# Patient Record
Sex: Female | Born: 1945 | Race: White | Hispanic: No | Marital: Married | Smoking: Former smoker
Health system: Southern US, Community
[De-identification: ages and names within clinical notes are randomized; demographics above are authoritative.]

## PROBLEM LIST (undated history)

## (undated) DIAGNOSIS — IMO0002 Reserved for concepts with insufficient information to code with codable children: Secondary | ICD-10-CM

## (undated) DIAGNOSIS — I872 Venous insufficiency (chronic) (peripheral): Secondary | ICD-10-CM

## (undated) DIAGNOSIS — E1165 Type 2 diabetes mellitus with hyperglycemia: Secondary | ICD-10-CM

## (undated) DIAGNOSIS — E782 Mixed hyperlipidemia: Secondary | ICD-10-CM

## (undated) DIAGNOSIS — F419 Anxiety disorder, unspecified: Secondary | ICD-10-CM

## (undated) DIAGNOSIS — E559 Vitamin D deficiency, unspecified: Secondary | ICD-10-CM

## (undated) DIAGNOSIS — G2581 Restless legs syndrome: Secondary | ICD-10-CM

## (undated) DIAGNOSIS — J45909 Unspecified asthma, uncomplicated: Secondary | ICD-10-CM

## (undated) DIAGNOSIS — I10 Essential (primary) hypertension: Secondary | ICD-10-CM

## (undated) DIAGNOSIS — M8580 Other specified disorders of bone density and structure, unspecified site: Secondary | ICD-10-CM

## (undated) DIAGNOSIS — M797 Fibromyalgia: Secondary | ICD-10-CM

## (undated) DIAGNOSIS — E039 Hypothyroidism, unspecified: Secondary | ICD-10-CM

## (undated) DIAGNOSIS — F1110 Opioid abuse, uncomplicated: Secondary | ICD-10-CM

## (undated) HISTORY — PX: ABDOMINAL HYSTERECTOMY: SHX81

## (undated) HISTORY — PX: TONSILLECTOMY: SUR1361

## (undated) HISTORY — DX: Reserved for concepts with insufficient information to code with codable children: IMO0002

## (undated) HISTORY — PX: OTHER SURGICAL HISTORY: SHX169

## (undated) HISTORY — DX: Other specified disorders of bone density and structure, unspecified site: M85.80

## (undated) HISTORY — DX: Restless legs syndrome: G25.81

## (undated) HISTORY — DX: Type 2 diabetes mellitus with hyperglycemia: E11.65

## (undated) HISTORY — DX: Unspecified asthma, uncomplicated: J45.909

## (undated) HISTORY — DX: Anxiety disorder, unspecified: F41.9

## (undated) HISTORY — DX: Vitamin D deficiency, unspecified: E55.9

## (undated) HISTORY — DX: Mixed hyperlipidemia: E78.2

## (undated) HISTORY — DX: Essential (primary) hypertension: I10

## (undated) HISTORY — DX: Hypothyroidism, unspecified: E03.9

## (undated) HISTORY — DX: Opioid abuse, uncomplicated: F11.10

## (undated) HISTORY — DX: Venous insufficiency (chronic) (peripheral): I87.2

## (undated) HISTORY — PX: CARPAL TUNNEL RELEASE: SHX101

## (undated) HISTORY — DX: Fibromyalgia: M79.7

## (undated) NOTE — *Deleted (*Deleted)
Advice for Weight Management  -For most of us the best way to lose weight is by diet management. Generally speaking, diet management means consuming less calories intentionally which over time brings about progressive weight loss.  This can be achieved more effectively by restricting carbohydrate consumption to the minimum possible.  So, it is critically important to know your numbers: how much calorie you are consuming and how much calorie you need. More importantly, our carbohydrates sources should be unprocessed or minimally processed complex starch food items.   Sometimes, it is important to balance nutrition by increasing protein intake (animal or plant source), fruits, and vegetables.  -Sticking to a routine mealtime to eat 3 meals a day and avoiding unnecessary snacks is shown to have a big role in weight control. Under normal circumstances, the only time we lose real weight is when we are hungry, so allow hunger to take place- hunger means no food between meal times, only water.  It is not advisable to starve.   -It is better to avoid simple carbohydrates including: Cakes, Sweet Desserts, Ice Cream, Soda (diet and regular), Sweet Tea, Candies, Chips, Cookies, Store Bought Juices, Alcohol in Excess of  1-2 drinks a day, Artificial Sweeteners, Doughnuts, Coffee Creamers, "Sugar-free" Products, etc, etc.  This is not a complete list.....    -Consulting with certified diabetes educators is proven to provide you with the most accurate and current information on diet.  Also, you may be  interested in discussing diet options/exchanges , we can schedule a visit with Penny Crumpton, RDN, CDE for individualized nutrition education.  -Exercise: If you are able: 30 -60 minutes a day ,4 days a week, or 150 minutes a week.  The longer the better.  Combine stretch, strength, and aerobic activities.  If you were told in the past that you have high risk for cardiovascular diseases, you may seek evaluation by  your heart doctor prior to initiating moderate to intense exercise programs.    

---

## 2012-10-10 ENCOUNTER — Ambulatory Visit
Admission: RE | Admit: 2012-10-10 | Discharge: 2012-10-10 | Disposition: A | Discharge status: Home / Self Care | Payer: Medicare Other | Source: Ambulatory Visit | Attending: Allergy | Admitting: Allergy

## 2012-10-10 ENCOUNTER — Other Ambulatory Visit: Payer: Self-pay | Admitting: Allergy

## 2012-10-10 DIAGNOSIS — R05 Cough: Secondary | ICD-10-CM

## 2015-02-27 IMAGING — CR DG CHEST 2V
2 series · 2 of 2 positions shown · non-contrast
Comparison: None.

CLINICAL DATA: Chest congestion and cough.

CHEST - 2 VIEW

[w chest pa]
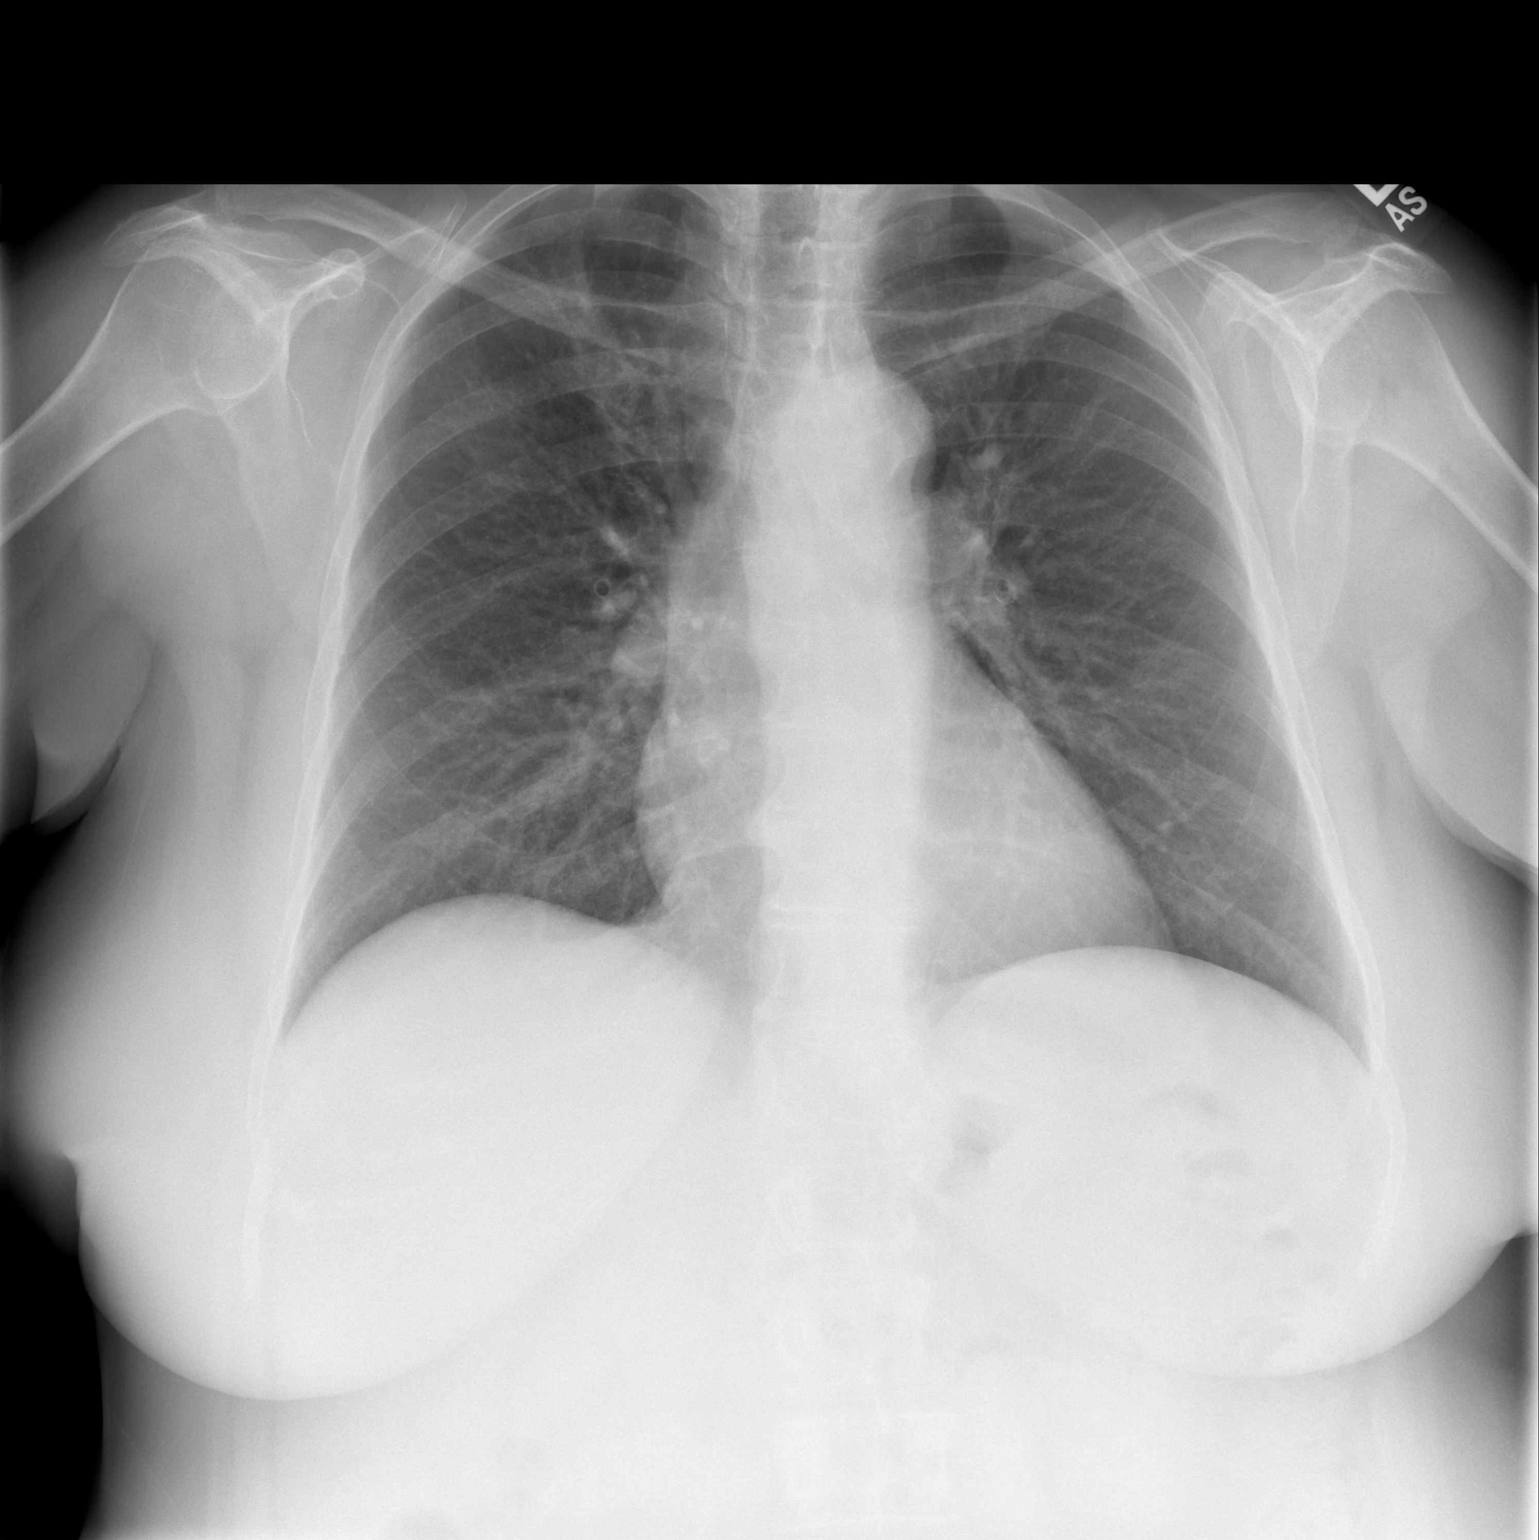

[w chest lat]
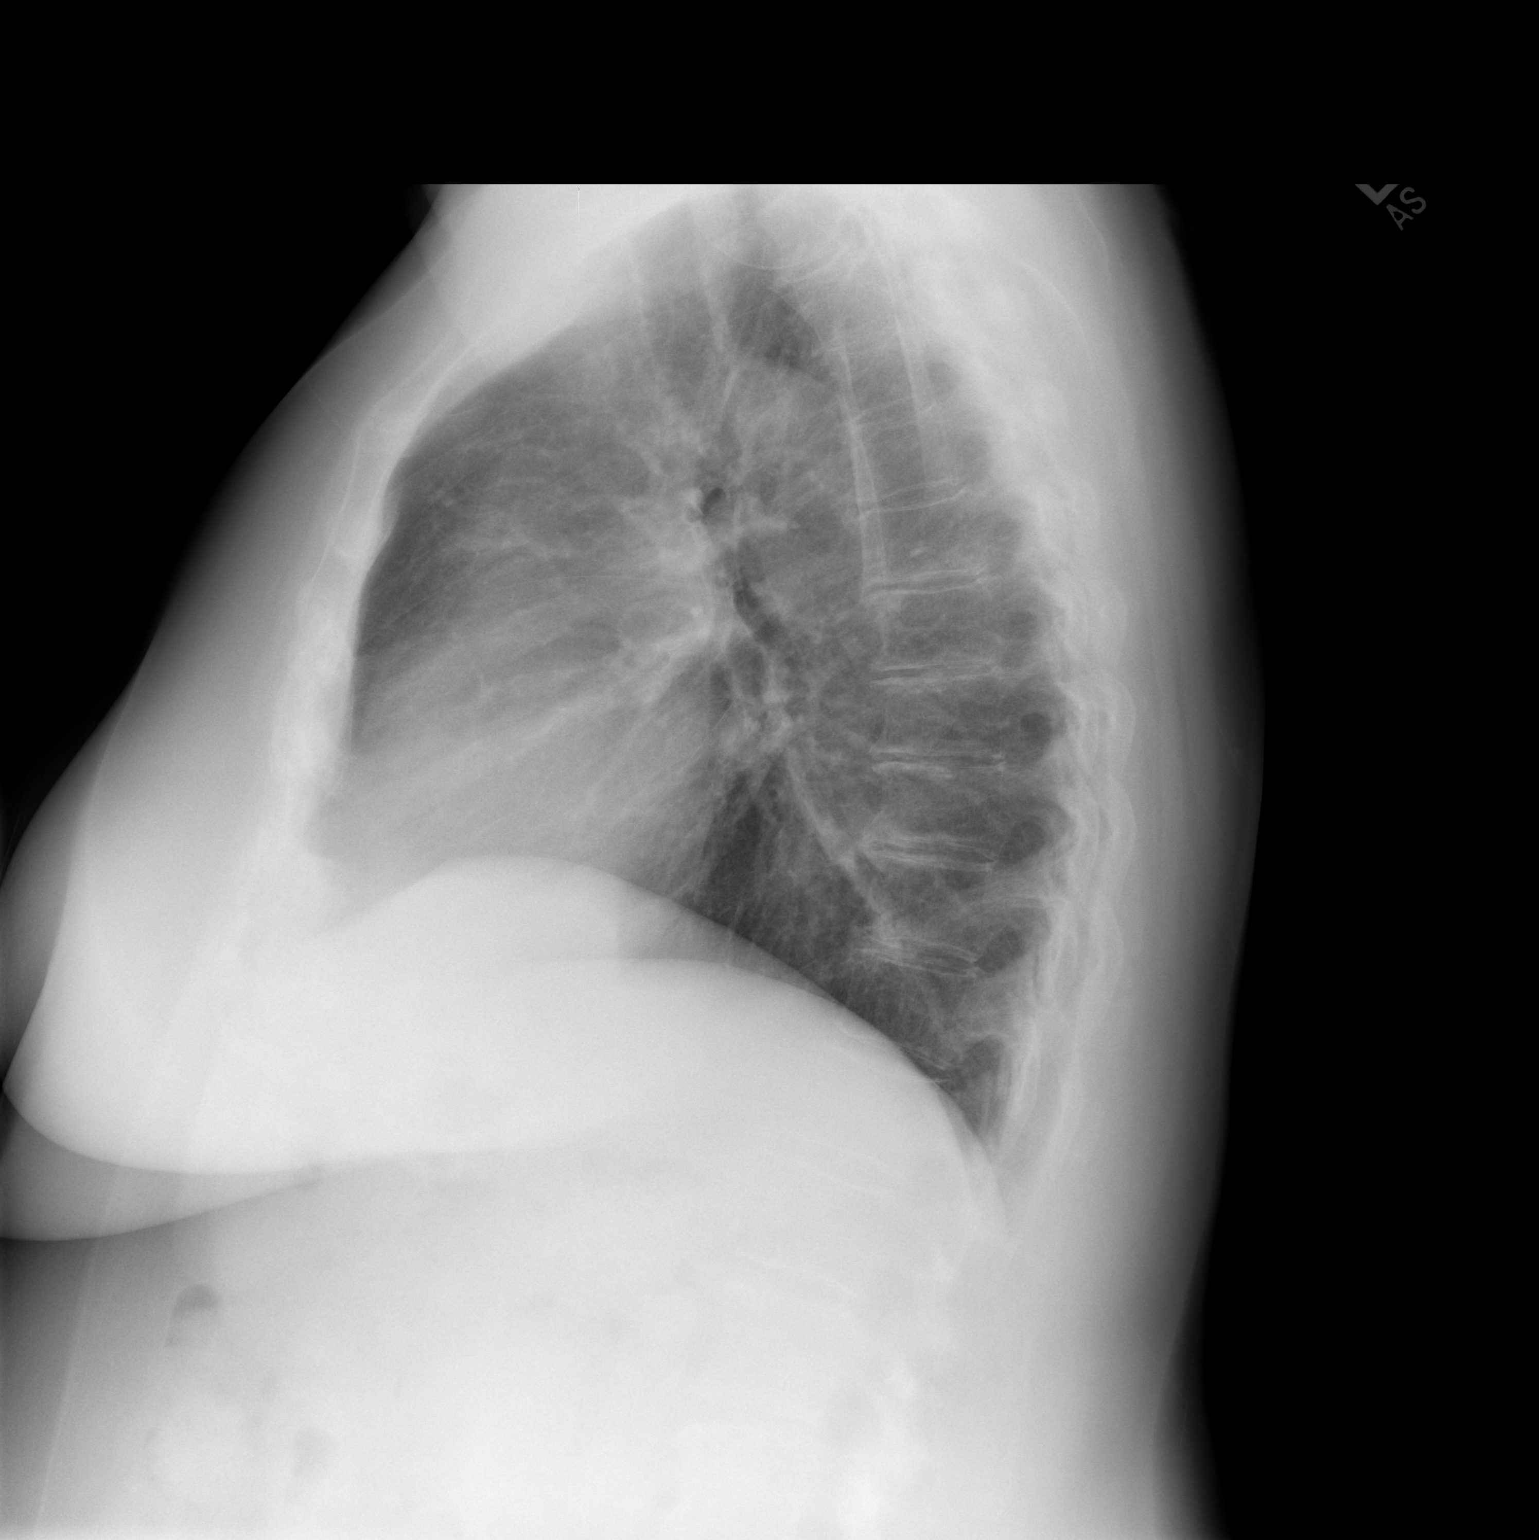

[2 of 2 positions shown; findings below may reference images not displayed]

FINDINGS: The heart size and pulmonary vascularity are normal and
the lungs are clear except for peribronchial thickening.  No
significant osseous abnormality.
IMPRESSION: Bronchitic changes.

## 2018-08-06 LAB — LIPID PANEL
Cholesterol: 221 — AB (ref 0–200)
HDL: 64 (ref 35–70)
LDL Cholesterol: 131
Triglycerides: 144 (ref 40–160)

## 2018-08-06 LAB — HEMOGLOBIN A1C: Hemoglobin A1C: 8.4

## 2018-08-06 LAB — MICROALBUMIN, URINE: Microalb, Ur: 4.3

## 2018-08-06 LAB — TSH: TSH: 2.05 (ref 0.41–5.90)

## 2018-08-15 ENCOUNTER — Encounter: Payer: Self-pay | Admitting: "Endocrinology

## 2018-09-22 ENCOUNTER — Encounter: Payer: Self-pay | Admitting: "Endocrinology

## 2018-09-22 ENCOUNTER — Ambulatory Visit (INDEPENDENT_AMBULATORY_CARE_PROVIDER_SITE_OTHER): Payer: Medicare Other | Admitting: "Endocrinology

## 2018-09-22 ENCOUNTER — Other Ambulatory Visit: Payer: Self-pay

## 2018-09-22 VITALS — BP 122/78 | HR 69 | Ht 64.0 in | Wt 215.0 lb

## 2018-09-22 DIAGNOSIS — E782 Mixed hyperlipidemia: Secondary | ICD-10-CM | POA: Diagnosis not present

## 2018-09-22 DIAGNOSIS — I10 Essential (primary) hypertension: Secondary | ICD-10-CM | POA: Diagnosis not present

## 2018-09-22 DIAGNOSIS — E039 Hypothyroidism, unspecified: Secondary | ICD-10-CM | POA: Diagnosis not present

## 2018-09-22 DIAGNOSIS — E1165 Type 2 diabetes mellitus with hyperglycemia: Secondary | ICD-10-CM | POA: Diagnosis not present

## 2018-09-22 MED ORDER — LEVOTHYROXINE SODIUM 100 MCG PO TABS: 100.0000 ug | ORAL_TABLET | Freq: Every day | ORAL | 3 refills | Status: DC

## 2018-09-22 NOTE — Progress Notes (Signed)
Endocrinology Consult Note       09/22/2018, 5:08 PM   Subjective:    Patient ID: Jaime Waller, female    DOB: 13-Oct-1945.  Jaime Waller is being seen in consultation for management of currently uncontrolled symptomatic diabetes requested by  Pomposini, Jaime Anderson, MD.   Past Medical History:  Diagnosis Date  . Anxiety   . Asthma   . Fibromyalgia   . Hypertension   . Hypothyroidism   . Mixed hyperlipidemia   . Opioid abuse ([place])   . Osteopenia   . Restless leg   . Type II diabetes mellitus, uncontrolled ([place])   . Venous insufficiency   . Vitamin D deficiency      Social History   Socioeconomic History  . Marital status: Married    Spouse name: Not on file  . Number of children: Not on file  . Years of education: Not on file  . Highest education level: Not on file  Occupational History  . Not on file  Social Needs  . Financial resource strain: Not on file  . Food insecurity    Worry: Not on file    Inability: Not on file  . Transportation needs    Medical: Not on file    Non-medical: Not on file  Tobacco Use  . Smoking status: Former Smoker    Packs/day: 1.50    Quit date: 1987    Years since quitting: 33.6  . Smokeless tobacco: Never Used  Substance and Sexual Activity  . Alcohol use: Never    Frequency: Never  . Drug use: Never  . Sexual activity: Not on file  Lifestyle  . Physical activity    Days per week: Not on file    Minutes per session: Not on file  . Stress: Not on file  Relationships  . Social Herbalist on phone: Not on file    Gets together: Not on file    Attends religious service: Not on file    Active member of club or organization: Not on file    Attends meetings of clubs or organizations: Not on file    Relationship status: Not on file  Other Topics Concern  . Not on file  Social History Narrative  . Not on file    Family History   Problem Relation Age of Onset  . Diabetes Mellitus II Mother   . Thyroid disease Mother   . Hypertension Mother   . CAD Mother   . Kidney disease Mother   . Heart failure Mother   . Cancer Mother   . Hypertension Brother   . Diabetes Mellitus II Brother     Outpatient Encounter Medications as of 09/22/2018  Medication Sig  . aspirin EC 81 MG tablet Take 81 mg by mouth daily.  . benzonatate (TESSALON) 200 MG capsule daily as needed.  . DULoxetine (CYMBALTA) 30 MG capsule Take 30 mg by mouth daily.  . Evolocumab (REPATHA SURECLICK) 924 MG/ML SOAJ every 14 (fourteen) days.  Marland Kitchen liraglutide (VICTOZA) 18 MG/3ML SOPN 1.8 mg daily.  Marland Kitchen nystatin cream (MYCOSTATIN) 2 (two) times daily.  Marland Kitchen albuterol (  VENTOLIN HFA) 108 (90 Base) MCG/ACT inhaler 4 (four) times daily as needed.  . ALPRAZolam (XANAX) 0.5 MG tablet daily.  Marland Kitchen. dicyclomine (BENTYL) 10 MG capsule daily.  . famotidine (PEPCID) 20 MG tablet 2 (two) times daily.  . fluticasone (FLONASE) 50 MCG/ACT nasal spray 2 sprays daily.  . furosemide (LASIX) 40 MG tablet daily.  Marland Kitchen. gabapentin (NEURONTIN) 300 MG capsule at bedtime.  Marland Kitchen. HYDROcodone-acetaminophen (NORCO/VICODIN) 5-325 MG tablet 2 (two) times daily.  . insulin degludec (TRESIBA FLEXTOUCH) 100 UNIT/ML SOPN FlexTouch Pen Inject 80 Units into the skin at bedtime.  Marland Kitchen. levocetirizine (XYZAL) 5 MG tablet Take 5 mg by mouth daily as needed.  Marland Kitchen. levothyroxine (SYNTHROID) 100 MCG tablet Take 1 tablet (100 mcg total) by mouth daily before breakfast.  . meloxicam (MOBIC) 7.5 MG tablet Take 7.5 mg by mouth daily.  . metFORMIN (GLUCOPHAGE) 1000 MG tablet Take 1,000 mg by mouth 2 (two) times daily.  . metoprolol tartrate (LOPRESSOR) 25 MG tablet Take 25 mg by mouth 2 (two) times daily with a meal.  . nitrofurantoin (MACRODANTIN) 50 MG capsule Take 50 mg by mouth daily.  . pantoprazole (PROTONIX) 40 MG tablet Take 40 mg by mouth daily.  . phenazopyridine (PYRIDIUM) 200 MG tablet Take 200 mg by mouth as  needed.  . pramipexole (MIRAPEX) 0.125 MG tablet 2 tablets at bedtime as needed.  . predniSONE (DELTASONE) 5 MG tablet Take 5 mg by mouth daily as needed.  . STOOL SOFTENER 100 MG capsule 2 capsules daily.  Marland Kitchen. triamterene-hydrochlorothiazide (DYAZIDE) 37.5-25 MG capsule Take 1 capsule by mouth daily.  . Vitamin D, Ergocalciferol, (DRISDOL) 1.25 MG (50000 UT) CAPS capsule Take 50,000 Units by mouth once a week.  . [DISCONTINUED] glimepiride (AMARYL) 2 MG tablet Take 2 mg by mouth daily.  . [DISCONTINUED] SYNTHROID 75 MCG tablet Take 75 mcg by mouth daily.   No facility-administered encounter medications on file as of 09/22/2018.     ALLERGIES: Allergies  Allergen Reactions  . Amoxicillin   . Dapagliflozin   . Ezetimibe   . Hydroxychloroquine   . Lisinopril Swelling  . Metronidazole   . Naproxen   . Pioglitazone   . Plaquenil  [Hydroxychloroquine Sulfate]   . Prednisone   . Statins     VACCINATION STATUS:  There is no immunization history on file for this patient.  Diabetes She presents for her initial diabetic visit. She has type 2 diabetes mellitus. Onset time: She was diagnosed at approximate age of 73 years.  She has a previous history of gestational diabetes. Her disease course has been worsening. There are no hypoglycemic associated symptoms. Pertinent negatives for hypoglycemia include no confusion, headaches, pallor or seizures. Associated symptoms include fatigue, polydipsia and polyuria. Pertinent negatives for diabetes include no chest pain and no polyphagia. There are no hypoglycemic complications. Symptoms are worsening. Diabetic complications include peripheral neuropathy. Risk factors for coronary artery disease include diabetes mellitus, dyslipidemia, family history, hypertension, obesity, sedentary lifestyle, post-menopausal and tobacco exposure. Current diabetic treatment includes insulin injections and oral agent (monotherapy) (She is currently on Tresiba 80 units  nightly, Victoza 1.8 mg daily, metformin 1000 mg p.o. daily, and glimepiride 2 mg.). Her weight is increasing steadily. She is following a generally unhealthy diet. When asked about meal planning, she reported none. She has not had a previous visit with a dietitian. She never participates in exercise. (She did not bring any logs nor meter with her today.  Her most recent A1c was 8.4% on August 06, 2018.)  An ACE inhibitor/angiotensin II receptor blocker is contraindicated. Eye exam is current.  Thyroid Problem Presents for initial visit. Onset time: She is saying that she was diagnosed with hypothyroidism more than 20 years ago, has been on her current dose of levothyroxine 75 mcg for more than 5 years. Symptoms include fatigue. Patient reports no cold intolerance, diarrhea, heat intolerance or palpitations. Her past medical history is significant for diabetes, hyperlipidemia, neuropathy and obesity. Risk factors include family history of hypothyroidism.  Hyperlipidemia This is a chronic problem. The current episode started more than 1 year ago. Exacerbating diseases include diabetes, hypothyroidism and obesity. Pertinent negatives include no chest pain, myalgias or shortness of breath. Treatments tried: Reportedly she does not tolerate statins.  She is currently on Repatha 140 mg subcutaneously every 2 weeks. Risk factors for coronary artery disease include diabetes mellitus, dyslipidemia, obesity, a sedentary lifestyle, post-menopausal, hypertension and family history.     Review of Systems  Constitutional: Positive for fatigue. Negative for chills, fever and unexpected weight change.  HENT: Negative for trouble swallowing and voice change.   Eyes: Negative for visual disturbance.  Respiratory: Negative for cough, shortness of breath and wheezing.   Cardiovascular: Negative for chest pain, palpitations and leg swelling.  Gastrointestinal: Negative for diarrhea, nausea and vomiting.  Endocrine: Positive  for polydipsia and polyuria. Negative for cold intolerance, heat intolerance and polyphagia.  Musculoskeletal: Negative for arthralgias and myalgias.  Skin: Negative for color change, pallor, rash and wound.  Neurological: Negative for seizures and headaches.  Psychiatric/Behavioral: Negative for confusion and suicidal ideas.    Objective:    BP 122/78   Pulse 69   Ht 5\' 4"  (1.626 m)   Wt 215 lb (97.5 kg)   BMI 36.90 kg/m   Wt Readings from Last 3 Encounters:  09/22/18 215 lb (97.5 kg)     Physical Exam Constitutional:      Appearance: She is well-developed.  HENT:     Head: Normocephalic and atraumatic.  Neck:     Musculoskeletal: Normal range of motion and neck supple.     Thyroid: No thyromegaly.     Trachea: No tracheal deviation.  Cardiovascular:     Rate and Rhythm: Normal rate and regular rhythm.  Pulmonary:     Effort: Pulmonary effort is normal.  Abdominal:     Tenderness: There is no abdominal tenderness. There is no guarding.  Musculoskeletal: Normal range of motion.  Skin:    General: Skin is warm and dry.     Coloration: Skin is not pale.     Findings: No erythema or rash.  Neurological:     Mental Status: She is alert and oriented to person, place, and time.     Cranial Nerves: No cranial nerve deficit.     Coordination: Coordination normal.     Deep Tendon Reflexes: Reflexes are normal and symmetric.  Psychiatric:        Judgment: Judgment normal.    Recent Results (from the past 2160 hour(s))  Microalbumin, urine     Status: None   Collection Time: 08/06/18 12:00 AM  Result Value Ref Range   Microalb, Ur 4.3   Lipid panel     Status: Abnormal   Collection Time: 08/06/18 12:00 AM  Result Value Ref Range   Triglycerides 144 40 - 160   Cholesterol 221 (A) 0 - 200   HDL 64 35 - 70   LDL Cholesterol 131   Hemoglobin A1c     Status: None  Collection Time: 08/06/18 12:00 AM  Result Value Ref Range   Hemoglobin A1C 8.4   TSH     Status: None    Collection Time: 08/06/18 12:00 AM  Result Value Ref Range   TSH 2.05 0.41 - 5.90    Comment: [id number]      Assessment & Plan:   1. Uncontrolled type 2 diabetes mellitus with hyperglycemia (HCC)   - Lanier PrudeGloria Egli has currently uncontrolled symptomatic type 2 DM since  73 years of age,  with most recent A1c of 8.4 %. Recent labs reviewed. - I had a long discussion with her about the progressive nature of diabetes and the pathology behind its complications. -her diabetes is complicated by obesity/sedentary life, peripheral neuropathy and she remains at a high risk for more acute and chronic complications which include CAD, CVA, CKD, retinopathy, and neuropathy. These are all discussed in detail with her.  - I have counseled her on diet management and weight loss, by adopting a carbohydrate restricted/protein rich diet. - she admits that there is a room for improvement in her food and drink choices. - Suggestion is made for her to avoid simple carbohydrates  from her diet including Cakes, Sweet Desserts, Ice Cream, Soda (diet and regular), Sweet Tea, Candies, Chips, Cookies, Store Bought Juices, Alcohol in Excess of  1-2 drinks a day, Artificial Sweeteners,  Coffee Creamer, and "Sugar-free" Products. This will help patient to have more stable blood glucose profile and potentially avoid unintended weight gain.  - I encouraged her to switch to  unprocessed or minimally processed complex starch and increased protein intake (animal or plant source), fruits, and vegetables.  - she is advised to stick to a routine mealtimes to eat 3 meals  a day and avoid unnecessary snacks ( to snack only to correct hypoglycemia).   - she will be scheduled with Norm SaltPenny Crumpton, RDN, CDE for diabetes education.  - I have approached her with the following individualized plan to manage  her diabetes and patient agrees:   - she is on a polypharmacy, may benefit from simplified treatment regimen.   -She will be  continued on her basal insulin Tresiba 80 units nightly, continue Victoza 1.8 mg subcutaneously daily, continue metformin 1000 mg p.o. twice daily after breakfast and after supper.  She is advised to discontinue glimepiride.    -She is approached for strict monitoring of glucose 4 times a day-before meals and at bedtime, and reevaluation in 10 days with her meter and logs. - she is warned not to take insulin without proper monitoring per orders.  - she is encouraged to call clinic for blood glucose levels less than 70 or above 300 mg /dl.  - Patient specific target  A1c;  LDL, HDL, Triglycerides, and  Waist Circumference were discussed in detail.  2) Blood Pressure /Hypertension:  her blood pressure is  controlled to target.   she is advised to continue her current medications including Dyazide 37.5-25 mg p.o. daily with breakfast, metoprolol 25 mg p.o. twice daily, she documented adverse reactions for lisinopril    3) Lipids/Hyperlipidemia:   Review of her recent lipid panel showed un controlled  LDL at 131 .  she does not tolerate statins, advised to continue Repatha 140 mg subcutaneously every other week.    4)  Weight/Diet:  Body mass index is 36.9 kg/m.  -  clearly complicating her diabetes care.  I discussed with her the fact that loss of 5 - 10% of her  current  body weight will have the most impact on her diabetes management.  CDE Consult will be initiated . Exercise, and detailed carbohydrates information provided  -  detailed on discharge instructions.  5) hypothyroidism-longstanding  Her previsit labs show evidence of inadequate replacement.  I discussed increase her thyroid to 100 mcg p.o. daily before breakfast.   - We discussed about the correct intake of her thyroid hormone, on empty stomach at fasting, with water, separated by at least 30 minutes from breakfast and other medications,  and separated by more than 4 hours from calcium, iron, multivitamins, acid reflux medications  (PPIs). -Patient is made aware of the fact that thyroid hormone replacement is needed for life, dose to be adjusted by periodic monitoring of thyroid function tests.   6) Chronic Care/Health Maintenance:  -she  Is not on ACEI/ARB and Statin medications and  is encouraged to initiate and continue to follow up with Ophthalmology, Dentist,  Podiatrist at least yearly or according to recommendations, and advised to  stay away from smoking. I have recommended yearly flu vaccine and pneumonia vaccine at least every 5 years; moderate intensity exercise for up to 150 minutes weekly; and  sleep for at least 7 hours a day.  - she is  advised to maintain close follow up with Pomposini, Rande Bruntaniel L, MD for primary care needs, as well as her other providers for optimal and coordinated care.  - Time spent with the patient: 45 minutes, of which >50% was spent in obtaining information about her symptoms, reviewing her previous labs/studies, evaluations, and treatments, counseling her about her currently uncontrolled type 2 diabetes, hypothyroidism, hyperlipidemia, hypertension, and developing plans for long term treatment based on the latest standards of care/guidelines.  Please refer to " Patient Self Inventory" in the Media  tab for reviewed elements of pertinent patient history.  Lanier PrudeGloria Kalina participated in the discussions, expressed understanding, and voiced agreement with the above plans.  All questions were answered to her satisfaction. she is encouraged to contact clinic should she have any questions or concerns prior to her return visit.  Follow up plan: - Return in about 10 days (around 10/02/2018) for Follow up with Meter and Logs Only - no Labs.  Marquis LunchGebre Cherissa Hook, MD [facility] [address] [place], [place] [address] Phone: [phone]  Fax: [phone]    09/22/2018, 5:08 PM  This note was partially dictated with voice recognition software. Similar  sounding words can be transcribed inadequately or may not  be corrected upon review.

## 2018-09-22 NOTE — Patient Instructions (Signed)

## 2018-10-04 ENCOUNTER — Encounter: Payer: Self-pay | Admitting: "Endocrinology

## 2018-10-04 ENCOUNTER — Ambulatory Visit (INDEPENDENT_AMBULATORY_CARE_PROVIDER_SITE_OTHER): Payer: Medicare Other | Admitting: "Endocrinology

## 2018-10-04 ENCOUNTER — Other Ambulatory Visit: Payer: Self-pay

## 2018-10-04 DIAGNOSIS — E1165 Type 2 diabetes mellitus with hyperglycemia: Secondary | ICD-10-CM | POA: Diagnosis not present

## 2018-10-04 DIAGNOSIS — E039 Hypothyroidism, unspecified: Secondary | ICD-10-CM

## 2018-10-04 DIAGNOSIS — E782 Mixed hyperlipidemia: Secondary | ICD-10-CM

## 2018-10-04 DIAGNOSIS — I10 Essential (primary) hypertension: Secondary | ICD-10-CM

## 2018-10-04 NOTE — Progress Notes (Signed)
10/04/2018, 4:30 PM                                                    Endocrinology Telehealth Visit Follow up Note -During COVID -19 Pandemic  This visit type was conducted due to national recommendations for restrictions regarding the COVID-19 Pandemic  in an effort to limit this patient's exposure and mitigate transmission of the corona virus.  Due to her co-morbid illnesses, Jaime Waller is at  moderate to high risk for complications without adequate follow up.  This format is felt to be most appropriate for her at this time.  I connected with this patient on 10/04/2018   by telephone and verified that I am speaking with the correct person using two identifiers. Jaime Waller, [birth year]. she has verbally consented to this visit. All issues noted in this document were discussed and addressed. The format was not optimal for physical exam.    Subjective:    Patient ID: Jaime Waller, female    DOB: Apr 05, 1945.  Jaime Waller is being engaged in telehealth via telephone after she was seen in consultation for  management of currently uncontrolled symptomatic diabetes requested by  Pomposini, Rande Brunt, MD.   Past Medical History:  Diagnosis Date  . Anxiety   . Asthma   . Fibromyalgia   . Hypertension   . Hypothyroidism   . Mixed hyperlipidemia   . Opioid abuse (HCC)   . Osteopenia   . Restless leg   . Type II diabetes mellitus, uncontrolled (HCC)   . Venous insufficiency   . Vitamin D deficiency      Social History   Socioeconomic History  . Marital status: Married    Spouse name: Not on file  . Number of children: Not on file  . Years of education: Not on file  . Highest education level: Not on file  Occupational History  . Not on file  Social Needs  . Financial resource strain: Not on file  . Food insecurity    Worry: Not on file    Inability: Not on file  . Transportation needs    Medical:  Not on file    Non-medical: Not on file  Tobacco Use  . Smoking status: Former Smoker    Packs/day: 1.50    Quit date: 1987    Years since quitting: 33.6  . Smokeless tobacco: Never Used  Substance and Sexual Activity  . Alcohol use: Never    Frequency: Never  . Drug use: Never  . Sexual activity: Not on file  Lifestyle  . Physical activity    Days per week: Not on file    Minutes per session: Not on file  . Stress: Not on file  Relationships  . Social Musician on phone: Not on file    Gets together: Not on file    Attends religious service: Not on file    Active member of club or organization: Not on file    Attends meetings of  clubs or organizations: Not on file    Relationship status: Not on file  Other Topics Concern  . Not on file  Social History Narrative  . Not on file    Family History  Problem Relation Age of Onset  . Diabetes Mellitus II Mother   . Thyroid disease Mother   . Hypertension Mother   . CAD Mother   . Kidney disease Mother   . Heart failure Mother   . Cancer Mother   . Hypertension Brother   . Diabetes Mellitus II Brother     Outpatient Encounter Medications as of 10/04/2018  Medication Sig  . albuterol (VENTOLIN HFA) 108 (90 Base) MCG/ACT inhaler 4 (four) times daily as needed.  . ALPRAZolam (XANAX) 0.5 MG tablet daily.  Marland Kitchen. aspirin EC 81 MG tablet Take 81 mg by mouth daily.  . benzonatate (TESSALON) 200 MG capsule daily as needed.  . dicyclomine (BENTYL) 10 MG capsule daily.  . DULoxetine (CYMBALTA) 30 MG capsule Take 30 mg by mouth daily.  . Evolocumab (REPATHA SURECLICK) 140 MG/ML SOAJ every 14 (fourteen) days.  . famotidine (PEPCID) 20 MG tablet 2 (two) times daily.  . fluticasone (FLONASE) 50 MCG/ACT nasal spray 2 sprays daily.  . furosemide (LASIX) 40 MG tablet daily.  Marland Kitchen. gabapentin (NEURONTIN) 300 MG capsule at bedtime.  Marland Kitchen. HYDROcodone-acetaminophen (NORCO/VICODIN) 5-325 MG tablet 2 (two) times daily.  . insulin degludec  (TRESIBA FLEXTOUCH) 100 UNIT/ML SOPN FlexTouch Pen Inject 80 Units into the skin at bedtime.  Marland Kitchen. levocetirizine (XYZAL) 5 MG tablet Take 5 mg by mouth daily as needed.  Marland Kitchen. levothyroxine (SYNTHROID) 100 MCG tablet Take 1 tablet (100 mcg total) by mouth daily before breakfast.  . liraglutide (VICTOZA) 18 MG/3ML SOPN 1.8 mg daily.  . meloxicam (MOBIC) 7.5 MG tablet Take 7.5 mg by mouth daily.  . metFORMIN (GLUCOPHAGE) 1000 MG tablet Take 1,000 mg by mouth 2 (two) times daily.  . metoprolol tartrate (LOPRESSOR) 25 MG tablet Take 25 mg by mouth 2 (two) times daily with a meal.  . nitrofurantoin (MACRODANTIN) 50 MG capsule Take 50 mg by mouth daily.  Marland Kitchen. nystatin cream (MYCOSTATIN) 2 (two) times daily.  . pantoprazole (PROTONIX) 40 MG tablet Take 40 mg by mouth daily.  . phenazopyridine (PYRIDIUM) 200 MG tablet Take 200 mg by mouth as needed.  . pramipexole (MIRAPEX) 0.125 MG tablet 2 tablets at bedtime as needed.  . predniSONE (DELTASONE) 5 MG tablet Take 5 mg by mouth daily as needed.  . STOOL SOFTENER 100 MG capsule 2 capsules daily.  Marland Kitchen. triamterene-hydrochlorothiazide (DYAZIDE) 37.5-25 MG capsule Take 1 capsule by mouth daily.  . Vitamin D, Ergocalciferol, (DRISDOL) 1.25 MG (50000 UT) CAPS capsule Take 50,000 Units by mouth once a week.   No facility-administered encounter medications on file as of 10/04/2018.     ALLERGIES: Allergies  Allergen Reactions  . Amoxicillin   . Dapagliflozin   . Ezetimibe   . Hydroxychloroquine   . Lisinopril Swelling  . Metronidazole   . Naproxen   . Pioglitazone   . Plaquenil  [Hydroxychloroquine Sulfate]   . Prednisone   . Statins     VACCINATION STATUS:  There is no immunization history on file for this patient.  Diabetes She presents for her follow-up diabetic visit. She has type 2 diabetes mellitus. Onset time: She was diagnosed at approximate age of 73 years.  She has a previous history of gestational diabetes. Her disease course has been  improving. There are no hypoglycemic associated symptoms.  Pertinent negatives for hypoglycemia include no confusion, headaches, pallor or seizures. Pertinent negatives for diabetes include no chest pain, no fatigue, no polydipsia, no polyphagia and no polyuria. There are no hypoglycemic complications. Symptoms are improving. Diabetic complications include peripheral neuropathy. Risk factors for coronary artery disease include diabetes mellitus, dyslipidemia, family history, hypertension, obesity, sedentary lifestyle, post-menopausal and tobacco exposure. Current diabetic treatment includes insulin injections and oral agent (monotherapy) (She is currently on Tresiba 80 units nightly, Victoza 1.8 mg daily, metformin 1000 mg p.o. daily, and glimepiride 2 mg.). Her weight is increasing steadily. She is following a generally unhealthy diet. When asked about meal planning, she reported none. She has not had a previous visit with a dietitian. She never participates in exercise. (She is reporting improved glycemic profile, fasting between 99-182, prelunch between 127-252, presupper between 109-271, bedtime between 133-297. She denies hypoglycemia.  A1c prior to her last visit was 8.4%.  ) An ACE inhibitor/angiotensin II receptor blocker is contraindicated. Eye exam is current.  Thyroid Problem Presents for initial visit. Onset time: She is saying that she was diagnosed with hypothyroidism more than 20 years ago, has been on her current dose of levothyroxine 75 mcg for more than 5 years. Patient reports no cold intolerance, diarrhea, fatigue, heat intolerance or palpitations. Her past medical history is significant for diabetes, hyperlipidemia, neuropathy and obesity. Risk factors include family history of hypothyroidism.  Hyperlipidemia This is a chronic problem. The current episode started more than 1 year ago. Exacerbating diseases include diabetes, hypothyroidism and obesity. Pertinent negatives include no chest  pain, myalgias or shortness of breath. Treatments tried: Reportedly she does not tolerate statins.  She is currently on Repatha 140 mg subcutaneously every 2 weeks. Risk factors for coronary artery disease include diabetes mellitus, dyslipidemia, obesity, a sedentary lifestyle, post-menopausal, hypertension and family history.     Review of Systems  Constitutional: Negative for chills, fatigue, fever and unexpected weight change.  HENT: Negative for trouble swallowing and voice change.   Eyes: Negative for visual disturbance.  Respiratory: Negative for cough, shortness of breath and wheezing.   Cardiovascular: Negative for chest pain, palpitations and leg swelling.  Gastrointestinal: Negative for diarrhea, nausea and vomiting.  Endocrine: Negative for cold intolerance, heat intolerance, polydipsia, polyphagia and polyuria.  Musculoskeletal: Negative for arthralgias and myalgias.  Skin: Negative for color change, pallor, rash and wound.  Neurological: Negative for seizures and headaches.  Psychiatric/Behavioral: Negative for confusion and suicidal ideas.    Objective:    There were no vitals taken for this visit.  Wt Readings from Last 3 Encounters:  09/22/18 215 lb (97.5 kg)     Physical Exam Constitutional:      Appearance: She is well-developed.  HENT:     Head: Normocephalic and atraumatic.  Neck:     Musculoskeletal: Normal range of motion and neck supple.     Thyroid: No thyromegaly.     Trachea: No tracheal deviation.  Cardiovascular:     Rate and Rhythm: Normal rate and regular rhythm.  Pulmonary:     Effort: Pulmonary effort is normal.  Abdominal:     Tenderness: There is no abdominal tenderness. There is no guarding.  Musculoskeletal: Normal range of motion.  Skin:    General: Skin is warm and dry.     Coloration: Skin is not pale.     Findings: No erythema or rash.  Neurological:     Mental Status: She is alert and oriented to person, place, and time.  Cranial Nerves: No cranial nerve deficit.     Coordination: Coordination normal.     Deep Tendon Reflexes: Reflexes are normal and symmetric.  Psychiatric:        Judgment: Judgment normal.    Recent Results (from the past 2160 hour(s))  Microalbumin, urine     Status: None   Collection Time: 08/06/18 12:00 AM  Result Value Ref Range   Microalb, Ur 4.3   Lipid panel     Status: Abnormal   Collection Time: 08/06/18 12:00 AM  Result Value Ref Range   Triglycerides 144 40 - 160   Cholesterol 221 (A) 0 - 200   HDL 64 35 - 70   LDL Cholesterol 131   Hemoglobin A1c     Status: None   Collection Time: 08/06/18 12:00 AM  Result Value Ref Range   Hemoglobin A1C 8.4   TSH     Status: None   Collection Time: 08/06/18 12:00 AM  Result Value Ref Range   TSH 2.05 0.41 - 5.90    Comment: [id number]      Assessment & Plan:   1. Uncontrolled type 2 diabetes mellitus with hyperglycemia (HCC)   - Jaime Waller has currently uncontrolled symptomatic type 2 DM since  73 years of age.  She reports improving glycemic profile to near target ranges, A1c prior to her last visit was 8.4%.  ,  with most recent A1c of 8.4 %. Recent labs reviewed. - I had a long discussion with her about the progressive nature of diabetes and the pathology behind its complications. -her diabetes is complicated by obesity/sedentary life, peripheral neuropathy and she remains at a high risk for more acute and chronic complications which include CAD, CVA, CKD, retinopathy, and neuropathy. These are all discussed in detail with her.  - I have counseled her on diet management and weight loss, by adopting a carbohydrate restricted/protein rich diet. - she admits that there is a room for improvement in her food and drink choices. - Suggestion is made for her to avoid simple carbohydrates  from her diet including Cakes, Sweet Desserts, Ice Cream, Soda (diet and regular), Sweet Tea, Candies, Chips, Cookies, Store Bought Juices,  Alcohol in Excess of  1-2 drinks a day, Artificial Sweeteners,  Coffee Creamer, and "Sugar-free" Products. This will help patient to have more stable blood glucose profile and potentially avoid unintended weight gain.  - I encouraged her to switch to  unprocessed or minimally processed complex starch and increased protein intake (animal or plant source), fruits, and vegetables.  - she is advised to stick to a routine mealtimes to eat 3 meals  a day and avoid unnecessary snacks ( to snack only to correct hypoglycemia).   - I have approached her with the following individualized plan to manage  her diabetes and patient agrees:   - she will continue to benefit from simplified treatment regimen. -She will be continued on her basal insulin Tresiba 80 units nightly, continue Victoza 1.8 mg subcutaneously daily, continue metformin 1000 mg p.o. twice daily after breakfast and after supper.  She has received steroid injections for treating arthritis, advised to take glimepiride 2 mg p.o. with breakfast x next 3 days.   -She is approached for strict monitoring of glucose 2 times a day-before breakfast and at bedtime.  - she is warned not to take insulin without proper monitoring per orders.  - she is encouraged to call clinic for blood glucose levels less than 70 or above 300  mg /dl.  - Patient specific target  A1c;  LDL, HDL, Triglycerides, and  Waist Circumference were discussed in detail.  2) Blood Pressure /Hypertension: she is advised to home monitor blood pressure and report if > 140/90 on 2 separate readings.   she is advised to continue her current medications including Dyazide 37.5-25 mg p.o. daily with breakfast, metoprolol 25 mg p.o. twice daily, she documented adverse reactions for lisinopril    3) Lipids/Hyperlipidemia:   Review of her recent lipid panel showed un controlled  LDL at 131 .  she does not tolerate statins, advised to continue Repatha 140 mg subcutaneously every other week.     4)  Weight/Diet:   -  clearly complicating her diabetes care.  I discussed with her the fact that loss of 5 - 10% of her  current body weight will have the most impact on her diabetes management.  CDE Consult will be initiated . Exercise, and detailed carbohydrates information provided  -  detailed on discharge instructions.  5) hypothyroidism-longstanding  Her previsit labs show evidence of inadequate replacement.  Her levothyroxine was increased to 100 mcg daily before breakfast and during her last visit.   - We discussed about the correct intake of her thyroid hormone, on empty stomach at fasting, with water, separated by at least 30 minutes from breakfast and other medications,  and separated by more than 4 hours from calcium, iron, multivitamins, acid reflux medications (PPIs). -Patient is made aware of the fact that thyroid hormone replacement is needed for life, dose to be adjusted by periodic monitoring of thyroid function tests.    6) Chronic Care/Health Maintenance:  -she  Is not on ACEI/ARB and Statin medications and  is encouraged to initiate and continue to follow up with Ophthalmology, Dentist,  Podiatrist at least yearly or according to recommendations, and advised to  stay away from smoking. I have recommended yearly flu vaccine and pneumonia vaccine at least every 5 years; moderate intensity exercise for up to 150 minutes weekly; and  sleep for at least 7 hours a day.  - she is  advised to maintain close follow up with Pomposini, Rande Bruntaniel L, MD for primary care needs, as well as her other providers for optimal and coordinated care.  - Patient Care Time Today:  25 min, of which >50% was spent in  counseling and the rest reviewing her  current and  previous labs/studies, previous treatments, her blood glucose readings, and medications' doses and developing a plan for long-term care based on the latest recommendations for standards of care.   Jaime Waller participated in the  discussions, expressed understanding, and voiced agreement with the above plans.  All questions were answered to her satisfaction. she is encouraged to contact clinic should she have any questions or concerns prior to her return visit.   Follow up plan: - Return in about 9 weeks (around 12/06/2018) for Bring Meter and Logs- A1c in Office.  Jaime LunchGebre Oceania Noori, MD [facility] [address] [place], [place] [address] Phone: [phone]  Fax: [phone]    10/04/2018, 4:30 PM  This note was partially dictated with voice recognition software. Similar sounding words can be transcribed inadequately or may not  be corrected upon review.

## 2018-10-05 ENCOUNTER — Ambulatory Visit: Payer: Medicare Other | Admitting: "Endocrinology

## 2018-10-13 ENCOUNTER — Encounter: Payer: Medicare Other | Admitting: Nutrition

## 2018-11-03 ENCOUNTER — Encounter: Payer: Medicare Other | Attending: "Endocrinology | Admitting: Nutrition

## 2018-11-03 ENCOUNTER — Other Ambulatory Visit: Payer: Self-pay

## 2018-12-07 ENCOUNTER — Encounter: Payer: Self-pay | Admitting: "Endocrinology

## 2018-12-07 ENCOUNTER — Encounter: Payer: Self-pay | Admitting: Nutrition

## 2018-12-07 ENCOUNTER — Ambulatory Visit (INDEPENDENT_AMBULATORY_CARE_PROVIDER_SITE_OTHER): Payer: Medicare Other | Admitting: "Endocrinology

## 2018-12-07 ENCOUNTER — Encounter: Payer: Medicare Other | Attending: "Endocrinology | Admitting: Nutrition

## 2018-12-07 ENCOUNTER — Other Ambulatory Visit: Payer: Self-pay

## 2018-12-07 VITALS — Ht 64.0 in | Wt 219.0 lb

## 2018-12-07 VITALS — BP 136/66 | HR 92 | Ht 64.0 in | Wt 219.0 lb

## 2018-12-07 DIAGNOSIS — E1165 Type 2 diabetes mellitus with hyperglycemia: Secondary | ICD-10-CM | POA: Diagnosis present

## 2018-12-07 DIAGNOSIS — E782 Mixed hyperlipidemia: Secondary | ICD-10-CM

## 2018-12-07 DIAGNOSIS — E039 Hypothyroidism, unspecified: Secondary | ICD-10-CM

## 2018-12-07 DIAGNOSIS — I10 Essential (primary) hypertension: Secondary | ICD-10-CM | POA: Diagnosis not present

## 2018-12-07 MED ORDER — GLIPIZIDE ER 2.5 MG PO TB24: 2.5000 mg | ORAL_TABLET | Freq: Every day | ORAL | 3 refills | Status: DC

## 2018-12-07 NOTE — Progress Notes (Signed)
  Medical Nutrition Therapy:  Appt start time: 1500 end time:  1600.   Assessment:  Primary concerns today: Diabetes Type 2, Obesity.  First visit with a dietitian. Here with her daughter, Jaime Waller. She lives with her daughter and family with her husband..Sees Dr. Dorris Fetch, Endocrinology. Glipizide, 80 units of Tresiba, Metformin 1000 mg BID. Eats 1-2 meals per day and usually snacks some.. Doesn't eat a lot at one time. Eating is excessive for her needs contributing to her elevated BS and weight. Willing to make changes with diet and food choices.  Lab Results  Component Value Date   HGBA1C 7.9 (A) 12/08/2018   Preferred Learning Style:  Auditory  Visual  Hands on  Learning Readiness:    Ready  Change in progress   MEDICATIONS:   DIETARY INTAKE:    24-hr recall:  B ( AM): skips,  Snk ( AM):   L ( PM): Cheeseburger with l/t/m,  Coffeer creamer Snk ( PM):  D ( PM): skipped  Snk ( PM): Beverages: coffee, Sweet tea  Usual physical activity:   Estimated energy needs: 1200  calories 133g carbohydrates 90 g protein 33 g fat  Progress Towards Goal(s):  In progress.   Nutritional Diagnosis:  NB-1.1 Food and nutrition-related knowledge deficit As related to DIabetes Type 2.  As evidenced by A1c 7.9%.    Intervention:  Nutrition and Diabetes education provided on My Plate, CHO counting, meal planning, portion sizes, timing of meals, avoiding snacks between meals unless having a low blood sugar, target ranges for A1C and blood sugars, signs/symptoms and treatment of hyper/hypoglycemia, monitoring blood sugars, taking medications as prescribed, benefits of exercising 30 minutes per day and prevention of complications of DM.  Goals Follow My Plate Dont skip meals.  Eat three balanced meals per day at times discussed. Cut out snacks Drink only water Don't eat past 7 pm. Goal FBS 130 mg or less and less than 150 mg/dl at bedtime. Get A1C to 7% Take insulin and  meds as prescribed.  Teaching Method Utilized:  Visual Auditory Hands on  Handouts given during visit include:  The Plate Method   Meal Plan Card   Diabetes instrucitons.   Barriers to learning/adherence to lifestyle change: non3  Demonstrated degree of understanding via:  Teach Back   Monitoring/Evaluation:  Dietary intake, exercise, , and body weight in 1 month(s).

## 2018-12-07 NOTE — Progress Notes (Signed)
12/07/2018, 6:04 PM   Endocrinology follow-up note    Subjective:    Patient ID: Jaime Waller, female    DOB: April 22, 1945.  Jaime Waller is being seen in follow-up after she was seen in consultation for  management of currently uncontrolled symptomatic diabetes requested by  Pomposini, Cherly Anderson, MD.   Past Medical History:  Diagnosis Date  . Anxiety   . Asthma   . Fibromyalgia   . Hypertension   . Hypothyroidism   . Mixed hyperlipidemia   . Opioid abuse ([facility])   . Osteopenia   . Restless leg   . Type II diabetes mellitus, uncontrolled ([facility])   . Venous insufficiency   . Vitamin D deficiency      Social History   Socioeconomic History  . Marital status: Married    Spouse name: Not on file  . Number of children: Not on file  . Years of education: Not on file  . Highest education level: Not on file  Occupational History  . Not on file  Social Needs  . Financial resource strain: Not on file  . Food insecurity    Worry: Not on file    Inability: Not on file  . Transportation needs    Medical: Not on file    Non-medical: Not on file  Tobacco Use  . Smoking status: Former Smoker    Packs/day: 1.50    Quit date: 1987    Years since quitting: 33.8  . Smokeless tobacco: Never Used  Substance and Sexual Activity  . Alcohol use: Never    Frequency: Never  . Drug use: Never  . Sexual activity: Not on file  Lifestyle  . Physical activity    Days per week: Not on file    Minutes per session: Not on file  . Stress: Not on file  Relationships  . Social Herbalist on phone: Not on file    Gets together: Not on file    Attends religious service: Not on file    Active member of club or organization: Not on file    Attends meetings of clubs or organizations: Not on file    Relationship status: Not on file  Other Topics Concern  . Not on file  Social History Narrative  . Not on  file    Family History  Problem Relation Age of Onset  . Diabetes Mellitus II Mother   . Thyroid disease Mother   . Hypertension Mother   . CAD Mother   . Kidney disease Mother   . Heart failure Mother   . Cancer Mother   . Hypertension Brother   . Diabetes Mellitus II Brother     Outpatient Encounter Medications as of 12/07/2018  Medication Sig  . albuterol (VENTOLIN HFA) 108 (90 Base) MCG/ACT inhaler 4 (four) times daily as needed.  . ALPRAZolam (XANAX) 0.5 MG tablet daily.  Marland Kitchen aspirin EC 81 MG tablet Take 81 mg by mouth daily.  . benzonatate (TESSALON) 200 MG capsule daily as needed.  . dicyclomine (BENTYL) 10 MG capsule daily.  . DULoxetine (CYMBALTA) 30 MG capsule Take 30 mg by mouth daily.  . Evolocumab (  REPATHA SURECLICK) 140 MG/ML SOAJ every 14 (fourteen) days.  . famotidine (PEPCID) 20 MG tablet 2 (two) times daily.  . fluticasone (FLONASE) 50 MCG/ACT nasal spray 2 sprays daily.  . furosemide (LASIX) 40 MG tablet daily.  Marland Kitchen gabapentin (NEURONTIN) 300 MG capsule at bedtime.  Marland Kitchen glipiZIDE (GLUCOTROL XL) 2.5 MG 24 hr tablet Take 1 tablet (2.5 mg total) by mouth daily with breakfast.  . HYDROcodone-acetaminophen (NORCO/VICODIN) 5-325 MG tablet 2 (two) times daily.  . insulin degludec (TRESIBA FLEXTOUCH) 100 UNIT/ML SOPN FlexTouch Pen Inject 80 Units into the skin at bedtime.  Marland Kitchen levocetirizine (XYZAL) 5 MG tablet Take 5 mg by mouth daily as needed.  Marland Kitchen levothyroxine (SYNTHROID) 100 MCG tablet Take 1 tablet (100 mcg total) by mouth daily before breakfast.  . liraglutide (VICTOZA) 18 MG/3ML SOPN 1.8 mg daily.  . meloxicam (MOBIC) 7.5 MG tablet Take 7.5 mg by mouth daily.  . metFORMIN (GLUCOPHAGE) 1000 MG tablet Take 1,000 mg by mouth 2 (two) times daily.  . metoprolol tartrate (LOPRESSOR) 25 MG tablet Take 25 mg by mouth 2 (two) times daily with a meal.  . nitrofurantoin (MACRODANTIN) 50 MG capsule Take 50 mg by mouth daily.  Marland Kitchen nystatin cream (MYCOSTATIN) 2 (two) times daily.  .  pantoprazole (PROTONIX) 40 MG tablet Take 40 mg by mouth daily.  . phenazopyridine (PYRIDIUM) 200 MG tablet Take 200 mg by mouth as needed.  . pramipexole (MIRAPEX) 0.125 MG tablet 2 tablets at bedtime as needed.  . predniSONE (DELTASONE) 5 MG tablet Take 5 mg by mouth daily as needed.  . STOOL SOFTENER 100 MG capsule 2 capsules daily.  Marland Kitchen triamterene-hydrochlorothiazide (DYAZIDE) 37.5-25 MG capsule Take 1 capsule by mouth daily.  . Vitamin D, Ergocalciferol, (DRISDOL) 1.25 MG (50000 UT) CAPS capsule Take 50,000 Units by mouth once a week.   No facility-administered encounter medications on file as of 12/07/2018.     ALLERGIES: Allergies  Allergen Reactions  . Amoxicillin   . Dapagliflozin   . Ezetimibe   . Hydroxychloroquine   . Lisinopril Swelling  . Metronidazole   . Naproxen   . Pioglitazone   . Plaquenil  [Hydroxychloroquine Sulfate]   . Prednisone   . Statins     VACCINATION STATUS:  There is no immunization history on file for this patient.  Diabetes She presents for her follow-up diabetic visit. She has type 2 diabetes mellitus. Onset time: She was diagnosed at approximate age of 37 years.  She has a previous history of gestational diabetes. Her disease course has been improving. There are no hypoglycemic associated symptoms. Pertinent negatives for hypoglycemia include no confusion, headaches, pallor or seizures. Pertinent negatives for diabetes include no chest pain, no fatigue, no polydipsia, no polyphagia and no polyuria. There are no hypoglycemic complications. Symptoms are improving. Diabetic complications include peripheral neuropathy. Risk factors for coronary artery disease include diabetes mellitus, dyslipidemia, family history, hypertension, obesity, sedentary lifestyle, post-menopausal and tobacco exposure. Current diabetic treatment includes insulin injections and oral agent (monotherapy) (She is currently on Tresiba 80 units nightly, Victoza 1.8 mg daily,  metformin 1000 mg p.o. daily, and glimepiride 2 mg.). Her weight is fluctuating minimally. She is following a generally unhealthy diet. When asked about meal planning, she reported none. She has not had a previous visit with a dietitian. She never participates in exercise. Her breakfast blood glucose range is generally 140-180 mg/dl. Her bedtime blood glucose range is generally 180-200 mg/dl. Her overall blood glucose range is 180-200 mg/dl. An ACE inhibitor/angiotensin II  receptor blocker is contraindicated. Eye exam is current.  Thyroid Problem Presents for initial visit. Onset time: She is saying that she was diagnosed with hypothyroidism more than 20 years ago, has been on her current dose of levothyroxine 75 mcg for more than 5 years. Patient reports no cold intolerance, diarrhea, fatigue, heat intolerance or palpitations. Her past medical history is significant for diabetes, hyperlipidemia, neuropathy and obesity. Risk factors include family history of hypothyroidism.  Hyperlipidemia This is a chronic problem. The current episode started more than 1 year ago. Exacerbating diseases include diabetes, hypothyroidism and obesity. Pertinent negatives include no chest pain, myalgias or shortness of breath. Treatments tried: Reportedly she does not tolerate statins.  She is currently on Repatha 140 mg subcutaneously every 2 weeks. Risk factors for coronary artery disease include diabetes mellitus, dyslipidemia, obesity, a sedentary lifestyle, post-menopausal, hypertension and family history.     Review of Systems  Constitutional: Negative for chills, fatigue, fever and unexpected weight change.  HENT: Negative for trouble swallowing and voice change.   Eyes: Negative for visual disturbance.  Respiratory: Negative for cough, shortness of breath and wheezing.   Cardiovascular: Negative for chest pain, palpitations and leg swelling.  Gastrointestinal: Negative for diarrhea, nausea and vomiting.   Endocrine: Negative for cold intolerance, heat intolerance, polydipsia, polyphagia and polyuria.  Musculoskeletal: Negative for arthralgias and myalgias.  Skin: Negative for color change, pallor, rash and wound.  Neurological: Negative for seizures and headaches.  Psychiatric/Behavioral: Negative for confusion and suicidal ideas.    Objective:    BP 136/66   Pulse 92   Ht  (1.626 m)   Wt 219 lb (99.3 kg)   BMI 37.59 kg/m   Wt Readings from Last 3 Encounters:  12/07/18 219 lb (99.3 kg)  12/07/18 219 lb (99.3 kg)  09/22/18 215 lb (97.5 kg)     Physical Exam Constitutional:      Appearance: She is well-developed.  HENT:     Head: Normocephalic and atraumatic.  Neck:     Musculoskeletal: Normal range of motion and neck supple.     Thyroid: No thyromegaly.     Trachea: No tracheal deviation.  Cardiovascular:     Rate and Rhythm: Normal rate and regular rhythm.  Pulmonary:     Effort: Pulmonary effort is normal.  Abdominal:     Tenderness: There is no abdominal tenderness. There is no guarding.  Musculoskeletal: Normal range of motion.  Skin:    General: Skin is warm and dry.     Coloration: Skin is not pale.     Findings: No erythema or rash.  Neurological:     Mental Status: She is alert and oriented to person, place, and time.     Cranial Nerves: No cranial nerve deficit.     Coordination: Coordination normal.     Deep Tendon Reflexes: Reflexes are normal and symmetric.  Psychiatric:        Judgment: Judgment normal.    No results found for this or any previous visit (from the past 2160 hour(s)).    Assessment & Plan:   1. Uncontrolled type 2 diabetes mellitus with hyperglycemia (HCC)   - Donell Sliwinski has currently uncontrolled symptomatic type 2 DM since  73 years of age.  She returns with significant improvement in her glycemic profile, controlled fasting slightly above target postprandial blood glucose readings.  Her point-of-care A1c was 7.9%  improving from 8.4%.    - Recent labs reviewed. - I had a long discussion with  her about the progressive nature of diabetes and the pathology behind its complications. -her diabetes is complicated by obesity/sedentary life, peripheral neuropathy and she remains at a high risk for more acute and chronic complications which include CAD, CVA, CKD, retinopathy, and neuropathy. These are all discussed in detail with her.  - I have counseled her on diet management and weight loss, by adopting a carbohydrate restricted/protein rich diet.  - she  admits there is a room for improvement in her diet and drink choices. -  Suggestion is made for her to avoid simple carbohydrates  from her diet including Cakes, Sweet Desserts / Pastries, Ice Cream, Soda (diet and regular), Sweet Tea, Candies, Chips, Cookies, Sweet Pastries,  Store Bought Juices, Alcohol in Excess of  1-2 drinks a day, Artificial Sweeteners, Coffee Creamer, and "Sugar-free" Products. This will help patient to have stable blood glucose profile and potentially avoid unintended weight gain.   - I encouraged her to switch to  unprocessed or minimally processed complex starch and increased protein intake (animal or plant source), fruits, and vegetables.  - she is advised to stick to a routine mealtimes to eat 3 meals  a day and avoid unnecessary snacks ( to snack only to correct hypoglycemia).   - I have approached her with the following individualized plan to manage  her diabetes and patient agrees:   - she will continue to benefit from simplified treatment regimen. -She will be continued on her basal insulin Tresiba 80 units nightly, continue Victoza 1.8 mg subcutaneously daily, continue metformin 1000 mg p.o. twice daily after breakfast and after supper.   -I discussed initiated glipizide 2. 5 mg XL p.o. daily with breakfast.     -She is approached for strict monitoring of glucose 2 times a day-before breakfast and at bedtime.  - she is  warned not to take insulin without proper monitoring per orders.  - she is encouraged to call clinic for blood glucose levels less than 70 or above 300 mg /dl.  - Patient specific target  A1c;  LDL, HDL, Triglycerides, and  Waist Circumference were discussed in detail.  2) Blood Pressure /Hypertension: Her blood pressure is controlled to target.   she is advised to continue her current medications including Dyazide 37.5-25 mg p.o. daily with breakfast, metoprolol 25 mg p.o. twice daily, she documented adverse reactions for lisinopril    3) Lipids/Hyperlipidemia:   Review of her recent lipid panel showed un controlled  LDL at 131 .  she does not tolerate statins, advised to continue Repatha 140 mg subcutaneously every other week.    4)  Weight/Diet:   Her BMI 36.9-  clearly complicating her diabetes care.  I discussed with her the fact that loss of 5 - 10% of her  current body weight will have the most impact on her diabetes management.  CDE Consult will be initiated . Exercise, and detailed carbohydrates information provided  -  detailed on discharge instructions.  5) hypothyroidism-longstanding  -She is advised to continue levothyroxine 100 mcg p.o. daily before breakfast.   - We discussed about the correct intake of her thyroid hormone, on empty stomach at fasting, with water, separated by at least 30 minutes from breakfast and other medications,  and separated by more than 4 hours from calcium, iron, multivitamins, acid reflux medications (PPIs). -Patient is made aware of the fact that thyroid hormone replacement is needed for life, dose to be adjusted by periodic monitoring of thyroid function tests.  6) Chronic Care/Health  Maintenance:  -she  is not on ACEI/ARB and Statin medications and  is encouraged to initiate and continue to follow up with Ophthalmology, Dentist,  Podiatrist at least yearly or according to recommendations, and advised to  stay away from smoking. I have recommended  yearly flu vaccine and pneumonia vaccine at least every 5 years; moderate intensity exercise for up to 150 minutes weekly; and  sleep for at least 7 hours a day.  - she is  advised to maintain close follow up with Pomposini, Rande Bruntaniel L, MD for primary care needs, as well as her other providers for optimal and coordinated care.  - Time spent with the patient: 25 min, of which >50% was spent in reviewing her  current and  previous labs/studies, previous treatments, and medications doses and developing a plan for long-term care based on the latest recommendations for standards of care. Please refer to " Patient Self Inventory" in the Media  tab for reviewed elements of pertinent patient history.  Lanier PrudeGloria Sherfield participated in the discussions, expressed understanding, and voiced agreement with the above plans.  All questions were answered to her satisfaction. she is encouraged to contact clinic should she have any questions or concerns prior to her return visit.  Follow up plan: - Return in about 4 months (around 04/09/2019) for Follow up with Pre-visit Labs, Bring Meter and Logs- A1c in Office.  Marquis LunchGebre Florena Kozma, MD [facility] [address] [place], [place] [address] Phone: [phone]  Fax: [phone]    12/07/2018, 6:04 PM  This note was partially dictated with voice recognition software. Similar sounding words can be transcribed inadequately or may not  be corrected upon review.

## 2018-12-07 NOTE — Patient Instructions (Signed)

## 2018-12-08 ENCOUNTER — Encounter: Payer: Self-pay | Admitting: "Endocrinology

## 2018-12-08 LAB — POCT GLYCOSYLATED HEMOGLOBIN (HGB A1C): Hemoglobin A1C: 7.9 % — AB (ref 4.0–5.6)

## 2018-12-19 ENCOUNTER — Encounter: Payer: Self-pay | Admitting: Nutrition

## 2018-12-19 NOTE — Patient Instructions (Signed)
Goals Follow My Plate  Eat three balanced meals per day at times discussed. Cut out snacks Drink only water Don't eat past 7 pm. Goal FBS 130 mg or less and less than 150 mg/dl at bedtime. Get A1C to 7% Take insulin and meds as prescribed.

## 2019-01-09 ENCOUNTER — Telehealth: Payer: Self-pay | Admitting: "Endocrinology

## 2019-01-09 MED ORDER — TRESIBA FLEXTOUCH 100 UNIT/ML ~~LOC~~ SOPN: 80.0000 [IU] | PEN_INJECTOR | Freq: Every day | SUBCUTANEOUS | 2 refills | Status: DC

## 2019-01-09 NOTE — Telephone Encounter (Signed)
Melissa RN case manager would like you to call her regarding her insulin degludec (TRESIBA FLEXTOUCH) 100 UNIT/ML SOPN FlexTouch Pen. [phone]

## 2019-01-09 NOTE — Telephone Encounter (Signed)
Rx sent 

## 2019-01-14 ENCOUNTER — Other Ambulatory Visit: Payer: Self-pay | Admitting: "Endocrinology

## 2019-02-02 ENCOUNTER — Other Ambulatory Visit: Payer: Self-pay | Admitting: "Endocrinology

## 2019-03-30 ENCOUNTER — Other Ambulatory Visit: Payer: Self-pay

## 2019-03-30 ENCOUNTER — Telehealth: Payer: Self-pay | Admitting: "Endocrinology

## 2019-03-30 MED ORDER — PEN NEEDLES 31G X 5 MM MISC: 1.0000 | 1 refills | Status: DC

## 2019-03-30 NOTE — Telephone Encounter (Signed)
Pt needs pen needles for her [employer] and Victoza. [facility] [address] in [place]

## 2019-03-30 NOTE — Telephone Encounter (Signed)
Rx refill sent to [facility] in [place].

## 2019-04-04 ENCOUNTER — Other Ambulatory Visit: Payer: Self-pay | Admitting: "Endocrinology

## 2019-04-06 LAB — COMPREHENSIVE METABOLIC PANEL
ALT: 35 IU/L — ABNORMAL HIGH (ref 0–32)
AST: 46 IU/L — ABNORMAL HIGH (ref 0–40)
Albumin/Globulin Ratio: 1.8 (ref 1.2–2.2)
Albumin: 4.2 g/dL (ref 3.7–4.7)
Alkaline Phosphatase: 88 IU/L (ref 39–117)
BUN/Creatinine Ratio: 16 (ref 12–28)
BUN: 11 mg/dL (ref 8–27)
Bilirubin Total: 0.2 mg/dL (ref 0.0–1.2)
CO2: 21 mmol/L (ref 20–29)
Calcium: 9.6 mg/dL (ref 8.7–10.3)
Chloride: 100 mmol/L (ref 96–106)
Creatinine, Ser: 0.7 mg/dL (ref 0.57–1.00)
GFR calc Af Amer: 99 mL/min/{1.73_m2} (ref >59)
GFR calc non Af Amer: 86 mL/min/{1.73_m2} (ref >59)
Globulin, Total: 2.4 g/dL (ref 1.5–4.5)
Glucose: 204 mg/dL — ABNORMAL HIGH (ref 65–99)
Potassium: 4.3 mmol/L (ref 3.5–5.2)
Sodium: 137 mmol/L (ref 134–144)
Total Protein: 6.6 g/dL (ref 6.0–8.5)

## 2019-04-06 LAB — LIPID PANEL W/O CHOL/HDL RATIO
Cholesterol, Total: 167 mg/dL (ref 100–199)
HDL: 58 mg/dL (ref >39)
LDL Chol Calc (NIH): 84 mg/dL (ref 0–99)
Triglycerides: 145 mg/dL (ref 0–149)
VLDL Cholesterol Cal: 25 mg/dL (ref 5–40)

## 2019-04-06 LAB — T4, FREE: Free T4: 1.37 ng/dL (ref 0.82–1.77)

## 2019-04-06 LAB — VITAMIN D 25 HYDROXY (VIT D DEFICIENCY, FRACTURES): Vit D, 25-Hydroxy: 42.7 ng/mL (ref 30.0–100.0)

## 2019-04-06 LAB — MICROALBUMIN / CREATININE URINE RATIO
Creatinine, Urine: 125.5 mg/dL
Microalb/Creat Ratio: 106 mg/g creat — ABNORMAL HIGH (ref 0–29)
Microalbumin, Urine: 133.2 ug/mL

## 2019-04-06 LAB — SPECIMEN STATUS REPORT

## 2019-04-06 LAB — TSH: TSH: 0.8 u[IU]/mL (ref 0.450–4.500)

## 2019-04-11 ENCOUNTER — Encounter: Payer: Self-pay | Admitting: "Endocrinology

## 2019-04-11 ENCOUNTER — Encounter: Payer: Medicare Other | Attending: "Endocrinology | Admitting: Nutrition

## 2019-04-11 ENCOUNTER — Ambulatory Visit (INDEPENDENT_AMBULATORY_CARE_PROVIDER_SITE_OTHER): Payer: Medicare Other | Admitting: "Endocrinology

## 2019-04-11 ENCOUNTER — Other Ambulatory Visit: Payer: Self-pay

## 2019-04-11 ENCOUNTER — Ambulatory Visit: Payer: Medicare Other | Admitting: Nutrition

## 2019-04-11 VITALS — BP 138/82 | HR 106 | Ht 64.0 in | Wt 208.8 lb

## 2019-04-11 DIAGNOSIS — I10 Essential (primary) hypertension: Secondary | ICD-10-CM

## 2019-04-11 DIAGNOSIS — E1165 Type 2 diabetes mellitus with hyperglycemia: Secondary | ICD-10-CM | POA: Insufficient documentation

## 2019-04-11 DIAGNOSIS — E039 Hypothyroidism, unspecified: Secondary | ICD-10-CM | POA: Diagnosis not present

## 2019-04-11 DIAGNOSIS — E782 Mixed hyperlipidemia: Secondary | ICD-10-CM | POA: Insufficient documentation

## 2019-04-11 LAB — POCT GLYCOSYLATED HEMOGLOBIN (HGB A1C): Hemoglobin A1C: 10.7 % — AB (ref 4.0–5.6)

## 2019-04-11 MED ORDER — NOVOLIN 70/30 FLEXPEN RELION (70-30) 100 UNIT/ML ~~LOC~~ SUPN: 50.0000 [IU] | PEN_INJECTOR | Freq: Two times a day (BID) | SUBCUTANEOUS | 0 refills | Status: DC

## 2019-04-11 NOTE — Patient Instructions (Signed)

## 2019-04-11 NOTE — Progress Notes (Signed)
  Medical Nutrition Therapy:  Appt start time: 1530end time:  1600.   Assessment:  Primary concerns today: Diabetes Type 2, Obesity. Changes made:  Reports taking her insulin every day.  Says she's eating better A1C 10.7% Has been in out of insulin for 1 week. Couldn't afford the Tresibs. Has finally gottten in touch with the health department to get help with medications. Daughter is here with her. Cutting out sweet tea. Eating three meals per day now.  Sees DR. Nida today.  Will change to 70/.30 insulin 50 units BID for the next 2 weeks until she can get the [place] ans insurance situation resolved.  Lab Results  Component Value Date   HGBA1C 10.7 (A) 04/11/2019   Preferred Learning Style:  Auditory  Visual  Hands on  Learning Readiness:    Ready  Change in progress   MEDICATIONS:   DIETARY INTAKE:    24-hr recall:  B ( AM):Eggs and toast or oatmeal  Snk ( AM):   L ( PM): Chicken salad, water Snk ( PM):  D ( PM): Meat and vegetables, water Snk ( PM): Beverages: coffee,  Usual physical activity:   Estimated energy needs: 1200  calories 133g carbohydrates 90 g protein 33 g fat  Progress Towards Goal(s):  In progress.   Nutritional Diagnosis:  NB-1.1 Food and nutrition-related knowledge deficit As related to DIabetes Type 2.  As evidenced by A1c 7.9%.    Intervention:  Nutrition and Diabetes education provided on My Plate, CHO counting, meal planning, portion sizes, timing of meals, avoiding snacks between meals unless having a low blood sugar, target ranges for A1C and blood sugars, signs/symptoms and treatment of hyper/hypoglycemia, monitoring blood sugars, taking medications as prescribed, benefits of exercising 30 minutes per day and prevention of complications of DM.  Goals Follow My Plate Dont skip meals.  Eat three balanced meals per day at times discussed. Cut out snacks Drink only water Don't eat past 7 pm. Goal FBS 130 mg or less and less  than 150 mg/dl at bedtime. Get A1C to 7% Take insulin and meds as prescribed.  Teaching Method Utilized:  Visual Auditory Hands on  Handouts given during visit include:  The Plate Method   Meal Plan Card   Diabetes instrucitons.   Barriers to learning/adherence to lifestyle change: non3  Demonstrated degree of understanding via:  Teach Back   Monitoring/Evaluation:  Dietary intake, exercise, , and body weight in 1 month(s).

## 2019-04-11 NOTE — Progress Notes (Signed)
04/11/2019, 5:19 PM   Endocrinology follow-up note    Subjective:    Patient ID: Jaime Waller Nery, female    DOB: December 30, 1945.  Jaime Waller Pape is being seen in follow-up after she was seen in consultation for  management of currently uncontrolled symptomatic diabetes requested by  Pomposini, Rande Bruntaniel L, MD.   Past Medical History:  Diagnosis Date  . Anxiety   . Asthma   . Fibromyalgia   . Hypertension   . Hypothyroidism   . Mixed hyperlipidemia   . Opioid abuse (HCC)   . Osteopenia   . Restless leg   . Type II diabetes mellitus, uncontrolled (HCC)   . Venous insufficiency   . Vitamin D deficiency      Social History   Socioeconomic History  . Marital status: Married    Spouse name: Not on file  . Number of children: Not on file  . Years of education: Not on file  . Highest education level: Not on file  Occupational History  . Not on file  Tobacco Use  . Smoking status: Former Smoker    Packs/day: 1.50    Quit date: 1987    Years since quitting: 34.1  . Smokeless tobacco: Never Used  Substance and Sexual Activity  . Alcohol use: Never  . Drug use: Never  . Sexual activity: Not on file  Other Topics Concern  . Not on file  Social History Narrative  . Not on file   Social Determinants of Health   Financial Resource Strain:   . Difficulty of Paying Living Expenses: Not on file  Food Insecurity:   . Worried About Programme researcher, broadcasting/film/videounning Out of Food in the Last Year: Not on file  . Ran Out of Food in the Last Year: Not on file  Transportation Needs:   . Lack of Transportation (Medical): Not on file  . Lack of Transportation (Non-Medical): Not on file  Physical Activity:   . Days of Exercise per Week: Not on file  . Minutes of Exercise per Session: Not on file  Stress:   . Feeling of Stress : Not on file  Social Connections:   . Frequency of Communication with Friends and Family: Not on file  .  Frequency of Social Gatherings with Friends and Family: Not on file  . Attends Religious Services: Not on file  . Active Member of Clubs or Organizations: Not on file  . Attends BankerClub or Organization Meetings: Not on file  . Marital Status: Not on file    Family History  Problem Relation Age of Onset  . Diabetes Mellitus II Mother   . Thyroid disease Mother   . Hypertension Mother   . CAD Mother   . Kidney disease Mother   . Heart failure Mother   . Cancer Mother   . Hypertension Brother   . Diabetes Mellitus II Brother     Outpatient Encounter Medications as of 04/11/2019  Medication Sig  . albuterol (VENTOLIN HFA) 108 (90 Base) MCG/ACT inhaler 4 (four) times daily as needed.  . ALPRAZolam (XANAX) 0.5 MG tablet daily.  Marland Kitchen. aspirin EC 81 MG tablet Take 81 mg by mouth daily.  . benzonatate (  TESSALON) 200 MG capsule daily as needed.  . dicyclomine (BENTYL) 10 MG capsule daily.  . DULoxetine (CYMBALTA) 30 MG capsule Take 30 mg by mouth daily.  . Evolocumab (REPATHA SURECLICK) 409 MG/ML SOAJ every 14 (fourteen) days.  . famotidine (PEPCID) 20 MG tablet 2 (two) times daily.  . fluticasone (FLONASE) 50 MCG/ACT nasal spray 2 sprays daily.  . furosemide (LASIX) 40 MG tablet daily.  Marland Kitchen gabapentin (NEURONTIN) 300 MG capsule at bedtime.  Marland Kitchen glipiZIDE (GLUCOTROL XL) 2.5 MG 24 hr tablet Take 1 tablet by mouth once daily with breakfast  . HYDROcodone-acetaminophen (NORCO/VICODIN) 5-325 MG tablet 2 (two) times daily.  . insulin degludec (TRESIBA FLEXTOUCH) 100 UNIT/ML SOPN FlexTouch Pen Inject 0.8 mLs (80 Units total) into the skin at bedtime.  . Insulin Isophane & Regular Human (NOVOLIN 70/30 FLEXPEN RELION) (70-30) 100 UNIT/ML PEN Inject 50 Units into the skin 2 (two) times daily before a meal.  . Insulin Pen Needle (PEN NEEDLES) 31G X 5 MM MISC 1 each by Does not apply route as directed. Use as directed.  Marland Kitchen levocetirizine (XYZAL) 5 MG tablet Take 5 mg by mouth daily as needed.  . liraglutide  (VICTOZA) 18 MG/3ML SOPN 1.8 mg daily.  . meloxicam (MOBIC) 7.5 MG tablet Take 7.5 mg by mouth daily.  . metFORMIN (GLUCOPHAGE) 1000 MG tablet Take 1,000 mg by mouth 2 (two) times daily.  . metoprolol tartrate (LOPRESSOR) 25 MG tablet Take 25 mg by mouth 2 (two) times daily with a meal.  . nitrofurantoin (MACRODANTIN) 50 MG capsule Take 50 mg by mouth daily.  Marland Kitchen nystatin cream (MYCOSTATIN) 2 (two) times daily.  . pantoprazole (PROTONIX) 40 MG tablet Take 40 mg by mouth daily.  . phenazopyridine (PYRIDIUM) 200 MG tablet Take 200 mg by mouth as needed.  . pramipexole (MIRAPEX) 0.125 MG tablet 2 tablets at bedtime as needed.  . STOOL SOFTENER 100 MG capsule 2 capsules daily.  Marland Kitchen SYNTHROID 100 MCG tablet TAKE 1 TABLET BY MOUTH ONCE DAILY BEFROE  BREAKFAST  . triamterene-hydrochlorothiazide (DYAZIDE) 37.5-25 MG capsule Take 1 capsule by mouth daily.  . Vitamin D, Ergocalciferol, (DRISDOL) 1.25 MG (50000 UT) CAPS capsule Take 50,000 Units by mouth once a week.  . [DISCONTINUED] predniSONE (DELTASONE) 5 MG tablet Take 5 mg by mouth daily as needed.   No facility-administered encounter medications on file as of 04/11/2019.    ALLERGIES: Allergies  Allergen Reactions  . Amoxicillin   . Dapagliflozin   . Ezetimibe   . Hydroxychloroquine   . Lisinopril Swelling  . Metronidazole   . Naproxen   . Pioglitazone   . Plaquenil  [Hydroxychloroquine Sulfate]   . Prednisone   . Statins     VACCINATION STATUS:  There is no immunization history on file for this patient.  Diabetes She presents for her follow-up diabetic visit. She has type 2 diabetes mellitus. Onset time: She was diagnosed at approximate age of 30 years.  She has a previous history of gestational diabetes. Her disease course has been worsening. There are no hypoglycemic associated symptoms. Pertinent negatives for hypoglycemia include no confusion, headaches, pallor or seizures. Associated symptoms include polydipsia and polyuria.  Pertinent negatives for diabetes include no chest pain, no fatigue and no polyphagia. There are no hypoglycemic complications. Symptoms are worsening. Diabetic complications include peripheral neuropathy. Risk factors for coronary artery disease include diabetes mellitus, dyslipidemia, family history, hypertension, obesity, sedentary lifestyle, post-menopausal and tobacco exposure. Current diabetic treatment includes insulin injections and oral agent (monotherapy) (She recently  ran out of her [place].  She was supposed to be on Tresiba 80 units nightly,  Victoza 1.8 mg daily, metformin 1000 mg p.o. daily, and glipizide 2.5 mg.). Her weight is decreasing steadily. She is following a generally unhealthy diet. When asked about meal planning, she reported none. She has not had a previous visit with a dietitian. She never participates in exercise. Her breakfast blood glucose range is generally >200 mg/dl. Her bedtime blood glucose range is generally >200 mg/dl. Her overall blood glucose range is >200 mg/dl. An ACE inhibitor/angiotensin II receptor blocker is contraindicated. Eye exam is current.  Thyroid Problem Presents for initial visit. Onset time: She is saying that she was diagnosed with hypothyroidism more than 20 years ago, has been on her current dose of levothyroxine 75 mcg for more than 5 years. Patient reports no cold intolerance, diarrhea, fatigue, heat intolerance or palpitations. Her past medical history is significant for diabetes, hyperlipidemia, neuropathy and obesity. Risk factors include family history of hypothyroidism.  Hyperlipidemia This is a chronic problem. The current episode started more than 1 year ago. Exacerbating diseases include diabetes, hypothyroidism and obesity. Pertinent negatives include no chest pain, myalgias or shortness of breath. Treatments tried: Reportedly she does not tolerate statins.  She is currently on Repatha 140 mg subcutaneously every 2 weeks. Risk factors for  coronary artery disease include diabetes mellitus, dyslipidemia, obesity, a sedentary lifestyle, post-menopausal, hypertension and family history.     Review of Systems  Constitutional: Negative for chills, fatigue, fever and unexpected weight change.  HENT: Negative for trouble swallowing and voice change.   Eyes: Negative for visual disturbance.  Respiratory: Negative for cough, shortness of breath and wheezing.   Cardiovascular: Negative for chest pain, palpitations and leg swelling.  Gastrointestinal: Negative for diarrhea, nausea and vomiting.  Endocrine: Positive for polydipsia and polyuria. Negative for cold intolerance, heat intolerance and polyphagia.  Musculoskeletal: Negative for arthralgias and myalgias.  Skin: Negative for color change, pallor, rash and wound.  Neurological: Negative for seizures and headaches.  Psychiatric/Behavioral: Negative for confusion and suicidal ideas.    Objective:    BP 138/82   Pulse (!) 106   Ht 5\' 4"  (1.626 m)   Wt 208 lb 12.8 oz (94.7 kg)   BMI 35.84 kg/m   Wt Readings from Last 3 Encounters:  04/11/19 208 lb 12.8 oz (94.7 kg)  12/07/18 219 lb (99.3 kg)  12/07/18 219 lb (99.3 kg)     Physical Exam Constitutional:      Appearance: She is well-developed.  HENT:     Head: Normocephalic and atraumatic.  Neck:     Thyroid: No thyromegaly.     Trachea: No tracheal deviation.  Cardiovascular:     Rate and Rhythm: Normal rate and regular rhythm.  Pulmonary:     Effort: Pulmonary effort is normal.  Abdominal:     Tenderness: There is no abdominal tenderness. There is no guarding.  Musculoskeletal:        General: Normal range of motion.     Cervical back: Normal range of motion and neck supple.  Skin:    General: Skin is warm and dry.     Coloration: Skin is not pale.     Findings: No erythema or rash.  Neurological:     Mental Status: She is alert and oriented to person, place, and time.     Cranial Nerves: No cranial nerve  deficit.     Coordination: Coordination normal.     Deep Tendon  Reflexes: Reflexes are normal and symmetric.  Psychiatric:        Judgment: Judgment normal.    Recent Results (from the past 2160 hour(s))  Comprehensive metabolic panel     Status: Abnormal   Collection Time: 04/04/19  1:30 PM  Result Value Ref Range   Glucose 204 (H) 65 - 99 mg/dL   BUN 11 8 - 27 mg/dL   Creatinine, Ser 3.84 0.57 - 1.00 mg/dL   GFR calc non Af Amer 86 >59 mL/min/1.73   GFR calc Af Amer 99 >59 mL/min/1.73   BUN/Creatinine Ratio 16 12 - 28   Sodium 137 134 - 144 mmol/L   Potassium 4.3 3.5 - 5.2 mmol/L   Chloride 100 96 - 106 mmol/L   CO2 21 20 - 29 mmol/L   Calcium 9.6 8.7 - 10.3 mg/dL   Total Protein 6.6 6.0 - 8.5 g/dL   Albumin 4.2 3.7 - 4.7 g/dL   Globulin, Total 2.4 1.5 - 4.5 g/dL   Albumin/Globulin Ratio 1.8 1.2 - 2.2   Bilirubin Total 0.2 0.0 - 1.2 mg/dL   Alkaline Phosphatase 88 39 - 117 IU/L   AST 46 (H) 0 - 40 IU/L   ALT 35 (H) 0 - 32 IU/L  Lipid Panel w/o Chol/HDL Ratio     Status: None   Collection Time: 04/04/19  1:30 PM  Result Value Ref Range   Cholesterol, Total 167 100 - 199 mg/dL   Triglycerides 536 0 - 149 mg/dL   HDL 58 >46 mg/dL   VLDL Cholesterol Cal 25 5 - 40 mg/dL   LDL Chol Calc (NIH) 84 0 - 99 mg/dL  Microalbumin / creatinine urine ratio     Status: Abnormal   Collection Time: 04/04/19  1:30 PM  Result Value Ref Range   Creatinine, Urine 125.5 Not Estab. mg/dL   Microalbumin, Urine 803.2 Not Estab. ug/mL   Microalb/Creat Ratio 106 (H) 0 - 29 mg/g creat    Comment:                        Normal:                0 -  29                        Moderately increased: 30 - 300                        Severely increased:       >300   T4, free     Status: None   Collection Time: 04/04/19  1:30 PM  Result Value Ref Range   Free T4 1.37 0.82 - 1.77 ng/dL  TSH     Status: None   Collection Time: 04/04/19  1:30 PM  Result Value Ref Range   TSH 0.800 0.450 - 4.500 uIU/mL   VITAMIN D 25 Hydroxy (Vit-D Deficiency, Fractures)     Status: None   Collection Time: 04/04/19  1:30 PM  Result Value Ref Range   Vit D, 25-Hydroxy 42.7 30.0 - 100.0 ng/mL    Comment: Vitamin D deficiency has been defined by the Institute of Medicine and an Endocrine Society practice guideline as a level of serum 25-OH vitamin D less than 20 ng/mL (1,2). The Endocrine Society went on to further define vitamin D insufficiency as a level between 21 and 29 ng/mL (2). 1. IOM (Institute of Medicine).  2010. Dietary reference    intakes for calcium and D. [place]: The    [employer]. 2. Holick MF, [place], Bischoff-Ferrari HA, et al.    Evaluation, treatment, and prevention of vitamin D    deficiency: an Endocrine Society clinical practice    guideline. JCEM. 2011 Jul; 96(7):1911-30.   Specimen status report     Status: None   Collection Time: 04/04/19  1:30 PM  Result Value Ref Range   specimen status report Comment     Comment: Gloris Manchester CMP14 Default Gloris Manchester CMP14 Default A hand-written panel/profile was received from your office. In accordance with the [facility] Ambiguous Test Code Policy dated July 2003, we have completed your order by using the closest currently or formerly recognized AMA panel.  We have assigned Comprehensive Metabolic Panel (14), Test Code #322000 to this request.  If this is not the testing you wished to receive on this specimen, please contact the [facility] Client Inquiry/Technical Services Department to clarify the test order.  We appreciate your business. Ambig Abbrev LP Default Ambig Abbrev LP Default A hand-written panel/profile was received from your office. In accordance with the [facility] Ambiguous Test Code Policy dated July 2003, we have completed your order by using the closest currently or formerly recognized AMA panel.  We have assigned Lipid Panel, Test Code [phone] to this request. If this is not the testing you wished  to receive on this specimen, plea se contact the [facility] Client Inquiry/Technical Services Department to clarify the test order.  We appreciate your business.   HgB A1c     Status: Abnormal   Collection Time: 04/11/19  2:59 PM  Result Value Ref Range   Hemoglobin A1C 10.7 (A) 4.0 - 5.6 %   HbA1c POC (<> result, manual entry)     HbA1c, POC (prediabetic range)     HbA1c, POC (controlled diabetic range)        Assessment & Plan:   1. Uncontrolled type 2 diabetes mellitus with hyperglycemia (HCC)   - Kennis Oglesby has currently uncontrolled symptomatic type 2 DM since  73 years of age.  She returns with loss of control due to combination of reasons including lack of coverage for her Evaristo Bury, which she did not take for 2 weeks.  Her fasting and postprandial glycemic profile significantly above target.  Her point-of-care A1c today is 10.7% increasing from 7.9% during her last visit.    - Recent labs reviewed. - I had a long discussion with her about the progressive nature of diabetes and the pathology behind its complications. -her diabetes is complicated by obesity/sedentary life, peripheral neuropathy and she remains at a high risk for more acute and chronic complications which include CAD, CVA, CKD, retinopathy, and neuropathy. These are all discussed in detail with her.  - I have counseled her on diet management and weight loss, by adopting a carbohydrate restricted/protein rich diet.   - she  admits there is a room for improvement in her diet and drink choices. -  Suggestion is made for her to avoid simple carbohydrates  from her diet including Cakes, Sweet Desserts / Pastries, Ice Cream, Soda (diet and regular), Sweet Tea, Candies, Chips, Cookies, Sweet Pastries,  Store Bought Juices, Alcohol in Excess of  1-2 drinks a day, Artificial Sweeteners, Coffee Creamer, and "Sugar-free" Products. This will help patient to have stable blood glucose profile and potentially avoid unintended  weight gain.   - I encouraged her to switch to  unprocessed  or minimally processed complex starch and increased protein intake (animal or plant source), fruits, and vegetables.  - she is advised to stick to a routine mealtimes to eat 3 meals  a day and avoid unnecessary snacks ( to snack only to correct hypoglycemia).   - I have approached her with the following individualized plan to manage  her diabetes and patient agrees:   -Cost of medication is becoming an issue for her.  I discussed and initiated her on Novolin 70/30 50 units twice daily when prebreakfast and supper blood glucose readings are above 90 mg per DL. -She is approached for strict monitoring of glucose 2 times a day-before breakfast and at bedtime.  -She is advised to continue Victoza 1.8 mg subcutaneously daily, continue metformin 1000 mg p.o. twice daily after breakfast and after supper.   -She tolerated low-dose glipizide.  She is advised to continue glipizide 2. 5 mg XL p.o. daily with breakfast.   She is currently filling out patient assistance form with [employer].  If she is approved, she will be switched back to  [place].   - she is warned not to take insulin without proper monitoring per orders.  - she is encouraged to call clinic for blood glucose levels less than 70 or above 300 mg /dl.  - Patient specific target  A1c;  LDL, HDL, Triglycerides, and  Waist Circumference were discussed in detail.  2) Blood Pressure /Hypertension: Her blood pressure is controlled to target.   she is advised to continue her current medications including Dyazide 37.5-25 mg p.o. daily with breakfast, metoprolol 25 mg p.o. twice daily, she documented adverse reactions for lisinopril    3) Lipids/Hyperlipidemia: Her recent previsit labs show improved LDL of 84.    she does not tolerate statins, advised to continue Repatha 140 mg subcutaneously every other week.    4)  Weight/Diet:   Her BMI 36.9-  clearly complicating her diabetes  care.  She is a candidate for modest weight loss.  I discussed with her the fact that loss of 5 - 10% of her  current body weight will have the most impact on her diabetes management.  CDE Consult will be initiated . Exercise, and detailed carbohydrates information provided  -  detailed on discharge instructions.  5) hypothyroidism-longstanding  -She is advised to continue levothyroxine 100 mcg p.o. daily before breakfast.   - We discussed about the correct intake of her thyroid hormone, on empty stomach at fasting, with water, separated by at least 30 minutes from breakfast and other medications,  and separated by more than 4 hours from calcium, iron, multivitamins, acid reflux medications (PPIs). -Patient is made aware of the fact that thyroid hormone replacement is needed for life, dose to be adjusted by periodic monitoring of thyroid function tests.   6) Chronic Care/Health Maintenance:  -she  is not on ACEI/ARB and Statin medications and  is encouraged to initiate and continue to follow up with Ophthalmology, Dentist,  Podiatrist at least yearly or according to recommendations, and advised to  stay away from smoking. I have recommended yearly flu vaccine and pneumonia vaccine at least every 5 years; moderate intensity exercise for up to 150 minutes weekly; and  sleep for at least 7 hours a day.  - she is  advised to maintain close follow up with Pomposini, Rande Brunt, MD for primary care needs, as well as her other providers for optimal and coordinated care.  - Time spent on this patient care encounter:  45 min, of which > 50% was spent in  counseling and the rest reviewing her blood glucose logs , discussing her hypoglycemia and hyperglycemia episodes, reviewing her current and  previous labs / studies  ( including abstraction from other facilities) and medications  doses and developing a  long term treatment plan and documenting her care.   Please refer to Patient Instructions for Blood Glucose  Monitoring and Insulin/Medications Dosing Guide"  in media tab for additional information. Please  also refer to " Patient Self Inventory" in the Media  tab for reviewed elements of pertinent patient history.  Jaime Waller Avellino participated in the discussions, expressed understanding, and voiced agreement with the above plans.  All questions were answered to her satisfaction. she is encouraged to contact clinic should she have any questions or concerns prior to her return visit.   Follow up plan: - Return in about 2 weeks (around 04/25/2019), or office, for Follow up with Pre-visit Labs, Meter, and Logs.  Marquis LunchGebre Niralya Ohanian, MD [facility] [address] [place], [place] [address] Phone: [phone]  Fax: [phone]    04/11/2019, 5:19 PM  This note was partially dictated with voice recognition software. Similar sounding words can be transcribed inadequately or may not  be corrected upon review.

## 2019-04-13 ENCOUNTER — Encounter: Payer: Self-pay | Admitting: Nutrition

## 2019-04-13 ENCOUNTER — Ambulatory Visit: Payer: Medicare Other | Admitting: Nutrition

## 2019-04-13 ENCOUNTER — Ambulatory Visit: Payer: Medicare Other | Admitting: "Endocrinology

## 2019-04-13 NOTE — Patient Instructions (Signed)
Goals Follow My Plate Dont skip meals.  Eat three balanced meals per day at times discussed. Cut out snacks Drink only water Don't eat past 7 pm. Goal FBS 130 mg or less and less than 150 mg/dl at bedtime. Get A1C to 7% Take insulin and meds as prescribed.

## 2019-04-14 ENCOUNTER — Telehealth: Payer: Self-pay | Admitting: "Endocrinology

## 2019-04-14 NOTE — Telephone Encounter (Signed)
Called pt and discussed patient assistance documents.

## 2019-04-14 NOTE — Telephone Encounter (Signed)
Pt would like you to call her regarding her paperwork for pt assistance. [phone]

## 2019-04-18 ENCOUNTER — Telehealth: Payer: Self-pay

## 2019-04-18 NOTE — Telephone Encounter (Signed)
Tresiba\Victosa Paperwork  Page 3-4- needs corrected and resent   Victosa 1.8 somewhere needs to state it being 1 time a day per that doage. And the amount to put in for the order 3 was placed and should be 4- 120 days  Please REFAX after the correction

## 2019-04-18 NOTE — Telephone Encounter (Signed)
Documented corrections on patient assistance forms and faxed back to company.

## 2019-04-25 ENCOUNTER — Encounter: Payer: Self-pay | Admitting: "Endocrinology

## 2019-04-25 ENCOUNTER — Encounter: Payer: Medicare Other | Attending: "Endocrinology | Admitting: Nutrition

## 2019-04-25 ENCOUNTER — Encounter: Payer: Self-pay | Admitting: Nutrition

## 2019-04-25 ENCOUNTER — Other Ambulatory Visit: Payer: Self-pay

## 2019-04-25 ENCOUNTER — Ambulatory Visit (INDEPENDENT_AMBULATORY_CARE_PROVIDER_SITE_OTHER): Payer: Medicare Other | Admitting: "Endocrinology

## 2019-04-25 VITALS — BP 163/89 | HR 99 | Ht 64.0 in | Wt 215.8 lb

## 2019-04-25 VITALS — Ht 64.0 in | Wt 215.0 lb

## 2019-04-25 DIAGNOSIS — E1165 Type 2 diabetes mellitus with hyperglycemia: Secondary | ICD-10-CM

## 2019-04-25 DIAGNOSIS — E039 Hypothyroidism, unspecified: Secondary | ICD-10-CM

## 2019-04-25 DIAGNOSIS — I10 Essential (primary) hypertension: Secondary | ICD-10-CM | POA: Insufficient documentation

## 2019-04-25 DIAGNOSIS — E782 Mixed hyperlipidemia: Secondary | ICD-10-CM | POA: Diagnosis present

## 2019-04-25 MED ORDER — ACCU-CHEK FASTCLIX LANCETS MISC: 1 refills | Status: DC

## 2019-04-25 MED ORDER — GLUCOSE BLOOD VI STRP: ORAL_STRIP | 2 refills | Status: DC

## 2019-04-25 NOTE — Progress Notes (Signed)
  Medical Nutrition Therapy:  Appt start time: 1400end time: 1430  Assessment:  Primary concerns today: Diabetes Type 2, Obesity. Changes made:  Reports taking her insulin every day.  Says she's eating better A1C 10.7%She is taking the 70/30 insulin 50 units BID and tolerating well. Has difficulty getting sleep due to restless leg syndrome. Daughter is here with her. Cutting out sweet tea. Eating three meals per day now.  Sees DR. Nida today.  Working on eating more fresh fruits, vegetables and eating at better times of the day. Drinking more water. She noticed less leg cramps now with drinking water and better blood sugars. BS avg now 147 mg/dl instead of 387'F.  Lab Results  Component Value Date   HGBA1C 10.7 (A) 04/11/2019   Preferred Learning Style:  Auditory  Visual  Hands on  Learning Readiness:    Ready  Change in progress   MEDICATIONS:   DIETARY INTAKE:    24-hr recall:  B ( AM):Eggs and toast or oatmeal  Snk ( AM):   L ( PM): Chicken salad, water Snk ( PM):  D ( PM): Meat and vegetables, water Snk ( PM): Beverages: coffee,  Usual physical activity:   Estimated energy needs: 1200  calories 133g carbohydrates 90 g protein 33 g fat  Progress Towards Goal(s):  In progress.   Nutritional Diagnosis:  NB-1.1 Food and nutrition-related knowledge deficit As related to DIabetes Type 2.  As evidenced by A1c 7.9%.    Intervention:  Nutrition and Diabetes education provided on My Plate, CHO counting, meal planning, portion sizes, timing of meals, avoiding snacks between meals unless having a low blood sugar, target ranges for A1C and blood sugars, signs/symptoms and treatment of hyper/hypoglycemia, monitoring blood sugars, taking medications as prescribed, benefits of exercising 30 minutes per day and prevention of complications of DM.  Goals Keep up the great job! Keep eating meals on time Continue fresh fruits and vegetables. Keep drinking water Get  A1C to 7%. Lose 2 lbs per month.  Teaching Method Utilized:  Visual Auditory Hands on  Handouts given during visit include:  The Plate Method   Meal Plan Card   Diabetes instrucitons.   Barriers to learning/adherence to lifestyle change: non3  Demonstrated degree of understanding via:  Teach Back   Monitoring/Evaluation:  Dietary intake, exercise, , and body weight in  3 month(s).

## 2019-04-25 NOTE — Patient Instructions (Signed)
Goals Keep up the great job! Keep eating meals on time Continue fresh fruits and vegetables. Keep drinking water Get A1C to 7%. Lose 2 lbs per month.

## 2019-04-25 NOTE — Progress Notes (Signed)
04/25/2019, 9:32 PM   Endocrinology follow-up note    Subjective:    Patient ID: Jaime Waller, female    DOB: 1945/05/07.  Jaime Waller is being seen in follow-up after she was seen in consultation for  management of currently uncontrolled symptomatic diabetes requested by  Pomposini, Rande Brunt, MD.   Past Medical History:  Diagnosis Date  . Anxiety   . Asthma   . Fibromyalgia   . Hypertension   . Hypothyroidism   . Mixed hyperlipidemia   . Opioid abuse (HCC)   . Osteopenia   . Restless leg   . Type II diabetes mellitus, uncontrolled (HCC)   . Venous insufficiency   . Vitamin D deficiency      Social History   Socioeconomic History  . Marital status: Married    Spouse name: Not on file  . Number of children: Not on file  . Years of education: Not on file  . Highest education level: Not on file  Occupational History  . Not on file  Tobacco Use  . Smoking status: Former Smoker    Packs/day: 1.50    Quit date: 1987    Years since quitting: 34.2  . Smokeless tobacco: Never Used  Substance and Sexual Activity  . Alcohol use: Never  . Drug use: Never  . Sexual activity: Not on file  Other Topics Concern  . Not on file  Social History Narrative  . Not on file   Social Determinants of Health   Financial Resource Strain:   . Difficulty of Paying Living Expenses: Not on file  Food Insecurity:   . Worried About Programme researcher, broadcasting/film/video in the Last Year: Not on file  . Ran Out of Food in the Last Year: Not on file  Transportation Needs:   . Lack of Transportation (Medical): Not on file  . Lack of Transportation (Non-Medical): Not on file  Physical Activity:   . Days of Exercise per Week: Not on file  . Minutes of Exercise per Session: Not on file  Stress:   . Feeling of Stress : Not on file  Social Connections:   . Frequency of Communication with Friends and Family: Not on file  .  Frequency of Social Gatherings with Friends and Family: Not on file  . Attends Religious Services: Not on file  . Active Member of Clubs or Organizations: Not on file  . Attends Banker Meetings: Not on file  . Marital Status: Not on file    Family History  Problem Relation Age of Onset  . Diabetes Mellitus II Mother   . Thyroid disease Mother   . Hypertension Mother   . CAD Mother   . Kidney disease Mother   . Heart failure Mother   . Cancer Mother   . Hypertension Brother   . Diabetes Mellitus II Brother     Outpatient Encounter Medications as of 04/25/2019  Medication Sig  . Accu-Chek FastClix Lancets MISC Use to test  BG x 3 times daily and as eeded  . albuterol (VENTOLIN HFA) 108 (90 Base) MCG/ACT inhaler 4 (four) times daily as needed.  . ALPRAZolam (XANAX) 0.5 MG  tablet daily.  Marland Kitchen aspirin EC 81 MG tablet Take 81 mg by mouth daily.  . benzonatate (TESSALON) 200 MG capsule daily as needed.  . dicyclomine (BENTYL) 10 MG capsule daily.  . DULoxetine (CYMBALTA) 30 MG capsule Take 30 mg by mouth daily.  . Evolocumab (REPATHA SURECLICK) 140 MG/ML SOAJ every 14 (fourteen) days.  . famotidine (PEPCID) 20 MG tablet 2 (two) times daily.  . fluticasone (FLONASE) 50 MCG/ACT nasal spray 2 sprays daily.  . furosemide (LASIX) 40 MG tablet daily.  Marland Kitchen gabapentin (NEURONTIN) 300 MG capsule at bedtime.  Marland Kitchen glipiZIDE (GLUCOTROL XL) 2.5 MG 24 hr tablet Take 1 tablet by mouth once daily with breakfast  . glucose blood test strip Use as instructed  . HYDROcodone-acetaminophen (NORCO/VICODIN) 5-325 MG tablet 2 (two) times daily.  . Insulin Isophane & Regular Human (NOVOLIN 70/30 FLEXPEN RELION) (70-30) 100 UNIT/ML PEN Inject 50 Units into the skin 2 (two) times daily before a meal.  . Insulin Pen Needle (PEN NEEDLES) 31G X 5 MM MISC 1 each by Does not apply route as directed. Use as directed.  Marland Kitchen levocetirizine (XYZAL) 5 MG tablet Take 5 mg by mouth daily as needed.  . liraglutide  (VICTOZA) 18 MG/3ML SOPN 1.8 mg daily.  . meloxicam (MOBIC) 7.5 MG tablet Take 7.5 mg by mouth daily.  . metFORMIN (GLUCOPHAGE) 1000 MG tablet Take 1,000 mg by mouth 2 (two) times daily.  . metoprolol tartrate (LOPRESSOR) 25 MG tablet Take 25 mg by mouth 2 (two) times daily with a meal.  . nitrofurantoin (MACRODANTIN) 50 MG capsule Take 50 mg by mouth daily.  Marland Kitchen nystatin cream (MYCOSTATIN) 2 (two) times daily.  . pantoprazole (PROTONIX) 40 MG tablet Take 40 mg by mouth daily.  . phenazopyridine (PYRIDIUM) 200 MG tablet Take 200 mg by mouth as needed.  . pramipexole (MIRAPEX) 0.125 MG tablet 2 tablets at bedtime as needed.  . STOOL SOFTENER 100 MG capsule 2 capsules daily.  Marland Kitchen SYNTHROID 100 MCG tablet TAKE 1 TABLET BY MOUTH ONCE DAILY BEFROE  BREAKFAST  . triamterene-hydrochlorothiazide (DYAZIDE) 37.5-25 MG capsule Take 1 capsule by mouth daily.  . Vitamin D, Ergocalciferol, (DRISDOL) 1.25 MG (50000 UT) CAPS capsule Take 50,000 Units by mouth once a week.  . [DISCONTINUED] insulin degludec (TRESIBA FLEXTOUCH) 100 UNIT/ML SOPN FlexTouch Pen Inject 0.8 mLs (80 Units total) into the skin at bedtime.   No facility-administered encounter medications on file as of 04/25/2019.    ALLERGIES: Allergies  Allergen Reactions  . Amoxicillin   . Dapagliflozin   . Ezetimibe   . Hydroxychloroquine   . Lisinopril Swelling  . Metronidazole   . Naproxen   . Pioglitazone   . Plaquenil  [Hydroxychloroquine Sulfate]   . Prednisone   . Statins     VACCINATION STATUS:  There is no immunization history on file for this patient.  Diabetes She presents for her follow-up diabetic visit. She has type 2 diabetes mellitus. Onset time: She was diagnosed at approximate age of 24 years.  She has a previous history of gestational diabetes. Her disease course has been improving. There are no hypoglycemic associated symptoms. Pertinent negatives for hypoglycemia include no confusion, headaches, pallor or seizures.  Associated symptoms include polydipsia and polyuria. Pertinent negatives for diabetes include no chest pain, no fatigue and no polyphagia. There are no hypoglycemic complications. Symptoms are improving. Diabetic complications include peripheral neuropathy. Risk factors for coronary artery disease include diabetes mellitus, dyslipidemia, family history, hypertension, obesity, sedentary lifestyle, post-menopausal and tobacco exposure.  Current diabetic treatment includes insulin injections and oral agent (monotherapy) (She recently ran out of her [place].  She was supposed to be on Tresiba 80 units nightly,  Victoza 1.8 mg daily, metformin 1000 mg p.o. daily, and glipizide 2.5 mg.). Her weight is decreasing steadily. She is following a generally unhealthy diet. When asked about meal planning, she reported none. She has not had a previous visit with a dietitian. She never participates in exercise. Her breakfast blood glucose range is generally 140-180 mg/dl. Her lunch blood glucose range is generally 140-180 mg/dl. Her dinner blood glucose range is generally 140-180 mg/dl. Her bedtime blood glucose range is generally 140-180 mg/dl. Her overall blood glucose range is 140-180 mg/dl. (She is returning with significantly improved average blood glucose.  EAG of 147 for the last 7 days, 157 for 14 days, 200 for the last 30 days.) An ACE inhibitor/angiotensin II receptor blocker is contraindicated. Eye exam is current.  Thyroid Problem Presents for initial visit. Onset time: She is saying that she was diagnosed with hypothyroidism more than 20 years ago, has been on her current dose of levothyroxine 75 mcg for more than 5 years. Patient reports no cold intolerance, diarrhea, fatigue, heat intolerance or palpitations. Her past medical history is significant for diabetes, hyperlipidemia, neuropathy and obesity. Risk factors include family history of hypothyroidism.  Hyperlipidemia This is a chronic problem. The current  episode started more than 1 year ago. Exacerbating diseases include diabetes, hypothyroidism and obesity. Pertinent negatives include no chest pain, myalgias or shortness of breath. Treatments tried: Reportedly she does not tolerate statins.  She is currently on Repatha 140 mg subcutaneously every 2 weeks. Risk factors for coronary artery disease include diabetes mellitus, dyslipidemia, obesity, a sedentary lifestyle, post-menopausal, hypertension and family history.     Review of Systems  Constitutional: Negative for chills, fatigue, fever and unexpected weight change.  HENT: Negative for trouble swallowing and voice change.   Eyes: Negative for visual disturbance.  Respiratory: Negative for cough, shortness of breath and wheezing.   Cardiovascular: Negative for chest pain, palpitations and leg swelling.  Gastrointestinal: Negative for diarrhea, nausea and vomiting.  Endocrine: Positive for polydipsia and polyuria. Negative for cold intolerance, heat intolerance and polyphagia.  Musculoskeletal: Negative for arthralgias and myalgias.  Skin: Negative for color change, pallor, rash and wound.  Neurological: Negative for seizures and headaches.  Psychiatric/Behavioral: Negative for confusion and suicidal ideas.    Objective:    BP (!) 163/89   Pulse 99   Ht 5\' 4"  (1.626 m)   Wt 215 lb 12.8 oz (97.9 kg)   BMI 37.04 kg/m   Wt Readings from Last 3 Encounters:  04/25/19 215 lb (97.5 kg)  04/25/19 215 lb 12.8 oz (97.9 kg)  04/11/19 208 lb 12.8 oz (94.7 kg)     Physical Exam Constitutional:      Appearance: She is well-developed.  HENT:     Head: Normocephalic and atraumatic.  Neck:     Thyroid: No thyromegaly.     Trachea: No tracheal deviation.  Cardiovascular:     Rate and Rhythm: Normal rate and regular rhythm.  Pulmonary:     Effort: Pulmonary effort is normal.  Abdominal:     Tenderness: There is no abdominal tenderness. There is no guarding.  Musculoskeletal:         General: Normal range of motion.     Cervical back: Normal range of motion and neck supple.  Skin:    General: Skin is warm and  dry.     Coloration: Skin is not pale.     Findings: No erythema or rash.  Neurological:     Mental Status: She is alert and oriented to person, place, and time.     Cranial Nerves: No cranial nerve deficit.     Coordination: Coordination normal.     Deep Tendon Reflexes: Reflexes are normal and symmetric.  Psychiatric:        Judgment: Judgment normal.    Recent Results (from the past 2160 hour(s))  Comprehensive metabolic panel     Status: Abnormal   Collection Time: 04/04/19  1:30 PM  Result Value Ref Range   Glucose 204 (H) 65 - 99 mg/dL   BUN 11 8 - 27 mg/dL   Creatinine, Ser 0.70 0.57 - 1.00 mg/dL   GFR calc non Af Amer 86 >59 mL/min/1.73   GFR calc Af Amer 99 >59 mL/min/1.73   BUN/Creatinine Ratio 16 12 - 28   Sodium 137 134 - 144 mmol/L   Potassium 4.3 3.5 - 5.2 mmol/L   Chloride 100 96 - 106 mmol/L   CO2 21 20 - 29 mmol/L   Calcium 9.6 8.7 - 10.3 mg/dL   Total Protein 6.6 6.0 - 8.5 g/dL   Albumin 4.2 3.7 - 4.7 g/dL   Globulin, Total 2.4 1.5 - 4.5 g/dL   Albumin/Globulin Ratio 1.8 1.2 - 2.2   Bilirubin Total 0.2 0.0 - 1.2 mg/dL   Alkaline Phosphatase 88 39 - 117 IU/L   AST 46 (H) 0 - 40 IU/L   ALT 35 (H) 0 - 32 IU/L  Lipid Panel w/o Chol/HDL Ratio     Status: None   Collection Time: 04/04/19  1:30 PM  Result Value Ref Range   Cholesterol, Total 167 100 - 199 mg/dL   Triglycerides 145 0 - 149 mg/dL   HDL 58 >39 mg/dL   VLDL Cholesterol Cal 25 5 - 40 mg/dL   LDL Chol Calc (NIH) 84 0 - 99 mg/dL  Microalbumin / creatinine urine ratio     Status: Abnormal   Collection Time: 04/04/19  1:30 PM  Result Value Ref Range   Creatinine, Urine 125.5 Not Estab. mg/dL   Microalbumin, Urine 133.2 Not Estab. ug/mL   Microalb/Creat Ratio 106 (H) 0 - 29 mg/g creat    Comment:                        Normal:                0 -  29                         Moderately increased: 30 - 300                        Severely increased:       >300   T4, free     Status: None   Collection Time: 04/04/19  1:30 PM  Result Value Ref Range   Free T4 1.37 0.82 - 1.77 ng/dL  TSH     Status: None   Collection Time: 04/04/19  1:30 PM  Result Value Ref Range   TSH 0.800 0.450 - 4.500 uIU/mL  VITAMIN D 25 Hydroxy (Vit-D Deficiency, Fractures)     Status: None   Collection Time: 04/04/19  1:30 PM  Result Value Ref Range   Vit D, 25-Hydroxy 42.7 30.0 - 100.0  ng/mL    Comment: Vitamin D deficiency has been defined by the Institute of Medicine and an Endocrine Society practice guideline as a level of serum 25-OH vitamin D less than 20 ng/mL (1,2). The Endocrine Society went on to further define vitamin D insufficiency as a level between 21 and 29 ng/mL (2). 1. IOM (Institute of Medicine). 2010. Dietary reference    intakes for calcium and D. [place]: The    [employer]. 2. Holick MF, Binkley Dunnigan, Bischoff-Ferrari HA, et al.    Evaluation, treatment, and prevention of vitamin D    deficiency: an Endocrine Society clinical practice    guideline. JCEM. 2011 Jul; 96(7):1911-30.   Specimen status report     Status: None   Collection Time: 04/04/19  1:30 PM  Result Value Ref Range   specimen status report Comment     Comment: Gloris Manchester CMP14 Default Gloris Manchester CMP14 Default A hand-written panel/profile was received from your office. In accordance with the [facility] Ambiguous Test Code Policy dated July 2003, we have completed your order by using the closest currently or formerly recognized AMA panel.  We have assigned Comprehensive Metabolic Panel (14), Test Code #322000 to this request.  If this is not the testing you wished to receive on this specimen, please contact the [facility] Client Inquiry/Technical Services Department to clarify the test order.  We appreciate your business. Ambig Abbrev LP Default Ambig Abbrev LP Default A  hand-written panel/profile was received from your office. In accordance with the [facility] Ambiguous Test Code Policy dated July 2003, we have completed your order by using the closest currently or formerly recognized AMA panel.  We have assigned Lipid Panel, Test Code [phone] to this request. If this is not the testing you wished to receive on this specimen, plea se contact the [facility] Client Inquiry/Technical Services Department to clarify the test order.  We appreciate your business.   HgB A1c     Status: Abnormal   Collection Time: 04/11/19  2:59 PM  Result Value Ref Range   Hemoglobin A1C 10.7 (A) 4.0 - 5.6 %   HbA1c POC (<> result, manual entry)     HbA1c, POC (prediabetic range)     HbA1c, POC (controlled diabetic range)        Assessment & Plan:   1. Uncontrolled type 2 diabetes mellitus with hyperglycemia (HCC)   - Jaime Waller has currently uncontrolled symptomatic type 2 DM since  74 years of age.  She is returning with significantly improved average blood glucose.  EAG of 147 for the last 7 days, 157 for 14 days, 200 for the last 30 days.  Her point-of-care A1c today is 10.7% increasing from 7.9% during her last visit.    - Recent labs reviewed. - I had a long discussion with her about the progressive nature of diabetes and the pathology behind its complications. -her diabetes is complicated by obesity/sedentary life, peripheral neuropathy and she remains at a high risk for more acute and chronic complications which include CAD, CVA, CKD, retinopathy, and neuropathy. These are all discussed in detail with her.  - I have counseled her on diet management and weight loss, by adopting a carbohydrate restricted/protein rich diet.   - she  admits there is a room for improvement in her diet and drink choices. -  Suggestion is made for her to avoid simple carbohydrates  from her diet including Cakes, Sweet Desserts / Pastries, Ice Cream, Soda (diet and regular), Sweet Tea,  Candies, Chips, Cookies, Sweet Pastries,  Store Bought Juices, Alcohol in Excess of  1-2 drinks a day, Artificial Sweeteners, Coffee Creamer, and "Sugar-free" Products. This will help patient to have stable blood glucose profile and potentially avoid unintended weight gain.   - I encouraged her to switch to  unprocessed or minimally processed complex starch and increased protein intake (animal or plant source), fruits, and vegetables.  - she is advised to stick to a routine mealtimes to eat 3 meals  a day and avoid unnecessary snacks ( to snack only to correct hypoglycemia).   - I have approached her with the following individualized plan to manage  her diabetes and patient agrees:   -100 to the premixed insulin Novolin 70/30.  She is advised to continue  Novolin 70/30 50 units twice daily when prebreakfast and supper blood glucose readings are above 90 mg per DL. -She is approached for strict monitoring of glucose 2 times a day-before breakfast and at bedtime.  -She is advised to continue Victoza 1.8 mg subcutaneously daily, continue metformin 1000 mg p.o. twice daily after breakfast and after supper.   -She tolerated low-dose glipizide.  She is advised to continue glipizide 2. 5 mg XL p.o. daily with breakfast.   She is currently filling out patient assistance form with [employer].  If she is approved, she will be switched back to  [place].   - she is warned not to take insulin without proper monitoring per orders.  - she is encouraged to call clinic for blood glucose levels less than 70 or above 300 mg /dl.  - Patient specific target  A1c;  LDL, HDL, Triglycerides, and  Waist Circumference were discussed in detail.  2) Blood Pressure /Hypertension: Her blood pressure is controlled to target.   she is advised to continue her current medications including Dyazide 37.5-25 mg p.o. daily with breakfast, metoprolol 25 mg p.o. twice daily, she documented adverse reactions for lisinopril    3)  Lipids/Hyperlipidemia: Her recent previsit labs show improved LDL of 84.    she does not tolerate statins, advised to continue Repatha 140 mg subcutaneously every other week.    4)  Weight/Diet:   Her BMI 36.9-  clearly complicating her diabetes care.  She is a candidate for modest weight loss.  I discussed with her the fact that loss of 5 - 10% of her  current body weight will have the most impact on her diabetes management.  CDE Consult will be initiated . Exercise, and detailed carbohydrates information provided  -  detailed on discharge instructions.  5) hypothyroidism-longstanding  -Her previsit labs show thyroid function tests consistent with appropriate replacement.  She is advised to continue levothyroxine 100 mcg p.o. daily before breakfast.   - We discussed about the correct intake of her thyroid hormone, on empty stomach at fasting, with water, separated by at least 30 minutes from breakfast and other medications,  and separated by more than 4 hours from calcium, iron, multivitamins, acid reflux medications (PPIs). -Patient is made aware of the fact that thyroid hormone replacement is needed for life, dose to be adjusted by periodic monitoring of thyroid function tests.    6) Chronic Care/Health Maintenance:  -she  is not on ACEI/ARB and Statin medications and  is encouraged to initiate and continue to follow up with Ophthalmology, Dentist,  Podiatrist at least yearly or according to recommendations, and advised to  stay away from smoking. I have recommended yearly flu vaccine and pneumonia vaccine at  least every 5 years; moderate intensity exercise for up to 150 minutes weekly; and  sleep for at least 7 hours a day.  - she is  advised to maintain close follow up with Pomposini, Rande Brunt, MD for primary care needs, as well as her other providers for optimal and coordinated care.  - Time spent on this patient care encounter:  35 min, of which > 50% was spent in  counseling and the rest  reviewing her blood glucose logs , discussing her hypoglycemia and hyperglycemia episodes, reviewing her current and  previous labs / studies  ( including abstraction from other facilities) and medications  doses and developing a  long term treatment plan and documenting her care.   Please refer to Patient Instructions for Blood Glucose Monitoring and Insulin/Medications Dosing Guide"  in media tab for additional information. Please  also refer to " Patient Self Inventory" in the Media  tab for reviewed elements of pertinent patient history.  Jaime Waller participated in the discussions, expressed understanding, and voiced agreement with the above plans.  All questions were answered to her satisfaction. she is encouraged to contact clinic should she have any questions or concerns prior to her return visit.    Follow up plan: - Return in about 9 weeks (around 06/27/2019) for Bring Meter and Logs- A1c in Office.  Marquis Lunch, MD [facility] [address] [place], [place] [address] Phone: [phone]  Fax: [phone]    04/25/2019, 9:32 PM  This note was partially dictated with voice recognition software. Similar sounding words can be transcribed inadequately or may not  be corrected upon review.

## 2019-04-25 NOTE — Patient Instructions (Signed)

## 2019-04-26 ENCOUNTER — Other Ambulatory Visit: Payer: Self-pay

## 2019-04-26 MED ORDER — ACCU-CHEK FASTCLIX LANCETS MISC: 1 refills | Status: DC

## 2019-04-26 MED ORDER — GLUCOSE BLOOD VI STRP: ORAL_STRIP | 2 refills | Status: DC

## 2019-04-27 ENCOUNTER — Other Ambulatory Visit: Payer: Self-pay | Admitting: "Endocrinology

## 2019-04-27 NOTE — Telephone Encounter (Signed)
Patient calling for a refill on Insulin Isophane & Regular Human (NOVOLIN 70/30 FLEXPEN RELION) (70-30) 100 UNIT/ML PEN. &  Accu Chek Fast Clix Lancets

## 2019-05-01 ENCOUNTER — Encounter: Payer: Self-pay | Admitting: "Endocrinology

## 2019-05-02 ENCOUNTER — Other Ambulatory Visit: Payer: Self-pay | Admitting: "Endocrinology

## 2019-06-06 ENCOUNTER — Ambulatory Visit: Payer: Medicare Other | Admitting: "Endocrinology

## 2019-06-08 ENCOUNTER — Other Ambulatory Visit: Payer: Self-pay | Admitting: "Endocrinology

## 2019-06-14 ENCOUNTER — Other Ambulatory Visit: Payer: Self-pay

## 2019-06-14 ENCOUNTER — Encounter: Payer: Self-pay | Admitting: "Endocrinology

## 2019-06-14 ENCOUNTER — Ambulatory Visit (INDEPENDENT_AMBULATORY_CARE_PROVIDER_SITE_OTHER): Payer: Medicare Other | Admitting: "Endocrinology

## 2019-06-14 VITALS — BP 167/96 | HR 108 | Ht 64.0 in | Wt 220.6 lb

## 2019-06-14 DIAGNOSIS — E1165 Type 2 diabetes mellitus with hyperglycemia: Secondary | ICD-10-CM | POA: Diagnosis not present

## 2019-06-14 DIAGNOSIS — I10 Essential (primary) hypertension: Secondary | ICD-10-CM | POA: Diagnosis not present

## 2019-06-14 DIAGNOSIS — E039 Hypothyroidism, unspecified: Secondary | ICD-10-CM

## 2019-06-14 DIAGNOSIS — E782 Mixed hyperlipidemia: Secondary | ICD-10-CM | POA: Diagnosis not present

## 2019-06-14 MED ORDER — NOVOLIN 70/30 FLEXPEN RELION (70-30) 100 UNIT/ML ~~LOC~~ SUPN: 60.0000 [IU] | PEN_INJECTOR | Freq: Two times a day (BID) | SUBCUTANEOUS | 2 refills | Status: DC

## 2019-06-14 MED ORDER — NOVOLIN 70/30 FLEXPEN (70-30) 100 UNIT/ML ~~LOC~~ SUPN: PEN_INJECTOR | SUBCUTANEOUS | 0 refills | Status: DC

## 2019-06-14 MED ORDER — GLIPIZIDE ER 5 MG PO TB24: 5.0000 mg | ORAL_TABLET | Freq: Every day | ORAL | 3 refills | Status: DC

## 2019-06-14 NOTE — Patient Instructions (Addendum)

## 2019-06-14 NOTE — Progress Notes (Signed)
06/14/2019, 4:36 PM   Endocrinology follow-up note    Subjective:    Patient ID: Jaime Waller, female    DOB: [birth year].  Jaime Waller is being seen in follow-up after she was seen in consultation for  management of currently uncontrolled symptomatic diabetes requested by  Georgiann HahnShroff, Sharukh D, MD.  Her main complaint today is diabetic foot exam in order for her to get her diabetic shoes.  She brought her logs, detailed as below.  Past Medical History:  Diagnosis Date  . Anxiety   . Asthma   . Fibromyalgia   . Hypertension   . Hypothyroidism   . Mixed hyperlipidemia   . Opioid abuse (HCC)   . Osteopenia   . Restless leg   . Type II diabetes mellitus, uncontrolled (HCC)   . Venous insufficiency   . Vitamin D deficiency      Social History   Socioeconomic History  . Marital status: Married    Spouse name: Not on file  . Number of children: Not on file  . Years of education: Not on file  . Highest education level: Not on file  Occupational History  . Not on file  Tobacco Use  . Smoking status: Former Smoker    Packs/day: 1.50    Quit date: 1987    Years since quitting: 34.3  . Smokeless tobacco: Never Used  Substance and Sexual Activity  . Alcohol use: Never  . Drug use: Never  . Sexual activity: Not on file  Other Topics Concern  . Not on file  Social History Narrative  . Not on file   Social Determinants of Health   Financial Resource Strain:   . Difficulty of Paying Living Expenses:   Food Insecurity:   . Worried About Programme researcher, broadcasting/film/videounning Out of Food in the Last Year:   . Baristaan Out of Food in the Last Year:   Transportation Needs:   . Freight forwarderLack of Transportation (Medical):   Marland Kitchen. Lack of Transportation (Non-Medical):   Physical Activity:   . Days of Exercise per Week:   . Minutes of Exercise per Session:   Stress:   . Feeling of Stress :   Social Connections:   . Frequency of Communication  with Friends and Family:   . Frequency of Social Gatherings with Friends and Family:   . Attends Religious Services:   . Active Member of Clubs or Organizations:   . Attends BankerClub or Organization Meetings:   Marland Kitchen. Marital Status:     Family History  Problem Relation Age of Onset  . Diabetes Mellitus II Mother   . Thyroid disease Mother   . Hypertension Mother   . CAD Mother   . Kidney disease Mother   . Heart failure Mother   . Cancer Mother   . Hypertension Brother   . Diabetes Mellitus II Brother     Outpatient Encounter Medications as of 06/14/2019  Medication Sig  . Accu-Chek FastClix Lancets MISC Use as directed to test blood glucose two times daily  . albuterol (VENTOLIN HFA) 108 (90 Base) MCG/ACT inhaler 4 (four) times daily as needed.  . ALPRAZolam (XANAX) 0.5 MG tablet daily.  Marland Kitchen. aspirin  EC 81 MG tablet Take 81 mg by mouth daily.  . benzonatate (TESSALON) 200 MG capsule daily as needed.  . dicyclomine (BENTYL) 10 MG capsule daily.  . DULoxetine (CYMBALTA) 30 MG capsule Take 30 mg by mouth daily.  . famotidine (PEPCID) 20 MG tablet 2 (two) times daily.  . fluticasone (FLONASE) 50 MCG/ACT nasal spray 2 sprays daily.  . furosemide (LASIX) 40 MG tablet daily.  Marland Kitchen gabapentin (NEURONTIN) 300 MG capsule at bedtime.  Marland Kitchen glipiZIDE (GLUCOTROL XL) 5 MG 24 hr tablet Take 1 tablet (5 mg total) by mouth daily with breakfast.  . glucose blood test strip Use as instructed to check BG two times daily.  Marland Kitchen HYDROcodone-acetaminophen (NORCO/VICODIN) 5-325 MG tablet 2 (two) times daily.  . insulin isophane & regular human (NOVOLIN 70/30 FLEXPEN RELION) (70-30) 100 UNIT/ML KwikPen Inject 60 Units into the skin 2 (two) times daily before a meal.  . Insulin Pen Needle (PEN NEEDLES) 31G X 5 MM MISC 1 each by Does not apply route as directed. Use as directed.  Marland Kitchen levocetirizine (XYZAL) 5 MG tablet Take 5 mg by mouth daily as needed.  . liraglutide (VICTOZA) 18 MG/3ML SOPN 1.8 mg daily.  . meloxicam  (MOBIC) 7.5 MG tablet Take 7.5 mg by mouth daily.  . metFORMIN (GLUCOPHAGE) 1000 MG tablet Take 1,000 mg by mouth 2 (two) times daily.  . metoprolol tartrate (LOPRESSOR) 25 MG tablet Take 25 mg by mouth 2 (two) times daily with a meal.  . nitrofurantoin (MACRODANTIN) 50 MG capsule Take 50 mg by mouth daily.  Marland Kitchen nystatin cream (MYCOSTATIN) 2 (two) times daily.  . pantoprazole (PROTONIX) 40 MG tablet Take 40 mg by mouth daily.  . phenazopyridine (PYRIDIUM) 200 MG tablet Take 200 mg by mouth as needed.  . pramipexole (MIRAPEX) 0.125 MG tablet 2 tablets at bedtime as needed.  . STOOL SOFTENER 100 MG capsule 2 capsules daily.  Marland Kitchen SYNTHROID 100 MCG tablet TAKE 1 TABLET BY MOUTH ONCE DAILY BEFORE BREAKFAST  . triamterene-hydrochlorothiazide (DYAZIDE) 37.5-25 MG capsule Take 1 capsule by mouth daily.  . Vitamin D, Ergocalciferol, (DRISDOL) 1.25 MG (50000 UT) CAPS capsule Take 50,000 Units by mouth once a week.  . [DISCONTINUED] Evolocumab (REPATHA SURECLICK) 140 MG/ML SOAJ every 14 (fourteen) days.  . [DISCONTINUED] glipiZIDE (GLUCOTROL XL) 2.5 MG 24 hr tablet Take 1 tablet by mouth once daily with breakfast  . [DISCONTINUED] insulin isophane & regular human (NOVOLIN 70/30 FLEXPEN) (70-30) 100 UNIT/ML KwikPen INJECT 50 UNITS SUBCUTANEOUSLY TWICE DAILY BEFORE A MEAL   No facility-administered encounter medications on file as of 06/14/2019.    ALLERGIES: Allergies  Allergen Reactions  . Amoxicillin   . Dapagliflozin   . Ezetimibe   . Hydroxychloroquine   . Lisinopril Swelling  . Metronidazole   . Naproxen   . Pioglitazone   . Plaquenil  [Hydroxychloroquine Sulfate]   . Prednisone   . Statins     VACCINATION STATUS:  There is no immunization history on file for this patient.  Diabetes She presents for her follow-up diabetic visit. She has type 2 diabetes mellitus. Onset time: She was diagnosed at approximate age of 58 years.  She has a previous history of gestational diabetes. Her disease  course has been worsening. There are no hypoglycemic associated symptoms. Pertinent negatives for hypoglycemia include no confusion, headaches, pallor or seizures. Associated symptoms include polydipsia and polyuria. Pertinent negatives for diabetes include no chest pain, no fatigue and no polyphagia. There are no hypoglycemic complications. Symptoms are worsening. Diabetic  complications include peripheral neuropathy. Risk factors for coronary artery disease include diabetes mellitus, dyslipidemia, family history, hypertension, obesity, sedentary lifestyle, post-menopausal and tobacco exposure. Current diabetic treatment includes insulin injections and oral agent (monotherapy) (She recently ran out of her [place].  She was supposed to be on Tresiba 80 units nightly,  Victoza 1.8 mg daily, metformin 1000 mg p.o. daily, and glipizide 2.5 mg.). Her weight is increasing steadily. She is following a generally unhealthy diet. When asked about meal planning, she reported none. She has not had a previous visit with a dietitian. She never participates in exercise. Her home blood glucose trend is increasing steadily. Her breakfast blood glucose range is generally >200 mg/dl. Her overall blood glucose range is >200 mg/dl. (She presents with loss of control of glycemia.  This is mainly due to the fact she has not been monitoring and injecting insulin.  Her most recent 2 weeks review monitoring only 1 time before breakfast running between 186-300 mg per DL.  Also, there has been significant interruption in insulin procurement from her pharmacy.   ) An ACE inhibitor/angiotensin II receptor blocker is contraindicated. Eye exam is current.  Thyroid Problem Presents for initial visit. Onset time: She is saying that she was diagnosed with hypothyroidism more than 20 years ago, has been on her current dose of levothyroxine 75 mcg for more than 5 years. Patient reports no cold intolerance, diarrhea, fatigue, heat intolerance or  palpitations. Her past medical history is significant for diabetes, hyperlipidemia, neuropathy and obesity. Risk factors include family history of hypothyroidism.  Hyperlipidemia This is a chronic problem. The current episode started more than 1 year ago. Exacerbating diseases include diabetes, hypothyroidism and obesity. Pertinent negatives include no chest pain, myalgias or shortness of breath. Treatments tried: Reportedly she does not tolerate statins.  She is currently on Repatha 140 mg subcutaneously every 2 weeks. Risk factors for coronary artery disease include diabetes mellitus, dyslipidemia, obesity, a sedentary lifestyle, post-menopausal, hypertension and family history.     Review of Systems  Constitutional: Negative for chills, fatigue, fever and unexpected weight change.  HENT: Negative for trouble swallowing and voice change.   Eyes: Negative for visual disturbance.  Respiratory: Negative for cough, shortness of breath and wheezing.   Cardiovascular: Positive for leg swelling. Negative for chest pain and palpitations.  Gastrointestinal: Negative for diarrhea, nausea and vomiting.  Endocrine: Positive for polydipsia and polyuria. Negative for cold intolerance, heat intolerance and polyphagia.  Musculoskeletal: Positive for gait problem. Negative for arthralgias and myalgias.  Skin: Negative for color change, pallor, rash and wound.  Neurological: Negative for seizures and headaches.  Psychiatric/Behavioral: Negative for confusion and suicidal ideas.    Objective:    BP (!) 167/96   Pulse (!) 108   Ht 5\' 4"  (1.626 m)   Wt 220 lb 9.6 oz (100.1 kg)   BMI 37.87 kg/m   Wt Readings from Last 3 Encounters:  06/14/19 220 lb 9.6 oz (100.1 kg)  04/25/19 215 lb (97.5 kg)  04/25/19 215 lb 12.8 oz (97.9 kg)     Physical Exam Constitutional:      Appearance: She is well-developed.  HENT:     Head: Normocephalic and atraumatic.  Neck:     Thyroid: No thyromegaly.     Trachea:  No tracheal deviation.  Cardiovascular:     Rate and Rhythm: Normal rate and regular rhythm.  Pulmonary:     Effort: Pulmonary effort is normal.  Abdominal:     Tenderness: There is no  abdominal tenderness. There is no guarding.  Musculoskeletal:        General: Normal range of motion.     Cervical back: Normal range of motion and neck supple.     Right lower leg: Edema present.     Left lower leg: Edema present.     Comments: She has bilateral pitting pretibial edema, more pronounced in the right side.  Feet:     Right foot:     Skin integrity: Warmth present. No ulcer.     Toenail Condition: Right toenails are normal.     Left foot:     Skin integrity: No ulcer.     Toenail Condition: Left toenails are normal.     Comments: She wears a pair of diabetic shoes.  Her diabetic foot exam is consistent with diminished pulsation on bilateral dorsalis pedis as well as posterior tibial arteries. Skin:    General: Skin is warm and dry.     Coloration: Skin is not pale.     Findings: No erythema or rash.  Neurological:     Mental Status: She is alert and oriented to person, place, and time.     Cranial Nerves: No cranial nerve deficit.     Coordination: Coordination normal.     Deep Tendon Reflexes: Reflexes are normal and symmetric.  Psychiatric:        Judgment: Judgment normal.    Recent Results (from the past 2160 hour(s))  Comprehensive metabolic panel     Status: Abnormal   Collection Time: 04/04/19  1:30 PM  Result Value Ref Range   Glucose 204 (H) 65 - 99 mg/dL   BUN 11 8 - 27 mg/dL   Creatinine, Ser 1.61 0.57 - 1.00 mg/dL   GFR calc non Af Amer 86 >59 mL/min/1.73   GFR calc Af Amer 99 >59 mL/min/1.73   BUN/Creatinine Ratio 16 12 - 28   Sodium 137 134 - 144 mmol/L   Potassium 4.3 3.5 - 5.2 mmol/L   Chloride 100 96 - 106 mmol/L   CO2 21 20 - 29 mmol/L   Calcium 9.6 8.7 - 10.3 mg/dL   Total Protein 6.6 6.0 - 8.5 g/dL   Albumin 4.2 3.7 - 4.7 g/dL   Globulin, Total 2.4  1.5 - 4.5 g/dL   Albumin/Globulin Ratio 1.8 1.2 - 2.2   Bilirubin Total 0.2 0.0 - 1.2 mg/dL   Alkaline Phosphatase 88 39 - 117 IU/L   AST 46 (H) 0 - 40 IU/L   ALT 35 (H) 0 - 32 IU/L  Lipid Panel w/o Chol/HDL Ratio     Status: None   Collection Time: 04/04/19  1:30 PM  Result Value Ref Range   Cholesterol, Total 167 100 - 199 mg/dL   Triglycerides 096 0 - 149 mg/dL   HDL 58 >04 mg/dL   VLDL Cholesterol Cal 25 5 - 40 mg/dL   LDL Chol Calc (NIH) 84 0 - 99 mg/dL  Microalbumin / creatinine urine ratio     Status: Abnormal   Collection Time: 04/04/19  1:30 PM  Result Value Ref Range   Creatinine, Urine 125.5 Not Estab. mg/dL   Microalbumin, Urine 540.9 Not Estab. ug/mL   Microalb/Creat Ratio 106 (H) 0 - 29 mg/g creat    Comment:                        Normal:  0 -  29                        Moderately increased: 30 - 300                        Severely increased:       >300   T4, free     Status: None   Collection Time: 04/04/19  1:30 PM  Result Value Ref Range   Free T4 1.37 0.82 - 1.77 ng/dL  TSH     Status: None   Collection Time: 04/04/19  1:30 PM  Result Value Ref Range   TSH 0.800 0.450 - 4.500 uIU/mL  VITAMIN D 25 Hydroxy (Vit-D Deficiency, Fractures)     Status: None   Collection Time: 04/04/19  1:30 PM  Result Value Ref Range   Vit D, 25-Hydroxy 42.7 30.0 - 100.0 ng/mL    Comment: Vitamin D deficiency has been defined by the Institute of Medicine and an Endocrine Society practice guideline as a level of serum 25-OH vitamin D less than 20 ng/mL (1,2). The Endocrine Society went on to further define vitamin D insufficiency as a level between 21 and 29 ng/mL (2). 1. IOM (Institute of Medicine). 2010. Dietary reference    intakes for calcium and D. [place]: The    [employer]. 2. Holick MF, Binkley Mayfield, Bischoff-Ferrari HA, et al.    Evaluation, treatment, and prevention of vitamin D    deficiency: an Endocrine Society clinical practice     guideline. JCEM. 2011 Jul; 96(7):1911-30.   Specimen status report     Status: None   Collection Time: 04/04/19  1:30 PM  Result Value Ref Range   specimen status report Comment     Comment: Jaime Waller CMP14 Default Jaime Waller CMP14 Default A hand-written panel/profile was received from your office. In accordance with the [facility] Ambiguous Test Code Policy dated July 2003, we have completed your order by using the closest currently or formerly recognized AMA panel.  We have assigned Comprehensive Metabolic Panel (14), Test Code #322000 to this request.  If this is not the testing you wished to receive on this specimen, please contact the [facility] Client Inquiry/Technical Services Department to clarify the test order.  We appreciate your business. Ambig Abbrev LP Default Ambig Abbrev LP Default A hand-written panel/profile was received from your office. In accordance with the [facility] Ambiguous Test Code Policy dated July 2003, we have completed your order by using the closest currently or formerly recognized AMA panel.  We have assigned Lipid Panel, Test Code [phone] to this request. If this is not the testing you wished to receive on this specimen, plea se contact the [facility] Client Inquiry/Technical Services Department to clarify the test order.  We appreciate your business.   HgB A1c     Status: Abnormal   Collection Time: 04/11/19  2:59 PM  Result Value Ref Range   Hemoglobin A1C 10.7 (A) 4.0 - 5.6 %   HbA1c POC (<> result, manual entry)     HbA1c, POC (prediabetic range)     HbA1c, POC (controlled diabetic range)        Assessment & Plan:   1. Uncontrolled type 2 diabetes mellitus with hyperglycemia (HCC)   - Sheryll Dymek has currently uncontrolled symptomatic type 2 DM since  74 years of age.   She presents with loss of control of glycemia.  This is mainly  due to the fact she has not been monitoring and injecting insulin with supper for the last several weeks.   Her most recent 2 weeks reveal monitoring only 1 time before breakfast running between 186-300 mg per DL.  Also, there has been significant interruption in insulin procurement from her pharmacy   Her recent point-of-care A1c was 10.7%.   - Recent labs reviewed. - I had a long discussion with her about the progressive nature of diabetes and the pathology behind its complications. -her diabetes is complicated by obesity/sedentary life, peripheral neuropathy and she remains at a high risk for more acute and chronic complications which include CAD, CVA, CKD, retinopathy, and neuropathy. These are all discussed in detail with her.  - I have counseled her on diet management and weight loss, by adopting a carbohydrate restricted/protein rich diet.  - she  admits there is a room for improvement in her diet and drink choices. -  Suggestion is made for her to avoid simple carbohydrates  from her diet including Cakes, Sweet Desserts / Pastries, Ice Cream, Soda (diet and regular), Sweet Tea, Candies, Chips, Cookies, Sweet Pastries,  Store Bought Juices, Alcohol in Excess of  1-2 drinks a day, Artificial Sweeteners, Coffee Creamer, and "Sugar-free" Products. This will help patient to have stable blood glucose profile and potentially avoid unintended weight gain.  - I encouraged her to switch to  unprocessed or minimally processed complex starch and increased protein intake (animal or plant source), fruits, and vegetables.  - she is advised to stick to a routine mealtimes to eat 3 meals  a day and avoid unnecessary snacks ( to snack only to correct hypoglycemia).   - I have approached her with the following individualized plan to manage  her diabetes and patient agrees:   - she will continue to need multiple daily injections of insulin in order for her to achieve and maintain control of diabetes to target.    -I discussed and increased her Novolin 70/30 to 60  units twice daily when prebreakfast and supper  blood glucose readings are above 90 mg per DL. -She is approached for strict monitoring of glucose 2 times a day-before breakfast and at bedtime.  -She is advised to continue Victoza 1.8 mg subcutaneously daily, Metformin 1000 mg p.o. twice daily after breakfast and after supper.  She is also advised to increase her glipizide to 5 mg XL p.o. daily with breakfast.     - she is warned not to take insulin without proper monitoring per orders.  - she is encouraged to call clinic for blood glucose levels less than 70 or above 300 mg /dl.  - Patient specific target  A1c;  LDL, HDL, Triglycerides, and  Waist Circumference were discussed in detail.  2) Blood Pressure /Hypertension: Her blood pressure is not controlled to target.   she is advised to continue her current medications including Dyazide 37.5-25 mg p.o. daily with breakfast, metoprolol 25 mg p.o. twice daily, she documented adverse reactions for lisinopril    3) Lipids/Hyperlipidemia: Her recent previsit labs show improved LDL of 84.    she does not tolerate statins, advised to continue Repatha 140 mg subcutaneously every other week.    4)  Weight/Diet: Her BMI is 37.8-  clearly complicating her diabetes care.  She is a candidate for modest weight loss.  I discussed with her the fact that loss of 5 - 10% of her  current body weight will have the most impact on her diabetes management.  CDE Consult will be initiated . Exercise, and detailed carbohydrates information provided  -  detailed on discharge instructions.  5) hypothyroidism-longstanding  -Her previsit labs show thyroid function tests consistent with appropriate replacement.  She is advised to continue levothyroxine 100 mcg p.o. daily before breakfast.   - We discussed about the correct intake of her thyroid hormone, on empty stomach at fasting, with water, separated by at least 30 minutes from breakfast and other medications,  and separated by more than 4 hours from calcium, iron,  multivitamins, acid reflux medications (PPIs). -Patient is made aware of the fact that thyroid hormone replacement is needed for life, dose to be adjusted by periodic monitoring of thyroid function tests.    6) her diabetic foot exam is abnormal for peripheral neuropathy and peripheral arterial disease. -She will benefit from diabetic shoes.  Paperwork was filled out  for her.  Incidentally , she was found to have moderate bilateral  lower extremity pretibial, pedal edema.  She has normal renal function. She may need Doppler ultrasound to rule out DVT.  She is already planning to see her PMD tomorrow, advised her to call today for possible imaging of her lower extremity.  Diabetes ruled out, she will need longstanding support with compression socks.   6) Chronic Care/Health Maintenance:  -she  is not on ACEI/ARB and Statin medications and  is encouraged to initiate and continue to follow up with Ophthalmology, Dentist,  Podiatrist at least yearly or according to recommendations, and advised to  stay away from smoking. I have recommended yearly flu vaccine and pneumonia vaccine at least every 5 years; moderate intensity exercise for up to 150 minutes weekly; and  sleep for at least 7 hours a day.  - she is  advised to maintain close follow up with Arlyss Gandy, MD for primary care needs, as well as her other providers for optimal and coordinated care.  - Time spent on this patient care encounter:  35 min, of which > 50% was spent in  counseling and the rest reviewing her blood glucose logs , discussing her hypoglycemia and hyperglycemia episodes, reviewing her current and  previous labs / studies  ( including abstraction from other facilities) and medications  doses and developing a  long term treatment plan and documenting her care.   Please refer to Patient Instructions for Blood Glucose Monitoring and Insulin/Medications Dosing Guide"  in media tab for additional information. Please  also  refer to " Patient Self Inventory" in the Media  tab for reviewed elements of pertinent patient history.  [name] participated in the discussions, expressed understanding, and voiced agreement with the above plans.  All questions were answered to her satisfaction. she is encouraged to contact clinic should she have any questions or concerns prior to her return visit.   Follow up plan: - Return keep her regular appointment.  Glade Lloyd, MD [facility] [address] [place], [place] [address] Phone: [phone]  Fax: [phone]    06/14/2019, 4:36 PM  This note was partially dictated with voice recognition software. Similar sounding words can be transcribed inadequately or may not  be corrected upon review.

## 2019-06-15 ENCOUNTER — Telehealth: Payer: Self-pay | Admitting: "Endocrinology

## 2019-06-15 NOTE — Telephone Encounter (Signed)
Ok. Thanks . When I sign notes, the system sends it automatically to whoever is listed as PCP.

## 2019-06-15 NOTE — Telephone Encounter (Signed)
Dr Fransico Him, Patient asked me to remove Dr Fredia Beets because she is no longer a patient there. The notes went to him yesterday and her new PMD that I added. She no longer wants anything to go to [place]. Thanks

## 2019-06-15 NOTE — Telephone Encounter (Signed)
Per pt's request, faxed office notes to foot doctor at [phone]

## 2019-06-16 ENCOUNTER — Encounter: Payer: Self-pay | Admitting: "Endocrinology

## 2019-06-19 ENCOUNTER — Other Ambulatory Visit: Payer: Self-pay | Admitting: "Endocrinology

## 2019-06-19 ENCOUNTER — Telehealth: Payer: Self-pay | Admitting: "Endocrinology

## 2019-06-19 MED ORDER — NOVOLIN 70/30 FLEXPEN RELION (70-30) 100 UNIT/ML ~~LOC~~ SUPN: 60.0000 [IU] | PEN_INJECTOR | Freq: Two times a day (BID) | SUBCUTANEOUS | 2 refills | Status: DC

## 2019-06-19 NOTE — Telephone Encounter (Signed)
Discussed pt's insulin with her daughter.

## 2019-06-19 NOTE — Telephone Encounter (Signed)
Pt requesting a call about her insulin.

## 2019-06-27 ENCOUNTER — Encounter: Payer: Medicare Other | Attending: "Endocrinology | Admitting: Nutrition

## 2019-06-27 ENCOUNTER — Other Ambulatory Visit: Payer: Self-pay

## 2019-06-27 ENCOUNTER — Ambulatory Visit (INDEPENDENT_AMBULATORY_CARE_PROVIDER_SITE_OTHER): Payer: Medicare Other | Admitting: "Endocrinology

## 2019-06-27 ENCOUNTER — Encounter: Payer: Self-pay | Admitting: "Endocrinology

## 2019-06-27 VITALS — BP 142/83 | HR 114 | Ht 64.0 in | Wt 227.0 lb

## 2019-06-27 DIAGNOSIS — E1165 Type 2 diabetes mellitus with hyperglycemia: Secondary | ICD-10-CM | POA: Insufficient documentation

## 2019-06-27 DIAGNOSIS — E782 Mixed hyperlipidemia: Secondary | ICD-10-CM | POA: Insufficient documentation

## 2019-06-27 DIAGNOSIS — I10 Essential (primary) hypertension: Secondary | ICD-10-CM | POA: Insufficient documentation

## 2019-06-27 DIAGNOSIS — E039 Hypothyroidism, unspecified: Secondary | ICD-10-CM | POA: Diagnosis not present

## 2019-06-27 MED ORDER — NOVOLIN 70/30 FLEXPEN RELION (70-30) 100 UNIT/ML ~~LOC~~ SUPN: 70.0000 [IU] | PEN_INJECTOR | Freq: Two times a day (BID) | SUBCUTANEOUS | 2 refills | Status: DC

## 2019-06-27 NOTE — Patient Instructions (Signed)

## 2019-06-27 NOTE — Progress Notes (Signed)
Pt states she's experiencing swelling in bilateral lower legs x 2 months worsening, states swelling beginning to go up into her thighs.

## 2019-06-27 NOTE — Progress Notes (Signed)
06/27/2019, 5:05 PM   Endocrinology follow-up note    Subjective:    Patient ID: Jaime Waller, female    DOB: August 26, 1945.  Jaime Waller is being seen in follow-up after she was seen in consultation for  management of currently uncontrolled symptomatic diabetes requested by  Arlyss Gandy, MD.   Past Medical History:  Diagnosis Date  . Anxiety   . Asthma   . Fibromyalgia   . Hypertension   . Hypothyroidism   . Mixed hyperlipidemia   . Opioid abuse ([place])   . Osteopenia   . Restless leg   . Type II diabetes mellitus, uncontrolled ([place])   . Venous insufficiency   . Vitamin D deficiency      Social History   Socioeconomic History  . Marital status: Married    Spouse name: Not on file  . Number of children: Not on file  . Years of education: Not on file  . Highest education level: Not on file  Occupational History  . Not on file  Tobacco Use  . Smoking status: Former Smoker    Packs/day: 1.50    Quit date: 1987    Years since quitting: 34.3  . Smokeless tobacco: Never Used  Substance and Sexual Activity  . Alcohol use: Never  . Drug use: Never  . Sexual activity: Not on file  Other Topics Concern  . Not on file  Social History Narrative  . Not on file   Social Determinants of Health   Financial Resource Strain:   . Difficulty of Paying Living Expenses:   Food Insecurity:   . Worried About Charity fundraiser in the Last Year:   . Arboriculturist in the Last Year:   Transportation Needs:   . Film/video editor (Medical):   Marland Kitchen Lack of Transportation (Non-Medical):   Physical Activity:   . Days of Exercise per Week:   . Minutes of Exercise per Session:   Stress:   . Feeling of Stress :   Social Connections:   . Frequency of Communication with Friends and Family:   . Frequency of Social Gatherings with Friends and Family:   . Attends Religious Services:   . Active  Member of Clubs or Organizations:   . Attends Archivist Meetings:   Marland Kitchen Marital Status:     Family History  Problem Relation Age of Onset  . Diabetes Mellitus II Mother   . Thyroid disease Mother   . Hypertension Mother   . CAD Mother   . Kidney disease Mother   . Heart failure Mother   . Cancer Mother   . Hypertension Brother   . Diabetes Mellitus II Brother     Outpatient Encounter Medications as of 06/27/2019  Medication Sig  . Accu-Chek FastClix Lancets MISC Use as directed to test blood glucose two times daily  . albuterol (VENTOLIN HFA) 108 (90 Base) MCG/ACT inhaler 4 (four) times daily as needed.  . ALPRAZolam (XANAX) 0.5 MG tablet daily.  Marland Kitchen aspirin EC 81 MG tablet Take 81 mg by mouth daily.  . benzonatate (TESSALON) 200 MG capsule daily as needed.  . dicyclomine (BENTYL) 10  MG capsule daily.  . DULoxetine (CYMBALTA) 30 MG capsule Take 30 mg by mouth daily.  . famotidine (PEPCID) 20 MG tablet 2 (two) times daily.  . fluticasone (FLONASE) 50 MCG/ACT nasal spray 2 sprays daily.  . furosemide (LASIX) 40 MG tablet daily.  Marland Kitchen. gabapentin (NEURONTIN) 300 MG capsule at bedtime.  Marland Kitchen. glipiZIDE (GLUCOTROL XL) 5 MG 24 hr tablet Take 1 tablet (5 mg total) by mouth daily with breakfast.  . glucose blood test strip Use as instructed to check BG two times daily.  Marland Kitchen. HYDROcodone-acetaminophen (NORCO/VICODIN) 5-325 MG tablet 2 (two) times daily.  . insulin isophane & regular human (NOVOLIN 70/30 FLEXPEN RELION) (70-30) 100 UNIT/ML KwikPen Inject 70-80 Units into the skin 2 (two) times daily before a meal.  . Insulin Pen Needle (PEN NEEDLES) 31G X 5 MM MISC 1 each by Does not apply route as directed. Use as directed.  Marland Kitchen. levocetirizine (XYZAL) 5 MG tablet Take 5 mg by mouth daily as needed.  . liraglutide (VICTOZA) 18 MG/3ML SOPN 1.8 mg daily.  . meloxicam (MOBIC) 7.5 MG tablet Take 7.5 mg by mouth daily.  . metFORMIN (GLUCOPHAGE) 1000 MG tablet Take 1,000 mg by mouth 2 (two) times  daily.  . metoprolol tartrate (LOPRESSOR) 25 MG tablet Take 25 mg by mouth 2 (two) times daily with a meal.  . nitrofurantoin (MACRODANTIN) 50 MG capsule Take 50 mg by mouth daily.  Marland Kitchen. nystatin cream (MYCOSTATIN) 2 (two) times daily.  . pantoprazole (PROTONIX) 40 MG tablet Take 40 mg by mouth daily.  . phenazopyridine (PYRIDIUM) 200 MG tablet Take 200 mg by mouth as needed.  . pramipexole (MIRAPEX) 0.125 MG tablet 2 tablets at bedtime as needed.  . STOOL SOFTENER 100 MG capsule 2 capsules daily.  Marland Kitchen. SYNTHROID 100 MCG tablet TAKE 1 TABLET BY MOUTH ONCE DAILY BEFORE BREAKFAST  . triamterene-hydrochlorothiazide (DYAZIDE) 37.5-25 MG capsule Take 1 capsule by mouth daily.  . Vitamin D, Ergocalciferol, (DRISDOL) 1.25 MG (50000 UT) CAPS capsule Take 50,000 Units by mouth once a week.  . [DISCONTINUED] insulin isophane & regular human (NOVOLIN 70/30 FLEXPEN RELION) (70-30) 100 UNIT/ML KwikPen Inject 60 Units into the skin 2 (two) times daily before a meal.   No facility-administered encounter medications on file as of 06/27/2019.    ALLERGIES: Allergies  Allergen Reactions  . Amoxicillin   . Dapagliflozin   . Ezetimibe   . Hydroxychloroquine   . Lisinopril Swelling  . Metronidazole   . Naproxen   . Pioglitazone   . Plaquenil  [Hydroxychloroquine Sulfate]   . Prednisone   . Statins     VACCINATION STATUS:  There is no immunization history on file for this patient.  Diabetes She presents for her follow-up diabetic visit. She has type 2 diabetes mellitus. Onset time: She was diagnosed at approximate age of 74 years.  She has a previous history of gestational diabetes. Her disease course has been worsening. There are no hypoglycemic associated symptoms. Pertinent negatives for hypoglycemia include no confusion, headaches, pallor or seizures. Associated symptoms include polydipsia and polyuria. Pertinent negatives for diabetes include no chest pain, no fatigue and no polyphagia. There are no  hypoglycemic complications. Symptoms are worsening. Diabetic complications include peripheral neuropathy. Risk factors for coronary artery disease include diabetes mellitus, dyslipidemia, family history, hypertension, obesity, sedentary lifestyle, post-menopausal and tobacco exposure. Current diabetic treatment includes insulin injections and oral agent (monotherapy) (She recently ran out of her [place].  She was supposed to be on Tresiba 80 units nightly,  Victoza 1.8 mg daily, metformin 1000 mg p.o. daily, and glipizide 2.5 mg.). Her weight is increasing steadily. She is following a generally unhealthy diet. When asked about meal planning, she reported none. She has not had a previous visit with a dietitian. She never participates in exercise. Her home blood glucose trend is increasing steadily. Her breakfast blood glucose range is generally 180-200 mg/dl. Her overall blood glucose range is >200 mg/dl. (She did not monitor her blood glucose enough.  She presents with slightly better fasting glycemic profile, significantly above target postprandial blood glucose readings.   Her 7-day average is 232 of 14 readings. ) An ACE inhibitor/angiotensin II receptor blocker is contraindicated. Eye exam is current.  Thyroid Problem Presents for initial visit. Onset time: She is saying that she was diagnosed with hypothyroidism more than 20 years ago, has been on her current dose of levothyroxine 75 mcg for more than 5 years. Patient reports no cold intolerance, diarrhea, fatigue, heat intolerance or palpitations. Her past medical history is significant for diabetes, hyperlipidemia, neuropathy and obesity. Risk factors include family history of hypothyroidism.  Hyperlipidemia This is a chronic problem. The current episode started more than 1 year ago. Exacerbating diseases include diabetes, hypothyroidism and obesity. Pertinent negatives include no chest pain, myalgias or shortness of breath. Treatments tried: Reportedly  she does not tolerate statins.  She is currently on Repatha 140 mg subcutaneously every 2 weeks. Risk factors for coronary artery disease include diabetes mellitus, dyslipidemia, obesity, a sedentary lifestyle, post-menopausal, hypertension and family history.     Review of Systems  Constitutional: Negative for chills, fatigue, fever and unexpected weight change.  HENT: Negative for trouble swallowing and voice change.   Eyes: Negative for visual disturbance.  Respiratory: Negative for cough, shortness of breath and wheezing.   Cardiovascular: Positive for leg swelling. Negative for chest pain and palpitations.  Gastrointestinal: Negative for diarrhea, nausea and vomiting.  Endocrine: Positive for polydipsia and polyuria. Negative for cold intolerance, heat intolerance and polyphagia.  Musculoskeletal: Positive for gait problem. Negative for arthralgias and myalgias.  Skin: Negative for color change, pallor, rash and wound.  Neurological: Negative for seizures and headaches.  Psychiatric/Behavioral: Negative for confusion and suicidal ideas.    Objective:    BP (!) 142/83   Pulse (!) 114   Ht 5\' 4"  (1.626 m)   Wt 227 lb (103 kg)   BMI 38.96 kg/m   Wt Readings from Last 3 Encounters:  06/27/19 227 lb (103 kg)  06/14/19 220 lb 9.6 oz (100.1 kg)  04/25/19 215 lb (97.5 kg)     Physical Exam Constitutional:      Appearance: She is well-developed.  HENT:     Head: Normocephalic and atraumatic.  Neck:     Thyroid: No thyromegaly.     Trachea: No tracheal deviation.  Cardiovascular:     Rate and Rhythm: Normal rate and regular rhythm.  Pulmonary:     Effort: Pulmonary effort is normal.  Abdominal:     Tenderness: There is no abdominal tenderness. There is no guarding.  Musculoskeletal:        General: Normal range of motion.     Cervical back: Normal range of motion and neck supple.     Right lower leg: Edema present.     Left lower leg: Edema present.     Comments: She has  bilateral pitting pretibial edema, more pronounced in the right side.  Feet:     Right foot:     Skin  integrity: Warmth present. No ulcer.     Toenail Condition: Right toenails are normal.     Left foot:     Skin integrity: No ulcer.     Toenail Condition: Left toenails are normal.     Comments: She wears a pair of diabetic shoes.  Her diabetic foot exam is consistent with diminished pulsation on bilateral dorsalis pedis as well as posterior tibial arteries. Skin:    General: Skin is warm and dry.     Coloration: Skin is not pale.     Findings: No erythema or rash.  Neurological:     Mental Status: She is alert and oriented to person, place, and time.     Cranial Nerves: No cranial nerve deficit.     Coordination: Coordination normal.     Deep Tendon Reflexes: Reflexes are normal and symmetric.  Psychiatric:        Judgment: Judgment normal.    Recent Results (from the past 2160 hour(s))  Comprehensive metabolic panel     Status: Abnormal   Collection Time: 04/04/19  1:30 PM  Result Value Ref Range   Glucose 204 (H) 65 - 99 mg/dL   BUN 11 8 - 27 mg/dL   Creatinine, Ser 4.09 0.57 - 1.00 mg/dL   GFR calc non Af Amer 86 >59 mL/min/1.73   GFR calc Af Amer 99 >59 mL/min/1.73   BUN/Creatinine Ratio 16 12 - 28   Sodium 137 134 - 144 mmol/L   Potassium 4.3 3.5 - 5.2 mmol/L   Chloride 100 96 - 106 mmol/L   CO2 21 20 - 29 mmol/L   Calcium 9.6 8.7 - 10.3 mg/dL   Total Protein 6.6 6.0 - 8.5 g/dL   Albumin 4.2 3.7 - 4.7 g/dL   Globulin, Total 2.4 1.5 - 4.5 g/dL   Albumin/Globulin Ratio 1.8 1.2 - 2.2   Bilirubin Total 0.2 0.0 - 1.2 mg/dL   Alkaline Phosphatase 88 39 - 117 IU/L   AST 46 (H) 0 - 40 IU/L   ALT 35 (H) 0 - 32 IU/L  Lipid Panel w/o Chol/HDL Ratio     Status: None   Collection Time: 04/04/19  1:30 PM  Result Value Ref Range   Cholesterol, Total 167 100 - 199 mg/dL   Triglycerides 811 0 - 149 mg/dL   HDL 58 >91 mg/dL   VLDL Cholesterol Cal 25 5 - 40 mg/dL   LDL Chol  Calc (NIH) 84 0 - 99 mg/dL  Microalbumin / creatinine urine ratio     Status: Abnormal   Collection Time: 04/04/19  1:30 PM  Result Value Ref Range   Creatinine, Urine 125.5 Not Estab. mg/dL   Microalbumin, Urine 478.2 Not Estab. ug/mL   Microalb/Creat Ratio 106 (H) 0 - 29 mg/g creat    Comment:                        Normal:                0 -  29                        Moderately increased: 30 - 300                        Severely increased:       >300   T4, free     Status: None   Collection Time: 04/04/19  1:30  PM  Result Value Ref Range   Free T4 1.37 0.82 - 1.77 ng/dL  TSH     Status: None   Collection Time: 04/04/19  1:30 PM  Result Value Ref Range   TSH 0.800 0.450 - 4.500 uIU/mL  VITAMIN D 25 Hydroxy (Vit-D Deficiency, Fractures)     Status: None   Collection Time: 04/04/19  1:30 PM  Result Value Ref Range   Vit D, 25-Hydroxy 42.7 30.0 - 100.0 ng/mL    Comment: Vitamin D deficiency has been defined by the Institute of Medicine and an Endocrine Society practice guideline as a level of serum 25-OH vitamin D less than 20 ng/mL (1,2). The Endocrine Society went on to further define vitamin D insufficiency as a level between 21 and 29 ng/mL (2). 1. IOM (Institute of Medicine). 2010. Dietary reference    intakes for calcium and D. [place]: The    [employer]. 2. Holick MF, Binkley [place], Bischoff-Ferrari HA, et al.    Evaluation, treatment, and prevention of vitamin D    deficiency: an Endocrine Society clinical practice    guideline. JCEM. 2011 Jul; 96(7):1911-30.   Specimen status report     Status: None   Collection Time: 04/04/19  1:30 PM  Result Value Ref Range   specimen status report Comment     Comment: Gloris Manchester CMP14 Default Gloris Manchester CMP14 Default A hand-written panel/profile was received from your office. In accordance with the [facility] Ambiguous Test Code Policy dated July 2003, we have completed your order by using the closest  currently or formerly recognized AMA panel.  We have assigned Comprehensive Metabolic Panel (14), Test Code #[id number] to this request.  If this is not the testing you wished to receive on this specimen, please contact the [facility] Client Inquiry/Technical Services Department to clarify the test order.  We appreciate your business. Ambig Abbrev LP Default Ambig Abbrev LP Default A hand-written panel/profile was received from your office. In accordance with the [facility] Ambiguous Test Code Policy dated July 2003, we have completed your order by using the closest currently or formerly recognized AMA panel.  We have assigned Lipid Panel, Test Code [phone] to this request. If this is not the testing you wished to receive on this specimen, plea se contact the [facility] Client Inquiry/Technical Services Department to clarify the test order.  We appreciate your business.   HgB A1c     Status: Abnormal   Collection Time: 04/11/19  2:59 PM  Result Value Ref Range   Hemoglobin A1C 10.7 (A) 4.0 - 5.6 %   HbA1c POC (<> result, manual entry)     HbA1c, POC (prediabetic range)     HbA1c, POC (controlled diabetic range)        Assessment & Plan:   1. Uncontrolled type 2 diabetes mellitus with hyperglycemia (HCC)   - Jaime Waller has currently uncontrolled symptomatic type 2 DM since  74 years of age.   She did not monitor her blood glucose enough.  She presents with slightly better fasting glycemic profile, significantly above target postprandial blood glucose readings.   Her 7-day average is 232 of 14 readings.   Her recent point-of-care A1c was 10.7%.   - Recent labs reviewed. - I had a long discussion with her about the progressive nature of diabetes and the pathology behind its complications. -her diabetes is complicated by obesity/sedentary life, peripheral neuropathy and she remains at a high risk for more acute and chronic complications which include CAD,  CVA, CKD, retinopathy, and  neuropathy. These are all discussed in detail with her.  - I have counseled her on diet management and weight loss, by adopting a carbohydrate restricted/protein rich diet.  - she  admits there is a room for improvement in her diet and drink choices. -  Suggestion is made for her to avoid simple carbohydrates  from her diet including Cakes, Sweet Desserts / Pastries, Ice Cream, Soda (diet and regular), Sweet Tea, Candies, Chips, Cookies, Sweet Pastries,  Store Bought Juices, Alcohol in Excess of  1-2 drinks a day, Artificial Sweeteners, Coffee Creamer, and "Sugar-free" Products. This will help patient to have stable blood glucose profile and potentially avoid unintended weight gain.  - I encouraged her to switch to  unprocessed or minimally processed complex starch and increased protein intake (animal or plant source), fruits, and vegetables.  - she is advised to stick to a routine mealtimes to eat 3 meals  a day and avoid unnecessary snacks ( to snack only to correct hypoglycemia).   - I have approached her with the following individualized plan to manage  her diabetes and patient agrees:   - she will continue to need multiple daily injections of insulin in order for her to achieve and maintain control of diabetes to target.    -I discussed and increase her Novolin 70/30 to 80 units with breakfast and 70 units with supper for premeal blood glucose readings above 90 mg..    -She is approached for strict monitoring of glucose 4 times a day-before meals and at bedtime.    -She is advised to continue Victoza 1.8 mg subcutaneously daily, Metformin 1000 mg p.o. twice daily after breakfast and after supper.  She is also advised to increase her glipizide to 5 mg XL p.o. daily with breakfast.     - she is warned not to take insulin without proper monitoring per orders.  - she is encouraged to call clinic for blood glucose levels less than 70 or above 300 mg /dl.  - Patient specific target  A1c;   LDL, HDL, Triglycerides, were discussed in detail.  2) Blood Pressure /Hypertension: Her blood pressure is not controlled to target.   she is advised to continue her current medications including Dyazide 37.5-25 mg p.o. daily with breakfast, metoprolol 25 mg p.o. twice daily, she documented adverse reactions for lisinopril    3) Lipids/Hyperlipidemia: Her recent previsit labs show improved LDL of 84.    she does not tolerate statins, advised to continue Repatha 140 mg subcutaneously every other week.    4)  Weight/Diet: Her BMI is 37.8-  clearly complicating her diabetes care.  She is a candidate for modest weight loss.  I discussed with her the fact that loss of 5 - 10% of her  current body weight will have the most impact on her diabetes management.  CDE Consult will be initiated . Exercise, and detailed carbohydrates information provided  -  detailed on discharge instructions.  5) hypothyroidism-longstanding  -Her previsit labs show thyroid function tests consistent with appropriate replacement.  She is advised to continue levothyroxine 100 mcg p.o. daily before breakfast.    - We discussed about the correct intake of her thyroid hormone, on empty stomach at fasting, with water, separated by at least 30 minutes from breakfast and other medications,  and separated by more than 4 hours from calcium, iron, multivitamins, acid reflux medications (PPIs). -Patient is made aware of the fact that thyroid hormone replacement is needed for  life, dose to be adjusted by periodic monitoring of thyroid function tests.   6) her diabetic foot exam is abnormal for peripheral neuropathy and peripheral arterial disease. -She will benefit from diabetic shoes.  Paperwork was filled out  for her.  Incidentally , she was found to have moderate bilateral  lower extremity pretibial, pedal edema.  She has normal renal function. Since her last visit, she underwent Doppler exam of her lower extremities negative for  DVT.  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  She continues to have significant bilateral edema, she may need echocardiogram to rule out cardiac sufficiency.  She is advised on leg elevation, she will need longstanding support with compression socks.   6) Chronic Care/Health Maintenance:  -she  is not on ACEI/ARB and Statin medications and  is encouraged to initiate and continue to follow up with Ophthalmology, Dentist,  Podiatrist at least yearly or according to recommendations, and advised to  stay away from smoking. I have recommended yearly flu vaccine and pneumonia vaccine at least every 5 years; moderate intensity exercise for up to 150 minutes weekly; and  sleep for at least 7 hours a day.  - she is  advised to locate her PMD for primary care needs, as well as her other providers for optimal and coordinated care.  - Time spent on this patient care encounter:  35 min, of which > 50% was spent in  counseling and the rest reviewing her blood glucose logs , discussing her hypoglycemia and hyperglycemia episodes, reviewing her current and  previous labs / studies  ( including abstraction from other facilities) and medications  doses and developing a  long term treatment plan and documenting her care.   Please refer to Patient Instructions for Blood Glucose Monitoring and Insulin/Medications Dosing Guide"  in media tab for additional information. Please  also refer to " Patient Self Inventory" in the Media  tab for reviewed elements of pertinent patient history.  Jaime Waller participated in the discussions, expressed understanding, and voiced agreement with the above plans.  All questions were answered to her satisfaction. she is encouraged to contact clinic should she have any questions or concerns prior to her return visit.   Follow up plan: - Return in about 5 weeks (around 08/01/2019) for Bring Meter and Logs- A1c in Office.  Marquis Lunch, MD [facility]  [facility] [address] [place], [place] [address] Phone: [phone]  Fax: [phone]    06/27/2019, 5:05 PM  This note was partially dictated with voice recognition software. Similar sounding words can be transcribed inadequately or may not  be corrected upon review.

## 2019-06-28 ENCOUNTER — Other Ambulatory Visit: Payer: Self-pay

## 2019-06-28 DIAGNOSIS — E1165 Type 2 diabetes mellitus with hyperglycemia: Secondary | ICD-10-CM

## 2019-06-28 MED ORDER — NOVOLIN 70/30 FLEXPEN RELION (70-30) 100 UNIT/ML ~~LOC~~ SUPN: 70.0000 [IU] | PEN_INJECTOR | Freq: Two times a day (BID) | SUBCUTANEOUS | 2 refills | Status: DC

## 2019-07-19 NOTE — Progress Notes (Signed)
  Medical Nutrition Therapy:  Appt start time: 1400end time: 1430  Assessment:  Primary concerns today: Diabetes Type 2, Obesity. Saw Dr. Fransico Him today. Changes made:  Reports taking her insulin every day.  Says she's eating better A1C 10.7%She is taking the 70/30 insulin 50 units BID . Dr Fransico Him increased to 80 units in before breakfast and 70 units at night. Needs to test blood sugars 4 times per day now per DR. Nida. Has difficulty getting sleep due to restless leg syndrome. Daughter is here with her. Cutting out sweet tea. Eating three meals per day now.  Sees DR. Nida today.  Working on eating more fresh fruits, vegetables and eating at better times of the day. Drinking more water. She noticed less leg cramps now with drinking water and better blood sugars. BS avg still in  200's. Making slow progress. Needs to be more consistent with diet changes and avoiding snacks. No recent A1C yet.  Lab Results  Component Value Date   HGBA1C 10.7 (A) 04/11/2019    Preferred Learning Style:  Auditory  Visual  Hands on  Learning Readiness:    Ready  Change in progress   MEDICATIONS:   DIETARY INTAKE:    24-hr recall:  B ( AM):Eggs and toast or oatmeal  Snk ( AM):   L ( PM): Chicken salad, water Snk ( PM):  D ( PM): Meat and vegetables, water Snk ( PM): Beverages: coffee,  Usual physical activity:   Estimated energy needs: 1200  calories 133g carbohydrates 90 g protein 33 g fat  Progress Towards Goal(s):  In progress.   Nutritional Diagnosis:  NB-1.1 Food and nutrition-related knowledge deficit As related to DIabetes Type 2.  As evidenced by A1c 7.9%.    Intervention:  Nutrition and Diabetes education provided on My Plate, CHO counting, meal planning, portion sizes, timing of meals, avoiding snacks between meals unless having a low blood sugar, target ranges for A1C and blood sugars, signs/symptoms and treatment of hyper/hypoglycemia, monitoring blood sugars, taking  medications as prescribed, benefits of exercising 30 minutes per day and prevention of complications of DM.   Goals Test blood sugars 4 times per day;before meals and bedtime Take 80 units of70/30 before breakfast and 70 units at night. Increase water intake Increase fresh fruits and vegetables. Cut out on excessive CHO and snacks between meals. Get A1C dow to 8% or less.   Teaching Method Utilized:  Visual Auditory Hands on  Handouts given during visit include:  The Plate Method   Meal Plan Card   Diabetes instrucitons.   Barriers to learning/adherence to lifestyle change: non3  Demonstrated degree of understanding via:  Teach Back   Monitoring/Evaluation:  Dietary intake, exercise, , and body weight in  1 month(s).

## 2019-08-02 ENCOUNTER — Encounter: Payer: Self-pay | Admitting: "Endocrinology

## 2019-08-07 ENCOUNTER — Telehealth: Payer: Self-pay | Admitting: Nutrition

## 2019-08-07 ENCOUNTER — Encounter: Payer: Self-pay | Admitting: Nutrition

## 2019-08-07 NOTE — Telephone Encounter (Signed)
VM left for reminder of appointment  tomrrow with Dr. Laurena Spies and myself.

## 2019-08-07 NOTE — Patient Instructions (Addendum)
Goals Test blood sugars 4 times per day;before meals and bedtime Take 80 units of70/30 before breakfast and 70 units at night. Increase water intake Increase fresh fruits and vegetables. Cut out on excessive CHO and snacks between meals. Get A1C dow to 8% or less.

## 2019-08-08 ENCOUNTER — Encounter: Payer: Medicare Other | Attending: "Endocrinology | Admitting: Nutrition

## 2019-08-08 ENCOUNTER — Encounter: Payer: Self-pay | Admitting: Nutrition

## 2019-08-08 ENCOUNTER — Encounter: Payer: Self-pay | Admitting: "Endocrinology

## 2019-08-08 ENCOUNTER — Other Ambulatory Visit: Payer: Self-pay

## 2019-08-08 ENCOUNTER — Ambulatory Visit (INDEPENDENT_AMBULATORY_CARE_PROVIDER_SITE_OTHER): Payer: Medicare Other | Admitting: "Endocrinology

## 2019-08-08 VITALS — Ht 64.0 in | Wt 220.0 lb

## 2019-08-08 VITALS — BP 137/82 | HR 101 | Ht 64.0 in | Wt 220.6 lb

## 2019-08-08 DIAGNOSIS — E1165 Type 2 diabetes mellitus with hyperglycemia: Secondary | ICD-10-CM

## 2019-08-08 DIAGNOSIS — I10 Essential (primary) hypertension: Secondary | ICD-10-CM

## 2019-08-08 DIAGNOSIS — E039 Hypothyroidism, unspecified: Secondary | ICD-10-CM | POA: Diagnosis not present

## 2019-08-08 DIAGNOSIS — E782 Mixed hyperlipidemia: Secondary | ICD-10-CM

## 2019-08-08 LAB — POCT GLYCOSYLATED HEMOGLOBIN (HGB A1C): Hemoglobin A1C: 8.2 % — AB (ref 4.0–5.6)

## 2019-08-08 MED ORDER — NOVOLIN 70/30 FLEXPEN RELION (70-30) 100 UNIT/ML ~~LOC~~ SUPN: 70.0000 [IU] | PEN_INJECTOR | Freq: Two times a day (BID) | SUBCUTANEOUS | 2 refills | Status: DC

## 2019-08-08 NOTE — Patient Instructions (Signed)

## 2019-08-08 NOTE — Progress Notes (Signed)
  Medical Nutrition Therapy:  Appt start time: 1530 end time: 1600 Assessment:  Primary concerns today: Diabetes Type 2, Obesity. Saw Dr. Fransico Him today. 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 Therapist are going to work on using a compression machine for her legs soon.  Has a CPAP  Changes: drinking more water. Has cut down on processed and high fats foods. Eating more salads. Fae Pippin gto work on cutting down on snacks. Had a modified barrium swallow and cuasing some aspirating.   Going to see a ENT dr for her swelling.   Sweeling in feet and legs happened 3 months ago on both legs.  FBS: 99-180's   Bedtime: 220's Lose 7 lbs .A1C was 8.2%, down from > 10%. Feels better. Still snacking some on processed chips and cheetos. Working on eating better.. Is trying to walk more often.    Taking 70/30 70 units BI and Glipizide and Victoza. Metformin 1000 mg BID.   Lab Results  Component Value Date   HGBA1C 8.2 (A) 08/08/2019    Preferred Learning Style:  Auditory  Visual  Hands on  Learning Readiness:    Ready  Change in progress   MEDICATIONS:   DIETARY INTAKE:    24-hr recall:  B ( AM):Eggs and toast or oatmeal  Snk ( AM):   L ( PM): Chicken salad, water Snk ( PM):  D ( PM): Meat and vegetables, water Snk ( PM): Beverages: coffee,  Usual physical activity:   Estimated energy needs: 1200  calories 133g carbohydrates 90 g protein 33 g fat  Progress Towards Goal(s):  In progress.   Nutritional Diagnosis:  NB-1.1 Food and nutrition-related knowledge deficit As related to DIabetes Type 2.  As evidenced by A1c 7.9%.    Intervention:  Nutrition and Diabetes education provided on My Plate, CHO counting, meal planning, portion sizes, timing of meals, avoiding snacks between meals unless having a low blood sugar, target ranges for A1C and blood sugars, signs/symptoms and treatment of hyper/hypoglycemia, monitoring blood sugars, taking medications as prescribed, benefits of  exercising 30 minutes per day and prevention of complications of DM.  Goals  Cut out cheetos. Increase walking when tolerated. Cut out snacks Avoid using salt. Keep feet elevated  Don't eat after 7 pm.  Teaching Method Utilized:  Visual Auditory Hands on  Handouts given during visit include:  The Plate Method   Meal Plan Card   Diabetes instrucitons.   Barriers to learning/adherence to lifestyle change: non3  Demonstrated degree of understanding via:  Teach Back   Monitoring/Evaluation:  Dietary intake, exercise, , and body weight in  3 month(s).

## 2019-08-08 NOTE — Patient Instructions (Addendum)
Goals  Cut out cheetos. Increase walking when tolerated. Cut out snacks Avoid using salt. Keep feet elevated  Don't eat after 7 pm.

## 2019-08-08 NOTE — Progress Notes (Signed)
08/08/2019, 6:02 PM   Endocrinology follow-up note    Subjective:    Patient ID: Jaime Waller, female    DOB: [birth year].  Brystal Kildow is being seen in follow-up after she was seen in consultation for  management of currently uncontrolled symptomatic diabetes requested by  Georgiann Hahn, MD.   Past Medical History:  Diagnosis Date  . Anxiety   . Asthma   . Fibromyalgia   . Hypertension   . Hypothyroidism   . Mixed hyperlipidemia   . Opioid abuse (HCC)   . Osteopenia   . Restless leg   . Type II diabetes mellitus, uncontrolled (HCC)   . Venous insufficiency   . Vitamin D deficiency      Social History   Socioeconomic History  . Marital status: Married    Spouse name: Not on file  . Number of children: Not on file  . Years of education: Not on file  . Highest education level: Not on file  Occupational History  . Not on file  Tobacco Use  . Smoking status: Former Smoker    Packs/day: 1.50    Quit date: 1987    Years since quitting: 34.4  . Smokeless tobacco: Never Used  Vaping Use  . Vaping Use: Never used  Substance and Sexual Activity  . Alcohol use: Never  . Drug use: Never  . Sexual activity: Not on file  Other Topics Concern  . Not on file  Social History Narrative  . Not on file   Social Determinants of Health   Financial Resource Strain:   . Difficulty of Paying Living Expenses:   Food Insecurity:   . Worried About Programme researcher, broadcasting/film/video in the Last Year:   . Barista in the Last Year:   Transportation Needs:   . Freight forwarder (Medical):   Marland Kitchen Lack of Transportation (Non-Medical):   Physical Activity:   . Days of Exercise per Week:   . Minutes of Exercise per Session:   Stress:   . Feeling of Stress :   Social Connections:   . Frequency of Communication with Friends and Family:   . Frequency of Social Gatherings with Friends and Family:   .  Attends Religious Services:   . Active Member of Clubs or Organizations:   . Attends Banker Meetings:   Marland Kitchen Marital Status:     Family History  Problem Relation Age of Onset  . Diabetes Mellitus II Mother   . Thyroid disease Mother   . Hypertension Mother   . CAD Mother   . Kidney disease Mother   . Heart failure Mother   . Cancer Mother   . Hypertension Brother   . Diabetes Mellitus II Brother     Outpatient Encounter Medications as of 08/08/2019  Medication Sig  . Accu-Chek FastClix Lancets MISC Use as directed to test blood glucose two times daily  . albuterol (VENTOLIN HFA) 108 (90 Base) MCG/ACT inhaler 4 (four) times daily as needed.  . ALPRAZolam (XANAX) 0.5 MG tablet daily.  Marland Kitchen aspirin EC 81 MG tablet Take 81 mg by mouth daily.  . benzonatate (TESSALON) 200 MG  capsule daily as needed.  . dicyclomine (BENTYL) 10 MG capsule daily.  . DULoxetine (CYMBALTA) 30 MG capsule Take 30 mg by mouth daily.  . famotidine (PEPCID) 20 MG tablet 2 (two) times daily.  . fluticasone (FLONASE) 50 MCG/ACT nasal spray 2 sprays daily.  . furosemide (LASIX) 40 MG tablet daily.  Marland Kitchen. gabapentin (NEURONTIN) 300 MG capsule at bedtime.  Marland Kitchen. glipiZIDE (GLUCOTROL XL) 5 MG 24 hr tablet Take 1 tablet (5 mg total) by mouth daily with breakfast.  . glucose blood test strip Use as instructed to check BG two times daily.  Marland Kitchen. HYDROcodone-acetaminophen (NORCO/VICODIN) 5-325 MG tablet 2 (two) times daily.  . insulin isophane & regular human (NOVOLIN 70/30 FLEXPEN RELION) (70-30) 100 UNIT/ML KwikPen Inject 70 Units into the skin 2 (two) times daily before a meal.  . Insulin Pen Needle (PEN NEEDLES) 31G X 5 MM MISC 1 each by Does not apply route as directed. Use as directed.  Marland Kitchen. levocetirizine (XYZAL) 5 MG tablet Take 5 mg by mouth daily as needed.  . liraglutide (VICTOZA) 18 MG/3ML SOPN 1.8 mg daily.  . meloxicam (MOBIC) 7.5 MG tablet Take 7.5 mg by mouth daily.  . metFORMIN (GLUCOPHAGE) 1000 MG tablet  Take 1,000 mg by mouth 2 (two) times daily.  . metoprolol tartrate (LOPRESSOR) 25 MG tablet Take 25 mg by mouth 2 (two) times daily with a meal.  . nitrofurantoin (MACRODANTIN) 50 MG capsule Take 50 mg by mouth daily.  Marland Kitchen. nystatin cream (MYCOSTATIN) 2 (two) times daily.  . pantoprazole (PROTONIX) 40 MG tablet Take 40 mg by mouth daily.  . phenazopyridine (PYRIDIUM) 200 MG tablet Take 200 mg by mouth as needed.  . pramipexole (MIRAPEX) 0.125 MG tablet 2 tablets at bedtime as needed.  . STOOL SOFTENER 100 MG capsule 2 capsules daily.  Marland Kitchen. SYNTHROID 100 MCG tablet TAKE 1 TABLET BY MOUTH ONCE DAILY BEFORE BREAKFAST  . triamterene-hydrochlorothiazide (DYAZIDE) 37.5-25 MG capsule Take 1 capsule by mouth daily.  . Vitamin D, Ergocalciferol, (DRISDOL) 1.25 MG (50000 UT) CAPS capsule Take 50,000 Units by mouth once a week.  . [DISCONTINUED] insulin isophane & regular human (NOVOLIN 70/30 FLEXPEN RELION) (70-30) 100 UNIT/ML KwikPen Inject 70-80 Units into the skin 2 (two) times daily before a meal.   No facility-administered encounter medications on file as of 08/08/2019.    ALLERGIES: Allergies  Allergen Reactions  . Amoxicillin   . Dapagliflozin   . Ezetimibe   . Hydroxychloroquine   . Lisinopril Swelling  . Metronidazole   . Naproxen   . Pioglitazone   . Plaquenil  [Hydroxychloroquine Sulfate]   . Prednisone   . Statins     VACCINATION STATUS:  There is no immunization history on file for this patient.  Diabetes She presents for her follow-up diabetic visit. She has type 2 diabetes mellitus. Onset time: She was diagnosed at approximate age of 74 years.  She has a previous history of gestational diabetes. Her disease course has been improving. There are no hypoglycemic associated symptoms. Pertinent negatives for hypoglycemia include no confusion, headaches, pallor or seizures. Associated symptoms include polydipsia and polyuria. Pertinent negatives for diabetes include no chest pain, no  fatigue and no polyphagia. There are no hypoglycemic complications. Symptoms are improving. Diabetic complications include peripheral neuropathy. Risk factors for coronary artery disease include diabetes mellitus, dyslipidemia, family history, hypertension, obesity, sedentary lifestyle, post-menopausal and tobacco exposure. Current diabetic treatment includes insulin injections and oral agent (monotherapy) (She recently ran out of her [place].  She was  supposed to be on Tresiba 80 units nightly,  Victoza 1.8 mg daily, metformin 1000 mg p.o. daily, and glipizide 2.5 mg.). Her weight is increasing steadily. She is following a generally unhealthy diet. When asked about meal planning, she reported none. She has not had a previous visit with a dietitian. She never participates in exercise. Her home blood glucose trend is decreasing steadily. Her breakfast blood glucose range is generally 140-180 mg/dl. Her lunch blood glucose range is generally 180-200 mg/dl. Her dinner blood glucose range is generally 180-200 mg/dl. Her bedtime blood glucose range is generally 180-200 mg/dl. Her overall blood glucose range is 180-200 mg/dl. (She presents with improved glycemic profile despite the fact that she stays on Novolin 70/30 60 units twice daily instead of 80 units a.m. and 70 units p.m. recommended.  She does not have hypoglycemia, point-of-care A1c is 8.2% improving from 10.7%. ) An ACE inhibitor/angiotensin II receptor blocker is contraindicated. Eye exam is current.  Thyroid Problem Presents for initial visit. Onset time: She is saying that she was diagnosed with hypothyroidism more than 20 years ago, has been on her current dose of levothyroxine 75 mcg for more than 5 years. Patient reports no cold intolerance, diarrhea, fatigue, heat intolerance or palpitations. Her past medical history is significant for diabetes, hyperlipidemia, neuropathy and obesity. Risk factors include family history of hypothyroidism.   Hyperlipidemia This is a chronic problem. The current episode started more than 1 year ago. Exacerbating diseases include diabetes, hypothyroidism and obesity. Pertinent negatives include no chest pain, myalgias or shortness of breath. Treatments tried: Reportedly she does not tolerate statins.  She is currently on Repatha 140 mg subcutaneously every 2 weeks. Risk factors for coronary artery disease include diabetes mellitus, dyslipidemia, obesity, a sedentary lifestyle, post-menopausal, hypertension and family history.     Review of Systems  Constitutional: Negative for chills, fatigue, fever and unexpected weight change.  HENT: Negative for trouble swallowing and voice change.   Eyes: Negative for visual disturbance.  Respiratory: Negative for cough, shortness of breath and wheezing.   Cardiovascular: Positive for leg swelling. Negative for chest pain and palpitations.  Gastrointestinal: Negative for diarrhea, nausea and vomiting.  Endocrine: Positive for polydipsia and polyuria. Negative for cold intolerance, heat intolerance and polyphagia.  Musculoskeletal: Positive for gait problem. Negative for arthralgias and myalgias.  Skin: Negative for color change, pallor, rash and wound.  Neurological: Negative for seizures and headaches.  Psychiatric/Behavioral: Negative for confusion and suicidal ideas.    Objective:    BP 137/82   Pulse (!) 101   Ht  (1.626 m)   Wt 220 lb 9.6 oz (100.1 kg)   BMI 37.87 kg/m   Wt Readings from Last 3 Encounters:  08/08/19 220 lb 9.6 oz (100.1 kg)  08/08/19 220 lb (99.8 kg)  06/27/19 227 lb (103 kg)     Physical Exam Constitutional:      Appearance: She is well-developed.  HENT:     Head: Normocephalic and atraumatic.  Neck:     Thyroid: No thyromegaly.     Trachea: No tracheal deviation.  Cardiovascular:     Rate and Rhythm: Normal rate and regular rhythm.  Pulmonary:     Effort: Pulmonary effort is normal.  Abdominal:      Tenderness: There is no abdominal tenderness. There is no guarding.  Musculoskeletal:        General: Normal range of motion.     Cervical back: Normal range of motion and neck supple.  Right lower leg: Edema present.     Left lower leg: Edema present.     Comments: She has bilateral pitting pretibial edema, more pronounced in the right side.  Feet:     Right foot:     Skin integrity: Warmth present. No ulcer.     Toenail Condition: Right toenails are normal.     Left foot:     Skin integrity: No ulcer.     Toenail Condition: Left toenails are normal.     Comments: She wears a pair of diabetic shoes.  Her diabetic foot exam is consistent with diminished pulsation on bilateral dorsalis pedis as well as posterior tibial arteries. Skin:    General: Skin is warm and dry.     Coloration: Skin is not pale.     Findings: No erythema or rash.  Neurological:     Mental Status: She is alert and oriented to person, place, and time.     Cranial Nerves: No cranial nerve deficit.     Coordination: Coordination normal.     Deep Tendon Reflexes: Reflexes are normal and symmetric.  Psychiatric:        Judgment: Judgment normal.    Recent Results (from the past 2160 hour(s))  HgB A1c     Status: Abnormal   Collection Time: 08/08/19  3:18 PM  Result Value Ref Range   Hemoglobin A1C 8.2 (A) 4.0 - 5.6 %   HbA1c POC (<> result, manual entry)     HbA1c, POC (prediabetic range)     HbA1c, POC (controlled diabetic range)        Assessment & Plan:   1. Uncontrolled type 2 diabetes mellitus with hyperglycemia (HCC)   - Letita Prentiss has currently uncontrolled symptomatic type 2 DM since  74 years of age.   She presents with improved glycemic profile despite the fact that she stays on Novolin 70/30 60 units twice daily instead of 80 units a.m. and 70 units p.m. recommended.  She does not have hypoglycemia, point-of-care A1c is 8.2% improving from 10.7%.  - Recent labs reviewed. - I had a  long discussion with her about the progressive nature of diabetes and the pathology behind its complications. -her diabetes is complicated by obesity/sedentary life, peripheral neuropathy and she remains at a high risk for more acute and chronic complications which include CAD, CVA, CKD, retinopathy, and neuropathy. These are all discussed in detail with her.  - I have counseled her on diet management and weight loss, by adopting a carbohydrate restricted/protein rich diet.  - she  admits there is a room for improvement in her diet and drink choices. -  Suggestion is made for her to avoid simple carbohydrates  from her diet including Cakes, Sweet Desserts / Pastries, Ice Cream, Soda (diet and regular), Sweet Tea, Candies, Chips, Cookies, Sweet Pastries,  Store Bought Juices, Alcohol in Excess of  1-2 drinks a day, Artificial Sweeteners, Coffee Creamer, and "Sugar-free" Products. This will help patient to have stable blood glucose profile and potentially avoid unintended weight gain.   - I encouraged her to switch to  unprocessed or minimally processed complex starch and increased protein intake (animal or plant source), fruits, and vegetables.  - she is advised to stick to a routine mealtimes to eat 3 meals  a day and avoid unnecessary snacks ( to snack only to correct hypoglycemia).   - I have approached her with the following individualized plan to manage  her diabetes and patient agrees:   -  she will continue to need multiple daily injections of insulin in order for her to achieve and maintain control of diabetes to target.    -She is a higher dose of insulin in order for her to achieve better control of diabetes.  She is advised to increase her Novolin 70/30 to 70 units with breakfast and 70 units with supper when premeal blood glucose readings above 90 mg/dl.    -She is approached for strict monitoring of glucose 4 times a day-before meals and at bedtime.    -She is advised to continue  Victoza 1.8 mg subcutaneously daily, Metformin 1000 mg p.o. twice daily after breakfast and after supper.  She is also advised to continue glipizide 5 mg XL daily at breakfast.   - she is warned not to take insulin without proper monitoring per orders.  - she is encouraged to call clinic for blood glucose levels less than 70 or above 300 mg /dl.  - Patient specific target  A1c;  LDL, HDL, Triglycerides, were discussed in detail.  2) Blood Pressure /Hypertension: Her blood pressure is controlled to target.   she is advised to continue her current medications including Dyazide 37.5-25 mg p.o. daily with breakfast, metoprolol 25 mg p.o. twice daily, she documented adverse reactions for lisinopril    3) Lipids/Hyperlipidemia: Her recent previsit labs show improved LDL of 84.    she does not tolerate statins, advised to continue Repatha 140 mg subcutaneously every other week.    4)  Weight/Diet: Her BMI is 37.8-  clearly complicating her diabetes care.  She is a candidate for modest weight loss.  I discussed with her the fact that loss of 5 - 10% of her  current body weight will have the most impact on her diabetes management.  CDE Consult will be initiated . Exercise, and detailed carbohydrates information provided  -  detailed on discharge instructions.  5) hypothyroidism-longstanding  -Her previsit labs show thyroid function tests consistent with appropriate replacement.  She is advised to continue levothyroxine 100 mcg p.o. daily before breakfast.   - We discussed about the correct intake of her thyroid hormone, on empty stomach at fasting, with water, separated by at least 30 minutes from breakfast and other medications,  and separated by more than 4 hours from calcium, iron, multivitamins, acid reflux medications (PPIs). -Patient is made aware of the fact that thyroid hormone replacement is needed for life, dose to be adjusted by periodic monitoring of thyroid function tests.  6) her  diabetic foot exam is abnormal for peripheral neuropathy and peripheral arterial disease.  She also has bilateral lower extremity edema consistent with mild to moderate lymphedema. -She will benefit from diabetic shoes.  Paperwork was filled out  for her.  Incidentally , she was found to have moderate bilateral  lower extremity pretibial, pedal edema.  She has normal renal function. Since her last visit, she underwent Doppler exam of her lower extremities negative for DVT.  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  She is advised on leg elevation, she will need longstanding support with compression socks.   6) Chronic Care/Health Maintenance:  -she  is not on ACEI/ARB and Statin medications and  is encouraged to initiate and continue to follow up with Ophthalmology, Dentist,  Podiatrist at least yearly or according to recommendations, and advised to  stay away from smoking. I have recommended yearly flu vaccine and pneumonia vaccine at least every 5 years; moderate intensity exercise for  up to 150 minutes weekly; and  sleep for at least 7 hours a day.  - she is  advised to locate her PMD for primary care needs, as well as her other providers for optimal and coordinated care.  - Time spent on this patient care encounter:  35 min, of which > 50% was spent in  counseling and the rest reviewing her blood glucose logs , discussing her hypoglycemia and hyperglycemia episodes, reviewing her current and  previous labs / studies  ( including abstraction from other facilities) and medications  doses and developing a  long term treatment plan and documenting her care.   Please refer to Patient Instructions for Blood Glucose Monitoring and Insulin/Medications Dosing Guide"  in media tab for additional information. Please  also refer to " Patient Self Inventory" in the Media  tab for reviewed elements of pertinent patient history.  [name] participated in the discussions, expressed  understanding, and voiced agreement with the above plans.  All questions were answered to her satisfaction. she is encouraged to contact clinic should she have any questions or concerns prior to her return visit.   Follow up plan: - Return in about 4 months (around 12/08/2019) for Bring Meter and Logs- A1c in Office.  Glade Lloyd, MD [facility] [address] [place], [place] [address] Phone: [phone]  Fax: [phone]    08/08/2019, 6:02 PM  This note was partially dictated with voice recognition software. Similar sounding words can be transcribed inadequately or may not  be corrected upon review.

## 2019-08-14 ENCOUNTER — Telehealth: Payer: Self-pay | Admitting: "Endocrinology

## 2019-08-14 NOTE — Telephone Encounter (Signed)
Per Shirlee Limerick, pt needs to mail [place] the statement and give to Joy to be re submitted. Patient will mail it to [place]

## 2019-08-14 NOTE — Telephone Encounter (Signed)
Jaime Waller, This patient called and said she got a bill from [facility] that medicare would not pay for any of these labs that were ordered on 2/16 from Dr Fransico Him. They need information to why she needs these labs? [facility] told her to call us and have it re submitted.

## 2019-08-29 ENCOUNTER — Other Ambulatory Visit: Payer: Self-pay

## 2019-08-29 DIAGNOSIS — E1165 Type 2 diabetes mellitus with hyperglycemia: Secondary | ICD-10-CM

## 2019-08-29 MED ORDER — GLUCOSE BLOOD VI STRP: ORAL_STRIP | 3 refills | Status: DC

## 2019-09-01 ENCOUNTER — Other Ambulatory Visit: Payer: Self-pay | Admitting: "Endocrinology

## 2019-09-01 DIAGNOSIS — E1165 Type 2 diabetes mellitus with hyperglycemia: Secondary | ICD-10-CM

## 2019-09-04 ENCOUNTER — Other Ambulatory Visit: Payer: Self-pay

## 2019-09-04 ENCOUNTER — Telehealth: Payer: Self-pay | Admitting: "Endocrinology

## 2019-09-04 DIAGNOSIS — E1165 Type 2 diabetes mellitus with hyperglycemia: Secondary | ICD-10-CM

## 2019-09-04 MED ORDER — ACCU-CHEK FASTCLIX LANCETS MISC: 1 refills | Status: DC

## 2019-09-04 NOTE — Telephone Encounter (Signed)
Jaime Waller with pharmacy called and is needing clarification. [phone]

## 2019-09-04 NOTE — Telephone Encounter (Signed)
Call was returned

## 2019-10-02 ENCOUNTER — Telehealth: Payer: Self-pay | Admitting: "Endocrinology

## 2019-10-02 NOTE — Telephone Encounter (Signed)
Advised pt to call patient assistance program for refills, they will notify us if they need new scripts, pt verbalized understanding.

## 2019-10-02 NOTE — Telephone Encounter (Signed)
Pt states she needs her Victoza filled and she usually does pt assistance. Please advise.

## 2019-10-02 NOTE — Telephone Encounter (Signed)
I suggest she gets an exam and rx accordingly. She needs to establish a PCP or urgent care.

## 2019-10-02 NOTE — Telephone Encounter (Signed)
Pt states she also needs needles.

## 2019-10-02 NOTE — Telephone Encounter (Signed)
Patient made aware.

## 2019-10-02 NOTE — Telephone Encounter (Signed)
Pt asking if you would prescribe her Nystain cream and triamcinolone cream 0.1% that she uses for red areas in groin and abd. Folds as she does not have her primary care provider at this time. She is requesting 80gm tubes.

## 2019-10-05 ENCOUNTER — Other Ambulatory Visit: Payer: Self-pay

## 2019-10-05 MED ORDER — METFORMIN HCL 1000 MG PO TABS: 1000.0000 mg | ORAL_TABLET | Freq: Two times a day (BID) | ORAL | 1 refills | Status: DC

## 2019-10-09 ENCOUNTER — Telehealth: Payer: Self-pay | Admitting: "Endocrinology

## 2019-10-09 NOTE — Telephone Encounter (Signed)
Received pt's forms for pt assistance, information needed filled out and faxed back to [employer].

## 2019-10-09 NOTE — Telephone Encounter (Signed)
Pt is calling about the patient assistance form that was received on the fax machine today. States she is needing victoza and needles. Just fyi.

## 2019-10-17 ENCOUNTER — Other Ambulatory Visit: Payer: Self-pay | Admitting: "Endocrinology

## 2019-10-17 DIAGNOSIS — E1165 Type 2 diabetes mellitus with hyperglycemia: Secondary | ICD-10-CM

## 2019-10-18 ENCOUNTER — Other Ambulatory Visit: Payer: Self-pay | Admitting: "Endocrinology

## 2019-10-30 ENCOUNTER — Telehealth: Payer: Self-pay | Admitting: "Endocrinology

## 2019-10-30 ENCOUNTER — Other Ambulatory Visit: Payer: Self-pay

## 2019-10-30 ENCOUNTER — Other Ambulatory Visit: Payer: Self-pay | Admitting: "Endocrinology

## 2019-10-30 MED ORDER — VICTOZA 18 MG/3ML ~~LOC~~ SOPN: 1.8000 mg | PEN_INJECTOR | Freq: Every day | SUBCUTANEOUS | 0 refills | Status: DC

## 2019-10-30 NOTE — Telephone Encounter (Signed)
Patient is part of the pt assistant program and her liraglutide (VICTOZA) 18 MG/3ML SOPN  will not be in until the end of this week per patient. She states they will give her a one month voucher if the RX is sent to local pharmacy.  Patient is needing liraglutide (VICTOZA) 18 MG/3ML SOPN  as well as the needles sent to pharmacy below.  [facility] [address], [place] - [phone] Phone:  [phone]  Fax:  [phone]

## 2019-10-30 NOTE — Telephone Encounter (Signed)
Sent in

## 2019-11-25 ENCOUNTER — Other Ambulatory Visit: Payer: Self-pay | Admitting: "Endocrinology

## 2019-11-25 DIAGNOSIS — E1165 Type 2 diabetes mellitus with hyperglycemia: Secondary | ICD-10-CM

## 2019-11-27 ENCOUNTER — Other Ambulatory Visit: Payer: Self-pay | Admitting: "Endocrinology

## 2019-12-11 ENCOUNTER — Ambulatory Visit: Payer: Medicare Other | Admitting: Nurse Practitioner

## 2019-12-11 ENCOUNTER — Other Ambulatory Visit: Payer: Self-pay

## 2019-12-11 ENCOUNTER — Ambulatory Visit: Payer: Medicare Other | Admitting: Nutrition

## 2019-12-11 DIAGNOSIS — E1165 Type 2 diabetes mellitus with hyperglycemia: Secondary | ICD-10-CM

## 2019-12-18 ENCOUNTER — Encounter: Payer: Self-pay | Admitting: Nurse Practitioner

## 2019-12-18 ENCOUNTER — Encounter: Payer: Medicare Other | Admitting: Nutrition

## 2019-12-18 ENCOUNTER — Other Ambulatory Visit: Payer: Self-pay

## 2019-12-18 ENCOUNTER — Other Ambulatory Visit: Payer: Self-pay | Admitting: "Endocrinology

## 2019-12-18 ENCOUNTER — Ambulatory Visit (INDEPENDENT_AMBULATORY_CARE_PROVIDER_SITE_OTHER): Payer: Medicare Other | Admitting: Nurse Practitioner

## 2019-12-18 VITALS — BP 169/78 | HR 83 | Ht 64.0 in | Wt 243.6 lb

## 2019-12-18 DIAGNOSIS — E039 Hypothyroidism, unspecified: Secondary | ICD-10-CM

## 2019-12-18 DIAGNOSIS — E782 Mixed hyperlipidemia: Secondary | ICD-10-CM | POA: Diagnosis not present

## 2019-12-18 DIAGNOSIS — E1165 Type 2 diabetes mellitus with hyperglycemia: Secondary | ICD-10-CM | POA: Diagnosis not present

## 2019-12-18 DIAGNOSIS — I10 Essential (primary) hypertension: Secondary | ICD-10-CM | POA: Diagnosis not present

## 2019-12-18 LAB — POCT GLYCOSYLATED HEMOGLOBIN (HGB A1C): Hemoglobin A1C: 8.4 % — AB (ref 4.0–5.6)

## 2019-12-18 MED ORDER — GLIPIZIDE ER 10 MG PO TB24: 10.0000 mg | ORAL_TABLET | Freq: Every day | ORAL | 3 refills | Status: DC

## 2019-12-18 NOTE — Patient Instructions (Signed)

## 2019-12-18 NOTE — Progress Notes (Signed)
12/18/2019, 3:24 PM   Endocrinology follow-up note    Subjective:    Patient ID: Jaime Waller, female    DOB: [birth year].  Jaime Waller is being seen in follow-up after she was seen in consultation for  management of currently uncontrolled symptomatic diabetes requested by  Georgiann HahnShroff, Sharukh D, MD.   Past Medical History:  Diagnosis Date  . Anxiety   . Asthma   . Fibromyalgia   . Hypertension   . Hypothyroidism   . Mixed hyperlipidemia   . Opioid abuse (HCC)   . Osteopenia   . Restless leg   . Type II diabetes mellitus, uncontrolled (HCC)   . Venous insufficiency   . Vitamin D deficiency      Social History   Socioeconomic History  . Marital status: Married    Spouse name: Not on file  . Number of children: Not on file  . Years of education: Not on file  . Highest education level: Not on file  Occupational History  . Not on file  Tobacco Use  . Smoking status: Former Smoker    Packs/day: 1.50    Quit date: 1987    Years since quitting: 34.8  . Smokeless tobacco: Never Used  Vaping Use  . Vaping Use: Never used  Substance and Sexual Activity  . Alcohol use: Never  . Drug use: Never  . Sexual activity: Not on file  Other Topics Concern  . Not on file  Social History Narrative  . Not on file   Social Determinants of Health   Financial Resource Strain:   . Difficulty of Paying Living Expenses: Not on file  Food Insecurity:   . Worried About Programme researcher, broadcasting/film/videounning Out of Food in the Last Year: Not on file  . Ran Out of Food in the Last Year: Not on file  Transportation Needs:   . Lack of Transportation (Medical): Not on file  . Lack of Transportation (Non-Medical): Not on file  Physical Activity:   . Days of Exercise per Week: Not on file  . Minutes of Exercise per Session: Not on file  Stress:   . Feeling of Stress : Not on file  Social Connections:   . Frequency of Communication with  Friends and Family: Not on file  . Frequency of Social Gatherings with Friends and Family: Not on file  . Attends Religious Services: Not on file  . Active Member of Clubs or Organizations: Not on file  . Attends BankerClub or Organization Meetings: Not on file  . Marital Status: Not on file    Family History  Problem Relation Age of Onset  . Diabetes Mellitus II Mother   . Thyroid disease Mother   . Hypertension Mother   . CAD Mother   . Kidney disease Mother   . Heart failure Mother   . Cancer Mother   . Hypertension Brother   . Diabetes Mellitus II Brother     Outpatient Encounter Medications as of 12/18/2019  Medication Sig  . Accu-Chek FastClix Lancets MISC Use as directed to test blood glucose two times daily  . albuterol (VENTOLIN HFA) 108 (90 Base) MCG/ACT inhaler 4 (four) times daily as  needed.  . ALPRAZolam (XANAX) 0.5 MG tablet daily.  Marland Kitchen aspirin EC 81 MG tablet Take 81 mg by mouth daily.  . B-D UF III MINI PEN NEEDLES 31G X 5 MM MISC USE 1 PEN NEEDLE 3 TIMES DAILY AS DIRECTED  . benzonatate (TESSALON) 200 MG capsule daily as needed.  . brimonidine (ALPHAGAN) 0.2 % ophthalmic solution   . clonazePAM (KLONOPIN) 0.5 MG disintegrating tablet Take 0.5 mg by mouth 3 (three) times daily.   Marland Kitchen dicyclomine (BENTYL) 10 MG capsule 10 mg 3 (three) times daily before meals.   . DULoxetine (CYMBALTA) 60 MG capsule Take 60 mg by mouth daily.   . fluticasone (FLONASE) 50 MCG/ACT nasal spray 2 sprays daily.  . furosemide (LASIX) 40 MG tablet daily.  Marland Kitchen gabapentin (NEURONTIN) 300 MG capsule at bedtime.  Marland Kitchen glipiZIDE (GLUCOTROL XL) 10 MG 24 hr tablet Take 1 tablet (10 mg total) by mouth daily with breakfast.  . glucose blood test strip Use as instructed to test blood glucose four times daily before meals and bedtime. ONE TOUCH Ultra Blue Test strip  . HYDROcodone-acetaminophen (NORCO/VICODIN) 5-325 MG tablet 2 (two) times daily.  . lansoprazole (PREVACID) 30 MG capsule 30 mg 2 (two) times daily  before a meal.   . levocetirizine (XYZAL) 5 MG tablet Take 5 mg by mouth daily as needed.  . liraglutide (VICTOZA) 18 MG/3ML SOPN Inject into the skin.  . meloxicam (MOBIC) 7.5 MG tablet Take 7.5 mg by mouth daily.  . metFORMIN (GLUCOPHAGE) 1000 MG tablet Take 1 tablet (1,000 mg total) by mouth 2 (two) times daily.  . metoprolol tartrate (LOPRESSOR) 25 MG tablet Take 25 mg by mouth 2 (two) times daily with a meal.  . nitrofurantoin (MACRODANTIN) 50 MG capsule Take 50 mg by mouth daily.  Marland Kitchen NOVOLIN 70/30 FLEXPEN (70-30) 100 UNIT/ML KwikPen INJECT 70 UNITS SUBCUTANEOUSLY TWICE DAILY BEFORE A MEAL  . nystatin cream (MYCOSTATIN) 2 (two) times daily.  . phenazopyridine (PYRIDIUM) 200 MG tablet Take 200 mg by mouth as needed.  . pramipexole (MIRAPEX) 1 MG tablet Take 1 tablet by mouth 2 (two) times daily.   . predniSONE (DELTASONE) 5 MG tablet   . STOOL SOFTENER 100 MG capsule 2 capsules daily.  Marland Kitchen SYNTHROID 100 MCG tablet TAKE 1 TABLET BY MOUTH ONCE DAILY BEFORE BREAKFAST  . triamcinolone cream (KENALOG) 0.1 %   . triamterene-hydrochlorothiazide (DYAZIDE) 37.5-25 MG capsule Take 1 capsule by mouth daily.  . Vitamin D, Ergocalciferol, (DRISDOL) 1.25 MG (50000 UT) CAPS capsule Take 50,000 Units by mouth once a week.  . [DISCONTINUED] famotidine (PEPCID) 20 MG tablet 2 (two) times daily.  . [DISCONTINUED] glipiZIDE (GLUCOTROL XL) 10 MG 24 hr tablet Take 1 tablet (10 mg total) by mouth daily with breakfast.  . [DISCONTINUED] glipiZIDE (GLUCOTROL XL) 5 MG 24 hr tablet Take 1 tablet by mouth once daily with breakfast  . [DISCONTINUED] pantoprazole (PROTONIX) 40 MG tablet Take 40 mg by mouth daily.  . Cholecalciferol 25 MCG (1000 UT) capsule Take by mouth. (Patient not taking: Reported on 12/18/2019)  . colesevelam (WELCHOL) 625 MG tablet Take by mouth. (Patient not taking: Reported on 12/18/2019)  . liraglutide (VICTOZA) 18 MG/3ML SOPN Inject 1.8 mg into the skin daily.   No facility-administered encounter  medications on file as of 12/18/2019.    ALLERGIES: Allergies  Allergen Reactions  . Amoxicillin   . Dapagliflozin   . Ezetimibe   . Hydroxychloroquine   . Lisinopril Swelling  . Metronidazole   . Naproxen   .  Pioglitazone   . Plaquenil  [Hydroxychloroquine Sulfate]   . Prednisone   . Statins     VACCINATION STATUS:  There is no immunization history on file for this patient.  Diabetes She presents for her follow-up diabetic visit. She has type 2 diabetes mellitus. Onset time: She was diagnosed at approximate age of 16 years.  She has a previous history of gestational diabetes. Her disease course has been worsening. There are no hypoglycemic associated symptoms. Pertinent negatives for hypoglycemia include no confusion, headaches, pallor or seizures. Associated symptoms include foot paresthesias, polydipsia and polyuria. Pertinent negatives for diabetes include no chest pain, no fatigue and no polyphagia. There are no hypoglycemic complications. Symptoms are worsening. Diabetic complications include nephropathy and peripheral neuropathy. Risk factors for coronary artery disease include diabetes mellitus, dyslipidemia, family history, hypertension, obesity, sedentary lifestyle, post-menopausal and tobacco exposure. Current diabetic treatment includes insulin injections and oral agent (dual therapy) (She recently ran out of her [place].  She was supposed to be on Tresiba 80 units nightly,  Victoza 1.8 mg daily, metformin 1000 mg p.o. daily, and glipizide 2.5 mg.). She is compliant with treatment most of the time. Her weight is increasing rapidly. She is following a generally unhealthy diet. When asked about meal planning, she reported none. She has had a previous visit with a dietitian. She never participates in exercise. Her home blood glucose trend is fluctuating minimally. Her breakfast blood glucose range is generally >200 mg/dl. Her lunch blood glucose range is generally >200 mg/dl. Her  dinner blood glucose range is generally >200 mg/dl. Her bedtime blood glucose range is generally >200 mg/dl. Her overall blood glucose range is >200 mg/dl. (She presents today, accompanied by her daughter, with her meter and logs showing inconsistent monitoring pattern with consistent hyperglycemia.  Her POCT A1C today is 8.4%, worsening from last visit of 8.2%.  She has had multiple medical issues since last visit including "burning mouth syndrome" and an unknown skin condition to her legs requiring longer courses of oral steroids.  She has gained 23 lbs since last visit, reports she has frequent snacks.  There are no documented or reported episodes of hypoglycemia.  Analysis of her meter shows 7-day average of 224 with 10 readings, 14-day average of 237 with 20 readings, and 30-day average of 220 with 36 readings. ) An ACE inhibitor/angiotensin II receptor blocker is contraindicated. She sees a podiatrist.Eye exam is current.  Thyroid Problem Presents for follow-up visit. Onset time: She is saying that she was diagnosed with hypothyroidism more than 20 years ago, has been on her current dose of levothyroxine 75 mcg for more than 5 years. Patient reports no cold intolerance, diarrhea, fatigue, heat intolerance or palpitations. The symptoms have been stable. Her past medical history is significant for diabetes, hyperlipidemia, neuropathy and obesity. Risk factors include family history of hypothyroidism.  Hyperlipidemia This is a chronic problem. The current episode started more than 1 year ago. The problem is controlled. Recent lipid tests were reviewed and are normal. Exacerbating diseases include diabetes, hypothyroidism and obesity. Pertinent negatives include no chest pain, myalgias or shortness of breath. She is currently on no antihyperlipidemic treatment (statin intolerant.). Compliance problems include adherence to diet and adherence to exercise.  Risk factors for coronary artery disease include  diabetes mellitus, dyslipidemia, obesity, a sedentary lifestyle, post-menopausal, hypertension and family history.    Review of systems  Constitutional: +rapidly increasing body weight,  current Body mass index is 41.81 kg/m. , + fatigue, no subjective hyperthermia, no  subjective hypothermia Eyes: no blurry vision, no xerophthalmia ENT: + sore throat, no nodules palpated in throat, no dysphagia/odynophagia, no hoarseness, recently diagnosed with burning mouth syndrome Cardiovascular: no chest pain, no shortness of breath, no palpitations, + leg swelling Respiratory: no cough, no shortness of breath Gastrointestinal: no nausea/vomiting/diarrhea Musculoskeletal: no muscle/joint aches, disequilibrium with falls Skin: + rashes to abdominal folds, mouth and legs, no hyperemia Neurological: no tremors, no numbness, no tingling, no dizziness Psychiatric: no depression, no anxiety   Objective:    BP (!) 169/78 (BP Location: Right Arm, Patient Position: Sitting)   Pulse 83   Ht  (1.626 m)   Wt 243 lb 9.6 oz (110.5 kg)   BMI 41.81 kg/m   Wt Readings from Last 3 Encounters:  12/18/19 243 lb 9.6 oz (110.5 kg)  08/08/19 220 lb 9.6 oz (100.1 kg)  08/08/19 220 lb (99.8 kg)    BP Readings from Last 3 Encounters:  12/18/19 (!) 169/78  08/08/19 137/82  06/27/19 (!) 142/83    Physical Exam- Limited  Constitutional:  Body mass index is 41.81 kg/m. , not in acute distress, normal state of mind Eyes:  EOMI, no exophthalmos Neck: Supple Thyroid: No gross goiter Cardiovascular: RRR, no murmers, rubs, or gallops, + BLE edema Respiratory: Adequate breathing efforts, no crackles, rales, rhonchi, or wheezing Musculoskeletal: no gross deformities, strength intact in all four extremities, no gross restriction of joint movements Neurological: no tremor with outstretched hands   Recent Results (from the past 2160 hour(s))  HgB A1c     Status: Abnormal   Collection Time: 12/18/19  3:08 PM   Result Value Ref Range   Hemoglobin A1C 8.4 (A) 4.0 - 5.6 %   HbA1c POC (<> result, manual entry)     HbA1c, POC (prediabetic range)     HbA1c, POC (controlled diabetic range)        Assessment & Plan:   1. Uncontrolled type 2 diabetes mellitus with hyperglycemia (HCC)  - Rindy Kollman has currently uncontrolled symptomatic type 2 DM since  74 years of age.   She presents today, accompanied by her daughter, with her meter and logs showing inconsistent monitoring pattern with consistent hyperglycemia.  Her POCT A1C today is 8.4%, worsening from last visit of 8.2%.  She has had multiple medical issues since last visit including "burning mouth syndrome" and an unknown skin condition to her legs requiring longer courses of oral steroids.  She has gained 23 lbs since last visit, reports she has frequent snacks.  There are no documented or reported episodes of hypoglycemia.  Analysis of her meter shows 7-day average of 224 with 10 readings, 14-day average of 237 with 20 readings, and 30-day average of 220 with 36 readings.  - Recent labs reviewed. - I had a long discussion with her about the progressive nature of diabetes and the pathology behind its complications. -her diabetes is complicated by obesity/sedentary life, peripheral neuropathy and she remains at a high risk for more acute and chronic complications which include CAD, CVA, CKD, retinopathy, and neuropathy. These are all discussed in detail with her.  - Nutritional counseling repeated at each appointment due to patients tendency to fall back in to old habits.  - The patient admits there is a room for improvement in their diet and drink choices. -  Suggestion is made for the patient to avoid simple carbohydrates from their diet including Cakes, Sweet Desserts / Pastries, Ice Cream, Soda (diet and regular), Sweet Tea, Candies, Chips, Cookies,  Sweet Pastries,  Store Bought Juices, Alcohol in Excess of  1-2 drinks a day, Artificial  Sweeteners, Coffee Creamer, and "Sugar-free" Products. This will help patient to have stable blood glucose profile and potentially avoid unintended weight gain.   - I encouraged the patient to switch to  unprocessed or minimally processed complex starch and increased protein intake (animal or plant source), fruits, and vegetables.   - Patient is advised to stick to a routine mealtimes to eat 3 meals  a day and avoid unnecessary snacks ( to snack only to correct hypoglycemia).  - I have approached her with the following individualized plan to manage  her diabetes and patient agrees:   - she will continue to need multiple daily injections of insulin in order for her to achieve and maintain control of diabetes to target.    -I suspect she has been missing opportunities to inject premixed insulin due to inconsistent monitoring pattern.  She is advised to continue Novolin 70/30 70 units twice daily with breakfast and supper if glucose is above 90 and she is eating.  She is advised to continue Metformin 1000 mg po twice daily with meals and Victoza 1.8 mg SQ daily.  She will tolerate increase in her Glipizide to 10 mg XL daily with breakfast.  -She is encouraged to consistently monitor glucose at least 3 times per day, before injecting insulin (at breakfast and supper) and before bed.  She is encouraged to call the clinic if she has readings less than 70 or greater than 300 for 3 tests in a row.  - she is warned not to take insulin without proper monitoring per orders.  - Patient specific target  A1c;  LDL, HDL, Triglycerides, were discussed in detail.  2) Blood Pressure /Hypertension: Her blood pressure is not controlled to target.  She is advised to continue Lasix 40 mg po daily, Metoprolol 25 mg po twice daily, and Triamterene-HCT 37.5-25 mg po daily.  Will consider med change at next visit if BP remains elevated.  3) Lipids/Hyperlipidemia:  Her most recent lipid panel from 04/04/19 shows  controlled LDL of 84.  She is not currently on lipid lowering agents.  She is intolerant to statins and has Zetia listed as an allergy.  She was on Repatha previously, but insurance stopped covering it so she stopped.  Will recheck lipid panel prior to next visit.  4)  Weight/Diet:  Her Body mass index is 41.81 kg/m.-  clearly complicating her diabetes care.  She is a candidate for modest weight loss.  I discussed with her the fact that loss of 5 - 10% of her  current body weight will have the most impact on her diabetes management.  CDE Consult will be initiated . Exercise, and detailed carbohydrates information provided  -  detailed on discharge instructions.  5) Hypothyroidism-longstanding -There are no recent TFTs to review.  She is advised to continue Levothyroxine 100 mcg po daily before breakfast.  Will recheck thyroid function tests prior to next visit and adjust dose if needed.   - We discussed about the correct intake of her thyroid hormone, on empty stomach at fasting, with water, separated by at least 30 minutes from breakfast and other medications,  and separated by more than 4 hours from calcium, iron, multivitamins, acid reflux medications (PPIs). -Patient is made aware of the fact that thyroid hormone replacement is needed for life, dose to be adjusted by periodic monitoring of thyroid function tests.  6) Chronic Care/Health Maintenance: -  she is not on ACEI/ARB or Statin medications due to multiple allergies and is encouraged to initiate and continue to follow up with Ophthalmology, Dentist,  Podiatrist at least yearly or according to recommendations, and advised to  stay away from smoking. I have recommended yearly flu vaccine and pneumonia vaccine at least every 5 years; moderate intensity exercise for up to 150 minutes weekly; and  sleep for at least 7 hours a day.  - she is advised to locate her PMD for primary care needs, as well as her other providers for optimal and coordinated  care.  - Time spent on this patient care encounter:  35 min, of which > 50% was spent in  counseling and the rest reviewing her blood glucose logs , discussing her hypoglycemia and hyperglycemia episodes, reviewing her current and  previous labs / studies  ( including abstraction from other facilities) and medications  doses and developing a  long term treatment plan and documenting her care.   Please refer to Patient Instructions for Blood Glucose Monitoring and Insulin/Medications Dosing Guide"  in media tab for additional information. Please  also refer to " Patient Self Inventory" in the Media  tab for reviewed elements of pertinent patient history.  Jaime Prude participated in the discussions, expressed understanding, and voiced agreement with the above plans.  All questions were answered to her satisfaction. she is encouraged to contact clinic should she have any questions or concerns prior to her return visit.   Follow up plan: - Return in about 3 months (around 03/19/2020) for Diabetes follow up- A1c and urine micro in office, Thyroid follow up, Previsit labs.  Ronny Bacon, [facility] [address] [place], [place] [address] Phone: [phone] Fax: [phone]  12/18/2019, 3:24 PM

## 2019-12-19 ENCOUNTER — Other Ambulatory Visit: Payer: Self-pay | Admitting: "Endocrinology

## 2019-12-19 ENCOUNTER — Ambulatory Visit: Payer: Medicare Other | Admitting: Nurse Practitioner

## 2019-12-29 ENCOUNTER — Other Ambulatory Visit: Payer: Self-pay

## 2019-12-29 DIAGNOSIS — E1165 Type 2 diabetes mellitus with hyperglycemia: Secondary | ICD-10-CM

## 2019-12-29 MED ORDER — ACCU-CHEK FASTCLIX LANCETS MISC: 1 refills | Status: DC

## 2020-01-08 ENCOUNTER — Other Ambulatory Visit: Payer: Self-pay | Admitting: "Endocrinology

## 2020-01-08 DIAGNOSIS — E1165 Type 2 diabetes mellitus with hyperglycemia: Secondary | ICD-10-CM

## 2020-02-26 ENCOUNTER — Telehealth: Payer: Self-pay

## 2020-02-26 ENCOUNTER — Other Ambulatory Visit: Payer: Self-pay | Admitting: Nurse Practitioner

## 2020-02-26 MED ORDER — NOVOLOG MIX 70/30 FLEXPEN (70-30) 100 UNIT/ML ~~LOC~~ SUPN: 70.0000 [IU] | PEN_INJECTOR | Freq: Two times a day (BID) | SUBCUTANEOUS | 3 refills | Status: DC

## 2020-02-26 NOTE — Telephone Encounter (Signed)
Patient made aware, will call if any issues

## 2020-02-26 NOTE — Telephone Encounter (Signed)
I sent in Rx for Novolog 70/30 to [facility].  I just now saw that it needed to go to [facility] in [place] (I'll resend it there). It looks like it should be covered by her insurance.  She can let us know if it isn't and we can try something else.

## 2020-02-26 NOTE — Telephone Encounter (Signed)
Pt said that her insurance will not cover her Relion Novolin 30/70. Please advise.  Call back [phone]. Pharmacy is [facility] [address] in [place].

## 2020-02-26 NOTE — Telephone Encounter (Signed)
Patient stated that it was going to be $150 to fill the 70/30 insulin and you had told her at the last visit you were going to change it when she finished with her current supply to something else, please advise. Also wanted you to know that she had a CT scan and she has a kidney stone and diverticulitis.

## 2020-03-05 ENCOUNTER — Other Ambulatory Visit: Payer: Self-pay | Admitting: Nurse Practitioner

## 2020-03-05 NOTE — Telephone Encounter (Signed)
noted 

## 2020-03-05 NOTE — Telephone Encounter (Signed)
Please advise 

## 2020-03-05 NOTE — Telephone Encounter (Signed)
She is having trouble affording her medications.  She says she called her insurance agency and they are sending her a Formulary book so we can choose the best option for her by price.  She expects this book to be here in the next couple of days.  Right now, 1 months worth of Novolog 70/30 is going to cost her $400 which she cannot afford.  She also has diverticulitis and a kidney stone which may be contributing to her high glucose readings over the last 2 weeks (more likely due to her running out of insulin).  I discussed giving her samples of long acting insulin (Basaglar, Evaristo Bury, Toujeo) and fast-acting insulin (Apidra, etc) until we can figure her insurance issue out.  She will come by tomorrow and pick up samples from the office.  I have written instructions on glucose monitoring form for her to take with her samples.

## 2020-03-05 NOTE — Telephone Encounter (Signed)
Patient, and patient daughter called and are very unhappy that the medicine that was sent in was over 3,000 and then they got it lowered to 800. She states her readings are high and she has no insulin and does not know what to do. She reached out to her insurance company to see which insulin they would cover and they told her that the office should be the ones calling them so that we can give them the dosage on which insulin she would be on to give them the cheapest insulin name. Patient is requesting a call back today. [phone]

## 2020-03-06 NOTE — Telephone Encounter (Signed)
Ok, lets wait on her to bring in her formulary book.  Then we can have a visit with her to decide the best course moving forward and we can start the Patient Assistance process for whatever we decide.

## 2020-03-06 NOTE — Telephone Encounter (Signed)
Pt came in and got her samples Jaime Waller gave her. She said she can not fill out patient assistance form until she is actually prescribed something to the pharmacy. Please advise.

## 2020-03-07 NOTE — Telephone Encounter (Signed)
I spoke with patient and advised, verbalized understanding

## 2020-03-08 ENCOUNTER — Institutional Professional Consult (permissible substitution): Payer: Medicare Other | Admitting: Plastic Surgery

## 2020-03-14 LAB — HEPATIC FUNCTION PANEL
ALT: 47 — AB (ref 7–35)
AST: 70 — AB (ref 13–35)
Alkaline Phosphatase: 84 (ref 25–125)
Bilirubin, Total: 0.3

## 2020-03-14 LAB — LIPID PANEL
Cholesterol: 219 — AB (ref 0–200)
HDL: 52 (ref 35–70)
LDL Cholesterol: 125
Triglycerides: 211 — AB (ref 40–160)

## 2020-03-14 LAB — COMPREHENSIVE METABOLIC PANEL
Albumin: 3.6 (ref 3.5–5.0)
Calcium: 8.7 (ref 8.7–10.7)
GFR calc Af Amer: >60
GFR calc non Af Amer: >60

## 2020-03-14 LAB — BASIC METABOLIC PANEL
BUN: 9 (ref 4–21)
CO2: 29 — AB (ref 13–22)
Chloride: 97 — AB (ref 99–108)
Creatinine: 0.7 (ref 0.5–1.1)
Glucose: 178
Potassium: 3.6 (ref 3.4–5.3)
Sodium: 136 — AB (ref 137–147)

## 2020-03-20 ENCOUNTER — Ambulatory Visit (INDEPENDENT_AMBULATORY_CARE_PROVIDER_SITE_OTHER): Payer: Medicare HMO | Admitting: Nurse Practitioner

## 2020-03-20 ENCOUNTER — Encounter: Payer: Self-pay | Admitting: Nurse Practitioner

## 2020-03-20 ENCOUNTER — Other Ambulatory Visit: Payer: Self-pay

## 2020-03-20 VITALS — BP 154/89 | HR 94 | Ht 64.0 in | Wt 240.0 lb

## 2020-03-20 DIAGNOSIS — E039 Hypothyroidism, unspecified: Secondary | ICD-10-CM

## 2020-03-20 DIAGNOSIS — E1165 Type 2 diabetes mellitus with hyperglycemia: Secondary | ICD-10-CM

## 2020-03-20 DIAGNOSIS — E782 Mixed hyperlipidemia: Secondary | ICD-10-CM

## 2020-03-20 DIAGNOSIS — I10 Essential (primary) hypertension: Secondary | ICD-10-CM | POA: Diagnosis not present

## 2020-03-20 LAB — POCT UA - MICROALBUMIN
Creatinine, POC: 200 mg/dL
Microalbumin Ur, POC: 80 mg/L

## 2020-03-20 LAB — POCT GLYCOSYLATED HEMOGLOBIN (HGB A1C): HbA1c, POC (controlled diabetic range): 9 % — AB (ref 0.0–7.0)

## 2020-03-20 MED ORDER — FREESTYLE LIBRE 14 DAY READER DEVI: 1.0000 | Freq: Once | 0 refills | Status: AC

## 2020-03-20 MED ORDER — FREESTYLE LIBRE 14 DAY SENSOR MISC: 1.0000 | 2 refills | Status: DC

## 2020-03-20 MED ORDER — NOVOLOG MIX 70/30 FLEXPEN (70-30) 100 UNIT/ML ~~LOC~~ SUPN: 70.0000 [IU] | PEN_INJECTOR | Freq: Two times a day (BID) | SUBCUTANEOUS | 3 refills | Status: DC

## 2020-03-20 NOTE — Patient Instructions (Signed)

## 2020-03-20 NOTE — Progress Notes (Signed)
03/20/2020, 4:09 PM   Endocrinology follow-up note    Subjective:    Patient ID: Jaime Waller, female    DOB: [birth year].  Breah Joa is being seen in follow-up after she was seen in consultation for  management of currently uncontrolled symptomatic diabetes requested by  Georgiann Hahn, MD.   Past Medical History:  Diagnosis Date  . Anxiety   . Asthma   . Fibromyalgia   . Hypertension   . Hypothyroidism   . Mixed hyperlipidemia   . Opioid abuse (HCC)   . Osteopenia   . Restless leg   . Type II diabetes mellitus, uncontrolled (HCC)   . Venous insufficiency   . Vitamin D deficiency      Social History   Socioeconomic History  . Marital status: Married    Spouse name: Not on file  . Number of children: Not on file  . Years of education: Not on file  . Highest education level: Not on file  Occupational History  . Not on file  Tobacco Use  . Smoking status: Former Smoker    Packs/day: 1.50    Quit date: 1987    Years since quitting: 35.1  . Smokeless tobacco: Never Used  Vaping Use  . Vaping Use: Never used  Substance and Sexual Activity  . Alcohol use: Never  . Drug use: Never  . Sexual activity: Not on file  Other Topics Concern  . Not on file  Social History Narrative  . Not on file   Social Determinants of Health   Financial Resource Strain: Not on file  Food Insecurity: Not on file  Transportation Needs: Not on file  Physical Activity: Not on file  Stress: Not on file  Social Connections: Not on file    Family History  Problem Relation Age of Onset  . Diabetes Mellitus II Mother   . Thyroid disease Mother   . Hypertension Mother   . CAD Mother   . Kidney disease Mother   . Heart failure Mother   . Cancer Mother   . Hypertension Brother   . Diabetes Mellitus II Brother     Outpatient Encounter Medications as of 03/20/2020  Medication Sig  . Accu-Chek  FastClix Lancets MISC Use as directed to test blood glucose two times daily  . albuterol (VENTOLIN HFA) 108 (90 Base) MCG/ACT inhaler 4 (four) times daily as needed.  . ALPRAZolam (XANAX) 0.5 MG tablet daily.  Marland Kitchen aspirin EC 81 MG tablet Take 81 mg by mouth daily.  . B-D UF III MINI PEN NEEDLES 31G X 5 MM MISC USE 1 PEN NEEDLE 3 TIMES DAILY AS DIRECTED  . benzonatate (TESSALON) 200 MG capsule daily as needed.  . brimonidine (ALPHAGAN) 0.2 % ophthalmic solution   . clonazePAM (KLONOPIN) 0.5 MG disintegrating tablet Take 0.5 mg by mouth 3 (three) times daily.   . colesevelam (WELCHOL) 625 MG tablet Take by mouth.  . Continuous Blood Gluc Receiver (FREESTYLE LIBRE 14 DAY READER) DEVI 1 each by Does not apply route once for 1 dose.  . Continuous Blood Gluc Sensor (FREESTYLE LIBRE 14 DAY SENSOR) MISC Inject 1 each into the skin every 14 (  fourteen) days. Use as directed.  . dicyclomine (BENTYL) 10 MG capsule 10 mg 3 (three) times daily before meals.   . DULoxetine (CYMBALTA) 60 MG capsule Take 60 mg by mouth daily.   . fluticasone (FLONASE) 50 MCG/ACT nasal spray 2 sprays daily.  . furosemide (LASIX) 40 MG tablet daily.  Marland Kitchen gabapentin (NEURONTIN) 300 MG capsule at bedtime.  Marland Kitchen glipiZIDE (GLUCOTROL XL) 10 MG 24 hr tablet Take 1 tablet (10 mg total) by mouth daily with breakfast.  . glucose blood test strip Use as instructed to test blood glucose four times daily before meals and bedtime. ONE TOUCH Ultra Blue Test strip  . HYDROcodone-acetaminophen (NORCO/VICODIN) 5-325 MG tablet 2 (two) times daily.  . insulin aspart protamine - aspart (NOVOLOG MIX 70/30 FLEXPEN) (70-30) 100 UNIT/ML FlexPen Inject 0.7 mLs (70 Units total) into the skin 2 (two) times daily.  . lansoprazole (PREVACID) 30 MG capsule 30 mg 2 (two) times daily before a meal.   . levocetirizine (XYZAL) 5 MG tablet Take 5 mg by mouth daily as needed.  . liraglutide (VICTOZA) 18 MG/3ML SOPN Inject 1.8 mg into the skin daily.  . meloxicam (MOBIC)  7.5 MG tablet Take 7.5 mg by mouth daily.  . metFORMIN (GLUCOPHAGE) 1000 MG tablet Take 1 tablet (1,000 mg total) by mouth 2 (two) times daily.  . metoprolol tartrate (LOPRESSOR) 25 MG tablet Take 25 mg by mouth 2 (two) times daily with a meal.  . nitrofurantoin (MACRODANTIN) 50 MG capsule Take 50 mg by mouth daily.  Marland Kitchen nystatin cream (MYCOSTATIN) 2 (two) times daily.  . phenazopyridine (PYRIDIUM) 200 MG tablet Take 200 mg by mouth as needed.  . pramipexole (MIRAPEX) 1 MG tablet Take 1 tablet by mouth 2 (two) times daily.   . predniSONE (DELTASONE) 5 MG tablet   . STOOL SOFTENER 100 MG capsule 2 capsules daily.  Marland Kitchen SYNTHROID 100 MCG tablet TAKE 1 TABLET BY MOUTH ONCE DAILY BEFORE BREAKFAST  . triamcinolone cream (KENALOG) 0.1 %   . triamterene-hydrochlorothiazide (DYAZIDE) 37.5-25 MG capsule Take 1 capsule by mouth daily.  . Vitamin D, Ergocalciferol, (DRISDOL) 1.25 MG (50000 UT) CAPS capsule Take 50,000 Units by mouth once a week.  . [DISCONTINUED] Cholecalciferol 25 MCG (1000 UT) capsule Take by mouth.  . [DISCONTINUED] insulin aspart protamine - aspart (NOVOLOG MIX 70/30 FLEXPEN) (70-30) 100 UNIT/ML FlexPen Inject 0.7 mLs (70 Units total) into the skin 2 (two) times daily.  . [DISCONTINUED] liraglutide (VICTOZA) 18 MG/3ML SOPN Inject into the skin.   No facility-administered encounter medications on file as of 03/20/2020.    ALLERGIES: Allergies  Allergen Reactions  . Amoxicillin   . Dapagliflozin   . Ezetimibe   . Hydroxychloroquine   . Lisinopril Swelling  . Metronidazole   . Naproxen   . Pioglitazone   . Plaquenil  [Hydroxychloroquine Sulfate]   . Prednisone   . Statins     VACCINATION STATUS:  There is no immunization history on file for this patient.  Diabetes She presents for her follow-up diabetic visit. She has type 2 diabetes mellitus. Onset time: She was diagnosed at approximate age of 53 years.  She has a previous history of gestational diabetes. Her disease course  has been worsening. There are no hypoglycemic associated symptoms. Pertinent negatives for hypoglycemia include no confusion, headaches, pallor or seizures. Associated symptoms include fatigue, foot paresthesias, polydipsia and polyuria. Pertinent negatives for diabetes include no chest pain and no polyphagia. There are no hypoglycemic complications. Symptoms are stable. Diabetic complications include  nephropathy and peripheral neuropathy. Risk factors for coronary artery disease include diabetes mellitus, dyslipidemia, family history, hypertension, obesity, sedentary lifestyle, post-menopausal and tobacco exposure. Current diabetic treatment includes insulin injections and oral agent (dual therapy) (She recently ran out of her [place].  She was supposed to be on Tresiba 80 units nightly,  Victoza 1.8 mg daily, metformin 1000 mg p.o. daily, and glipizide 2.5 mg.). She is compliant with treatment most of the time. Her weight is fluctuating minimally. She is following a generally unhealthy diet. When asked about meal planning, she reported none. She has had a previous visit with a dietitian. She never participates in exercise. Her home blood glucose trend is increasing steadily. Her breakfast blood glucose range is generally >200 mg/dl. Her lunch blood glucose range is generally >200 mg/dl. Her dinner blood glucose range is generally >200 mg/dl. Her bedtime blood glucose range is generally >200 mg/dl. Her overall blood glucose range is >200 mg/dl. (She presents today, accompanied by her daughter, with her meter and logs showing persistently high glycemic profile, both fasting and postprandially.  Her POCT A1c today is 9%, increasing from last visit of 8.4%.  She did run out of her insulin in between visits and due to cost, had to switch to basal and bolus insulin samples from the office.  She has not yet applied for patient assistance programs for her medications for the year.  She denies hypoglycemia.  She has been  dealing with a large kidney stone with frequent UTIs and diverticulitis.) An ACE inhibitor/angiotensin II receptor blocker is contraindicated. She sees a podiatrist.Eye exam is current.  Thyroid Problem Presents for follow-up visit. Onset time: She is saying that she was diagnosed with hypothyroidism more than 20 years ago, has been on her current dose of levothyroxine 75 mcg for more than 5 years. Symptoms include fatigue. Patient reports no cold intolerance, diarrhea, heat intolerance or palpitations. The symptoms have been stable. Her past medical history is significant for diabetes, hyperlipidemia, neuropathy and obesity. Risk factors include family history of hypothyroidism.  Hyperlipidemia This is a chronic problem. The current episode started more than 1 year ago. The problem is controlled. Recent lipid tests were reviewed and are normal. Exacerbating diseases include diabetes, hypothyroidism and obesity. Pertinent negatives include no chest pain, myalgias or shortness of breath. She is currently on no antihyperlipidemic treatment (statin intolerant.). Compliance problems include adherence to diet and adherence to exercise.  Risk factors for coronary artery disease include diabetes mellitus, dyslipidemia, obesity, a sedentary lifestyle, post-menopausal, hypertension and family history.    Review of systems  Constitutional: +rapidly increasing body weight,  current Body mass index is 41.2 kg/m. , + fatigue, no subjective hyperthermia, no subjective hypothermia Eyes: no blurry vision, no xerophthalmia ENT: + sore throat, no nodules palpated in throat, no dysphagia/odynophagia, no hoarseness, recently diagnosed with burning mouth syndrome Cardiovascular: no chest pain, no shortness of breath, no palpitations, + leg swelling Respiratory: no cough, no shortness of breath Gastrointestinal: no nausea/vomiting/diarrhea Musculoskeletal: no muscle/joint aches, disequilibrium with falls Skin: + rashes  to abdominal folds, mouth and legs, no hyperemia Neurological: no tremors, no numbness, no tingling, no dizziness Psychiatric: no depression, no anxiety   Objective:    BP (!) 154/89 (BP Location: Left Arm)   Pulse 94   Ht 5\' 4"  (1.626 m)   Wt 240 lb (108.9 kg)   BMI 41.20 kg/m   Wt Readings from Last 3 Encounters:  03/20/20 240 lb (108.9 kg)  12/18/19 243 lb 9.6 oz (  110.5 kg)  08/08/19 220 lb 9.6 oz (100.1 kg)    BP Readings from Last 3 Encounters:  03/20/20 (!) 154/89  12/18/19 (!) 169/78  08/08/19 137/82    Physical Exam- Limited  Constitutional:  Body mass index is 41.2 kg/m. , not in acute distress, normal state of mind Eyes:  EOMI, no exophthalmos Neck: Supple Thyroid: No gross goiter Cardiovascular: RRR, no murmers, rubs, or gallops, + BLE edema Respiratory: Adequate breathing efforts, no crackles, rales, rhonchi, or wheezing Musculoskeletal: no gross deformities, strength intact in all four extremities, no gross restriction of joint movements Neurological: no tremor with outstretched hands  POCT ABI Results 03/20/20   Right ABI:  1.24      Left ABI:  1.25  Right leg systolic / diastolic: 191/113 mmHg Left leg systolic / diastolic: 193/106 mmHg  Arm systolic / diastolic: 154/89 mmHG  Detailed report will be scanned into patient chart.   Recent Results (from the past 2160 hour(s))  Basic metabolic panel     Status: Abnormal   Collection Time: 03/14/20 12:00 AM  Result Value Ref Range   Glucose 178    BUN 9 4 - 21   CO2 29 (A) 13 - 22   Creatinine 0.7 0.5 - 1.1   Potassium 3.6 3.4 - 5.3   Sodium 136 (A) 137 - 147   Chloride 97 (A) 99 - 108  Comprehensive metabolic panel     Status: None   Collection Time: 03/14/20 12:00 AM  Result Value Ref Range   GFR calc Af Amer >60    GFR calc non Af Amer >60    Calcium 8.7 8.7 - 10.7   Albumin 3.6 3.5 - 5.0  Lipid panel     Status: Abnormal   Collection Time: 03/14/20 12:00 AM  Result Value Ref Range    Triglycerides 211 (A) 40 - 160   Cholesterol 219 (A) 0 - 200   HDL 52 35 - 70   LDL Cholesterol 125   Hepatic function panel     Status: Abnormal   Collection Time: 03/14/20 12:00 AM  Result Value Ref Range   Alkaline Phosphatase 84 25 - 125   ALT 47 (A) 7 - 35   AST 70 (A) 13 - 35   Bilirubin, Total 0.3   HgB A1c     Status: Abnormal   Collection Time: 03/20/20  2:54 PM  Result Value Ref Range   Hemoglobin A1C     HbA1c POC (<> result, manual entry)     HbA1c, POC (prediabetic range)     HbA1c, POC (controlled diabetic range) 9.0 (A) 0.0 - 7.0 %  POCT UA - Microalbumin     Status: Abnormal   Collection Time: 03/20/20  2:54 PM  Result Value Ref Range   Microalbumin Ur, POC 80 mg/L   Creatinine, POC 200 mg/dL   Albumin/Creatinine Ratio, Urine, POC        Assessment & Plan:   1) Uncontrolled type 2 diabetes mellitus with hyperglycemia (HCC)  - Lella Mullany has currently uncontrolled symptomatic type 2 DM since  75 years of age.   She presents today, accompanied by her daughter, with her meter and logs showing persistently high glycemic profile, both fasting and postprandially.  Her POCT A1c today is 9%, increasing from last visit of 8.4%.  She did run out of her insulin in between visits and due to cost, had to switch to basal and bolus insulin samples from the office.  She  has not yet applied for patient assistance programs for her medications for the year.  She denies hypoglycemia.  She has been dealing with a large kidney stone with frequent UTIs and diverticulitis.  - Recent labs reviewed.  - I had a long discussion with her about the progressive nature of diabetes and the pathology behind its complications. -her diabetes is complicated by obesity/sedentary life, peripheral neuropathy and she remains at a high risk for more acute and chronic complications which include CAD, CVA, CKD, retinopathy, and neuropathy. These are all discussed in detail with her.  - Nutritional  counseling repeated at each appointment due to patients tendency to fall back in to old habits.  - The patient admits there is a room for improvement in their diet and drink choices. -  Suggestion is made for the patient to avoid simple carbohydrates from their diet including Cakes, Sweet Desserts / Pastries, Ice Cream, Soda (diet and regular), Sweet Tea, Candies, Chips, Cookies, Sweet Pastries,  Store Bought Juices, Alcohol in Excess of  1-2 drinks a day, Artificial Sweeteners, Coffee Creamer, and "Sugar-free" Products. This will help patient to have stable blood glucose profile and potentially avoid unintended weight gain.   - I encouraged the patient to switch to  unprocessed or minimally processed complex starch and increased protein intake (animal or plant source), fruits, and vegetables.   - Patient is advised to stick to a routine mealtimes to eat 3 meals  a day and avoid unnecessary snacks ( to snack only to correct hypoglycemia).  - I have approached her with the following individualized plan to manage  her diabetes and patient agrees:   - she will continue to need multiple daily injections of insulin in order for her to achieve and maintain control of diabetes to target.  Cost is a major concern for her, she brings her insurance formulary book with her to her appointment today.  - Overall, her glucose monitoring has become more consistent.  She had run out of her insulin since last visit and has been using samples of Basaglar and Apidra to get her through until she could be seen again and discuss treatment possibilities.  -For simplicity reasons, we agreed that premixed insulin is the best choice.  I discussed and reinitiated Novolog 70/30 70 units with breakfast and 70 units with supper if glucose is above 90 and she is eating.  She can continue Victoza 1.8 mg SQ daily, Metformin 1000 mg po twice daily with meals and Glipizide 10 mg XL daily with breakfast.  Her daughter has agreed to help  her in reapplying for Patient assistance programs for her medications.  -She is encouraged to continue consistently monitoring glucose at least 3 times per day, before injecting insulin (at breakfast and supper) and before bed.  She is encouraged to call the clinic if she has readings less than 70 or greater than 300 for 3 tests in a row.  -She could greatly benefit from CGM device to help her control her diabetes.  I discussed and sent in Rx for Freestyle Libre to [facility] in [place].  - she is warned not to take insulin without proper monitoring per orders.  - Patient specific target  A1c;  LDL, HDL, Triglycerides, were discussed in detail.  2) Blood Pressure /Hypertension: Her blood pressure is controlled to target for her age and comorbidities.  She is advised to continue Lasix 40 mg po daily, Metoprolol 25 mg po twice daily, and Triamterene-HCT 37.5-25 mg po  daily.    3) Lipids/Hyperlipidemia:  Her most recent lipid panel from 03/14/20 shows uncontrolled LDL of 121 and elevated triglycerides of 211.  Insurance would not cover her Repatha.  She is intolerant to statins and has Zetia listed as an allergy.  She is advised to limit fried foods and butter and increase her exercise.  4)  Weight/Diet:  Her Body mass index is 41.2 kg/m.-  clearly complicating her diabetes care.  She is a candidate for modest weight loss.  I discussed with her the fact that loss of 5 - 10% of her  current body weight will have the most impact on her diabetes management.  CDE Consult will be initiated . Exercise, and detailed carbohydrates information provided  -  detailed on discharge instructions.  5) Hypothyroidism-longstanding -There are no recent TFTs to review.  She is advised to continue Levothyroxine 100 mcg po daily before breakfast.  Will recheck thyroid function tests prior to next visit and adjust dose if needed.   - We discussed about the correct intake of her thyroid hormone, on empty  stomach at fasting, with water, separated by at least 30 minutes from breakfast and other medications,  and separated by more than 4 hours from calcium, iron, multivitamins, acid reflux medications (PPIs). -Patient is made aware of the fact that thyroid hormone replacement is needed for life, dose to be adjusted by periodic monitoring of thyroid function tests.  6) Chronic Care/Health Maintenance: -she is not on ACEI/ARB or Statin medications due to multiple allergies and is encouraged to initiate and continue to follow up with Ophthalmology, Dentist,  Podiatrist at least yearly or according to recommendations, and advised to  stay away from smoking. I have recommended yearly flu vaccine and pneumonia vaccine at least every 5 years; moderate intensity exercise for up to 150 minutes weekly; and  sleep for at least 7 hours a day.  - she is advised to locate her PMD for primary care needs, as well as her other providers for optimal and coordinated care.  - Time spent on this patient care encounter:  35 min, of which > 50% was spent in  counseling and the rest reviewing her blood glucose logs , discussing her hypoglycemia and hyperglycemia episodes, reviewing her current and  previous labs / studies  ( including abstraction from other facilities) and medications  doses and developing a  long term treatment plan and documenting her care.   Please refer to Patient Instructions for Blood Glucose Monitoring and Insulin/Medications Dosing Guide"  in media tab for additional information. Please  also refer to " Patient Self Inventory" in the Media  tab for reviewed elements of pertinent patient history.  Lanier PrudeGloria Gallion participated in the discussions, expressed understanding, and voiced agreement with the above plans.  All questions were answered to her satisfaction. she is encouraged to contact clinic should she have any questions or concerns prior to her return visit.   Follow up plan: - Return in about 3  months (around 06/17/2020) for Diabetes follow up with A1c in office, Thyroid follow up, Previsit labs, Bring glucometer and logs.  Ronny BaconWhitney Othniel Maret, [facility] HospitalFNP-BC [facility] [address] [place], [place] [address] Phone: [phone] Fax: [phone]  03/20/2020, 4:09 PM

## 2020-04-01 ENCOUNTER — Telehealth: Payer: Self-pay

## 2020-04-01 NOTE — Telephone Encounter (Signed)
Returned call to patient , she stated that she has Patient assistance program papers for [place] to fill out for insulin, asked for more samples of Fiasp and Basaglar as she is now out of them and insurance will not cover. We do not have samples to give , Please advise, Patient is also having Endoscopy done tomorrow morning.

## 2020-04-01 NOTE — Telephone Encounter (Signed)
Pt left a VM for you to return her call.

## 2020-04-02 MED ORDER — NOVOLOG FLEXPEN 100 UNIT/ML ~~LOC~~ SOPN
10.0000 [IU] | PEN_INJECTOR | Freq: Three times a day (TID) | SUBCUTANEOUS | 0 refills | Status: DC
Start: 2020-04-02 — End: 2020-08-13

## 2020-04-02 NOTE — Telephone Encounter (Signed)
We do not have any samples of any short acting insulin, but we do have a different type of long acting insulin we can give her.  She can use [place] or [place] in place of the [place].  We will need to send in a script for short acting insulin to her pharmacy to use in the interim until her patient assistance program papers get completed and processed.

## 2020-04-02 NOTE — Telephone Encounter (Signed)
Pt returned your call.  

## 2020-04-02 NOTE — Telephone Encounter (Signed)
Sent in Rx for 1 month supply of Novolog to her requested pharmacy.

## 2020-04-02 NOTE — Telephone Encounter (Signed)
Patient uses the [facility] in [place] on [address]. For short acting insulin, I wasn't sure what you were going to send in. Patient is aware and will stop in to get sample of tresiba.

## 2020-04-02 NOTE — Telephone Encounter (Signed)
No answer, left VM for patient to return call to advise.

## 2020-04-15 ENCOUNTER — Telehealth: Payer: Self-pay

## 2020-04-15 NOTE — Telephone Encounter (Signed)
Please advise, IDK, still hasn't submitted paperwork for Patient assistance.

## 2020-04-15 NOTE — Telephone Encounter (Signed)
Patient is out of Victoza. She is asking what she needs to do?

## 2020-04-15 NOTE — Telephone Encounter (Signed)
She needs to submit paperwork for the patient assistance program.  We do not currently have samples of either Trulicity or Ozempic at this time.  So she will either have to pay at the pharmacy for the Victoza or go without for now.

## 2020-04-15 NOTE — Telephone Encounter (Signed)
Returned call to patient and she stated she already heard back and didn't need anything.

## 2020-04-16 ENCOUNTER — Other Ambulatory Visit: Payer: Self-pay

## 2020-04-16 DIAGNOSIS — E1165 Type 2 diabetes mellitus with hyperglycemia: Secondary | ICD-10-CM

## 2020-04-16 MED ORDER — OZEMPIC (1 MG/DOSE) 2 MG/1.5ML ~~LOC~~ SOPN: 1.0000 mg | PEN_INJECTOR | SUBCUTANEOUS | 5 refills | Status: DC

## 2020-05-07 ENCOUNTER — Ambulatory Visit (INDEPENDENT_AMBULATORY_CARE_PROVIDER_SITE_OTHER): Payer: Medicare HMO | Admitting: Plastic Surgery

## 2020-05-07 ENCOUNTER — Encounter: Payer: Self-pay | Admitting: Plastic Surgery

## 2020-05-07 ENCOUNTER — Other Ambulatory Visit: Payer: Self-pay

## 2020-05-07 VITALS — BP 138/81 | HR 127 | Ht 64.0 in | Wt 235.0 lb

## 2020-05-07 DIAGNOSIS — M793 Panniculitis, unspecified: Secondary | ICD-10-CM

## 2020-05-07 DIAGNOSIS — E1165 Type 2 diabetes mellitus with hyperglycemia: Secondary | ICD-10-CM

## 2020-05-08 ENCOUNTER — Telehealth: Payer: Self-pay | Admitting: Nurse Practitioner

## 2020-05-08 NOTE — Telephone Encounter (Signed)
Informed patient that her medicine from patient assistance is here. Pt states she will pick up later today

## 2020-05-13 ENCOUNTER — Other Ambulatory Visit: Payer: Self-pay | Admitting: Nurse Practitioner

## 2020-05-14 ENCOUNTER — Encounter: Payer: Self-pay | Admitting: Plastic Surgery

## 2020-05-14 DIAGNOSIS — M793 Panniculitis, unspecified: Secondary | ICD-10-CM | POA: Insufficient documentation

## 2020-05-14 NOTE — Progress Notes (Signed)
Patient ID: Jaime Waller, female    DOB: 07-18-1945, 74 y.o.   MRN: [id number]   Chief Complaint  Patient presents with  . Advice Only  . Skin Problem    The patient is a 75 year old female here for evaluation of her abdomen.  Patient is 5 feet 4 inches tall and weighs 235 pounds.  She complains of back pain and constant rashes in her skin folds due to her pannus.  She has tried creams, lotions and powder without any long-term benefit.  She lost 15 pounds over the past year.  She smoking 35 years ago.  Last medical history includes diabetes, restless leg syndrome keloid, hypertension, hyperlipidemia, thyroid disease and anxiety.  Her previous histories include hysterectomy left carpal tunnel, lower extremity surgery and tonsillectomy.  The patient has not been to [facility] wellness center yet and has not had physical therapy.  It is difficult to tell whether or not she has a hernia due to the size of her pannus but there is no obvious hernia or one that I have been able to palpate.  Her biggest concern is the skin infections and yeast infections she gets in her skin folds.  Her last hemoglobin A1c from February was 9 which was down from 11 prior to that.   Review of Systems  Constitutional: Positive for activity change. Negative for appetite change.  HENT: Negative.   Eyes: Negative.   Respiratory: Negative for chest tightness and shortness of breath.   Cardiovascular: Negative for leg swelling.  Gastrointestinal: Negative for abdominal pain.  Endocrine: Negative.   Genitourinary: Negative.   Musculoskeletal: Positive for back pain.  Skin: Positive for color change and rash.  Hematological: Negative.     Past Medical History:  Diagnosis Date  . Anxiety   . Asthma   . Fibromyalgia   . Hypertension   . Hypothyroidism   . Mixed hyperlipidemia   . Opioid abuse (HCC)   . Osteopenia   . Restless leg   . Type II diabetes mellitus, uncontrolled (HCC)   . Venous  insufficiency   . Vitamin D deficiency     Past Surgical History:  Procedure Laterality Date  . ABDOMINAL HYSTERECTOMY    . CARPAL TUNNEL RELEASE     R Hand  . OTHER SURGICAL HISTORY     Right Foot and Ankle Surgery/fx  . TONSILLECTOMY        Current Outpatient Medications:  .  Accu-Chek FastClix Lancets MISC, Use as directed to test blood glucose two times daily, Disp: 306 each, Rfl: 1 .  albuterol (VENTOLIN HFA) 108 (90 Base) MCG/ACT inhaler, 4 (four) times daily as needed., Disp: , Rfl:  .  ALPRAZolam (XANAX) 0.5 MG tablet, daily., Disp: , Rfl:  .  aspirin EC 81 MG tablet, Take 81 mg by mouth daily., Disp: , Rfl:  .  B-D UF III MINI PEN NEEDLES 31G X 5 MM MISC, USE 1 PEN NEEDLE 3 TIMES DAILY AS DIRECTED, Disp: 100 each, Rfl: 5 .  benzonatate (TESSALON) 200 MG capsule, daily as needed., Disp: , Rfl:  .  brimonidine (ALPHAGAN) 0.2 % ophthalmic solution, , Disp: , Rfl:  .  colesevelam (WELCHOL) 625 MG tablet, Take by mouth., Disp: , Rfl:  .  dicyclomine (BENTYL) 10 MG capsule, 10 mg 3 (three) times daily before meals. , Disp: , Rfl:  .  DULoxetine (CYMBALTA) 60 MG capsule, Take 60 mg by mouth daily. , Disp: , Rfl:  .  fluticasone (  FLONASE) 50 MCG/ACT nasal spray, 2 sprays daily., Disp: , Rfl:  .  furosemide (LASIX) 40 MG tablet, daily., Disp: , Rfl:  .  gabapentin (NEURONTIN) 300 MG capsule, at bedtime., Disp: , Rfl:  .  glipiZIDE (GLUCOTROL XL) 10 MG 24 hr tablet, Take 1 tablet (10 mg total) by mouth daily with breakfast., Disp: 90 tablet, Rfl: 3 .  glucose blood test strip, Use as instructed to test blood glucose four times daily before meals and bedtime. ONE TOUCH Ultra Blue Test strip, Disp: 400 each, Rfl: 3 .  HYDROcodone-acetaminophen (NORCO/VICODIN) 5-325 MG tablet, 2 (two) times daily., Disp: , Rfl:  .  insulin aspart (NOVOLOG FLEXPEN) 100 UNIT/ML FlexPen, Inject 10-16 Units into the skin 3 (three) times daily with meals., Disp: 18 mL, Rfl: 0 .  insulin aspart protamine -  aspart (NOVOLOG MIX 70/30 FLEXPEN) (70-30) 100 UNIT/ML FlexPen, Inject 0.7 mLs (70 Units total) into the skin 2 (two) times daily., Disp: 90 mL, Rfl: 3 .  lansoprazole (PREVACID) 30 MG capsule, 30 mg 2 (two) times daily before a meal. , Disp: , Rfl:  .  levocetirizine (XYZAL) 5 MG tablet, Take 5 mg by mouth daily as needed., Disp: , Rfl:  .  meloxicam (MOBIC) 7.5 MG tablet, Take 7.5 mg by mouth daily., Disp: , Rfl:  .  metFORMIN (GLUCOPHAGE) 1000 MG tablet, Take 1 tablet (1,000 mg total) by mouth 2 (two) times daily., Disp: 180 tablet, Rfl: 1 .  metoprolol tartrate (LOPRESSOR) 25 MG tablet, Take 25 mg by mouth 2 (two) times daily with a meal., Disp: , Rfl:  .  nitrofurantoin (MACRODANTIN) 50 MG capsule, Take 50 mg by mouth daily., Disp: , Rfl:  .  nystatin cream (MYCOSTATIN), 2 (two) times daily., Disp: , Rfl:  .  phenazopyridine (PYRIDIUM) 200 MG tablet, Take 200 mg by mouth as needed., Disp: , Rfl:  .  pramipexole (MIRAPEX) 1 MG tablet, Take 1 tablet by mouth 2 (two) times daily. , Disp: , Rfl:  .  predniSONE (DELTASONE) 5 MG tablet, , Disp: , Rfl:  .  Semaglutide, 1 MG/DOSE, (OZEMPIC, 1 MG/DOSE,) 2 MG/1.5ML SOPN, Inject 1 mg into the skin once a week., Disp: 3 mL, Rfl: 5 .  STOOL SOFTENER 100 MG capsule, 2 capsules daily., Disp: , Rfl:  .  triamcinolone cream (KENALOG) 0.1 %, , Disp: , Rfl:  .  triamterene-hydrochlorothiazide (DYAZIDE) 37.5-25 MG capsule, Take 1 capsule by mouth daily., Disp: , Rfl:  .  Vitamin D, Ergocalciferol, (DRISDOL) 1.25 MG (50000 UT) CAPS capsule, Take 50,000 Units by mouth once a week., Disp: , Rfl:  .  levothyroxine (SYNTHROID) 100 MCG tablet, Take 1 tablet (100 mcg total) by mouth daily before breakfast., Disp: 90 tablet, Rfl: 0   Objective:   Vitals:   05/07/20 1502  BP: 138/81  Pulse: (!) 127  SpO2: 96%    Physical Exam Vitals and nursing note reviewed.  Constitutional:      Appearance: Normal appearance.  HENT:     Head: Normocephalic and atraumatic.   Cardiovascular:     Rate and Rhythm: Normal rate.     Pulses: Normal pulses.  Pulmonary:     Effort: Pulmonary effort is normal.  Abdominal:     General: Abdomen is flat. There is no distension.     Palpations: There is no mass.     Tenderness: There is no abdominal tenderness. There is no guarding.  Musculoskeletal:        General: No swelling or  deformity.  Skin:    General: Skin is warm.     Capillary Refill: Capillary refill takes less than 2 seconds.     Coloration: Skin is not jaundiced.     Findings: Bruising, erythema and rash present.  Neurological:     General: No focal deficit present.     Mental Status: She is alert and oriented to person, place, and time.  Psychiatric:        Mood and Affect: Mood normal.        Behavior: Behavior normal.        Thought Content: Thought content normal.     Assessment & Plan:  Uncontrolled type 2 diabetes mellitus with hyperglycemia (HCC)  Panniculitis  I think the patient would benefit from a panniculectomy.  Her risks are high for complications due to her overall size and diabetes.  I think it would be best if she prepared herself.  Her hemoglobin A1c needs to be in the 6 range.  We can refer her to the healthy weight [facility] wellness center and also physical therapy to see if this may help with her symptoms.  I encouraged her to continue to see her primary care physician regarding the skin issues.  We are happy to see her back in 9 months time.  Pictures were obtained of the patient and placed in the chart with the patient's or guardian's permission.   Alena Bills Saren Corkern, DO

## 2020-06-04 ENCOUNTER — Telehealth: Payer: Self-pay

## 2020-06-04 NOTE — Telephone Encounter (Signed)
Returned call

## 2020-06-04 NOTE — Telephone Encounter (Signed)
[employer] left a VM requesting a call back at [phone]

## 2020-06-05 ENCOUNTER — Telehealth: Payer: Self-pay | Admitting: Nurse Practitioner

## 2020-06-05 NOTE — Telephone Encounter (Signed)
[facility] called and states that they sent [place] papers in regards to CGM and question number 6 was not answered. States that needs to be answered and faxed back. If questions # is [phone]

## 2020-06-05 NOTE — Telephone Encounter (Signed)
resent

## 2020-06-07 ENCOUNTER — Encounter (INDEPENDENT_AMBULATORY_CARE_PROVIDER_SITE_OTHER): Payer: Self-pay

## 2020-06-11 ENCOUNTER — Telehealth: Payer: Self-pay | Admitting: Nurse Practitioner

## 2020-06-11 NOTE — Telephone Encounter (Signed)
[facility] called and said that they are needing to confirm if we received a prescription fax over in regards to this patient. [phone]

## 2020-06-11 NOTE — Telephone Encounter (Signed)
Received and faxed back.

## 2020-06-13 LAB — T4, FREE: Free T4: 1.04 ng/dL (ref 0.82–1.77)

## 2020-06-13 LAB — TSH: TSH: 6.4 u[IU]/mL — ABNORMAL HIGH (ref 0.450–4.500)

## 2020-06-18 ENCOUNTER — Other Ambulatory Visit: Payer: Self-pay

## 2020-06-18 ENCOUNTER — Encounter: Payer: Self-pay | Admitting: Nurse Practitioner

## 2020-06-18 ENCOUNTER — Ambulatory Visit (INDEPENDENT_AMBULATORY_CARE_PROVIDER_SITE_OTHER): Payer: Medicare HMO | Admitting: Nurse Practitioner

## 2020-06-18 VITALS — BP 151/74 | HR 92 | Ht 64.0 in | Wt 219.0 lb

## 2020-06-18 DIAGNOSIS — I10 Essential (primary) hypertension: Secondary | ICD-10-CM

## 2020-06-18 DIAGNOSIS — E1165 Type 2 diabetes mellitus with hyperglycemia: Secondary | ICD-10-CM | POA: Diagnosis not present

## 2020-06-18 DIAGNOSIS — E782 Mixed hyperlipidemia: Secondary | ICD-10-CM

## 2020-06-18 DIAGNOSIS — E039 Hypothyroidism, unspecified: Secondary | ICD-10-CM

## 2020-06-18 LAB — POCT GLYCOSYLATED HEMOGLOBIN (HGB A1C): Hemoglobin A1C: 10.4 % — AB (ref 4.0–5.6)

## 2020-06-18 MED ORDER — NOVOLOG MIX 70/30 FLEXPEN (70-30) 100 UNIT/ML ~~LOC~~ SUPN
25.0000 [IU] | PEN_INJECTOR | Freq: Two times a day (BID) | SUBCUTANEOUS | 3 refills | Status: DC
Start: 2020-06-18 — End: 2020-08-13

## 2020-06-18 MED ORDER — LEVOTHYROXINE SODIUM 112 MCG PO TABS: 112.0000 ug | ORAL_TABLET | Freq: Every day | ORAL | 3 refills | Status: DC

## 2020-06-18 NOTE — Progress Notes (Signed)
06/18/2020, 3:37 PM   Endocrinology follow-up note    Subjective:    Patient ID: Jaime Waller, female    DOB: 1945/09/16.  Dwayne Begay is being seen in follow-up after she was seen in consultation for  management of currently uncontrolled symptomatic diabetes requested by  Jonathon Bellows, DO.   Past Medical History:  Diagnosis Date  . Anxiety   . Asthma   . Fibromyalgia   . Hypertension   . Hypothyroidism   . Mixed hyperlipidemia   . Opioid abuse (HCC)   . Osteopenia   . Restless leg   . Type II diabetes mellitus, uncontrolled (HCC)   . Venous insufficiency   . Vitamin D deficiency      Social History   Socioeconomic History  . Marital status: Married    Spouse name: Not on file  . Number of children: Not on file  . Years of education: Not on file  . Highest education level: Not on file  Occupational History  . Not on file  Tobacco Use  . Smoking status: Former Smoker    Packs/day: 1.50    Quit date: 1987    Years since quitting: 35.3  . Smokeless tobacco: Never Used  Vaping Use  . Vaping Use: Never used  Substance and Sexual Activity  . Alcohol use: Never  . Drug use: Never  . Sexual activity: Not on file  Other Topics Concern  . Not on file  Social History Narrative  . Not on file   Social Determinants of Health   Financial Resource Strain: Not on file  Food Insecurity: Not on file  Transportation Needs: Not on file  Physical Activity: Not on file  Stress: Not on file  Social Connections: Not on file    Family History  Problem Relation Age of Onset  . Diabetes Mellitus II Mother   . Thyroid disease Mother   . Hypertension Mother   . CAD Mother   . Kidney disease Mother   . Heart failure Mother   . Cancer Mother   . Hypertension Brother   . Diabetes Mellitus II Brother     Outpatient Encounter Medications as of 06/18/2020  Medication Sig  . Accu-Chek FastClix  Lancets MISC Use as directed to test blood glucose two times daily  . albuterol (VENTOLIN HFA) 108 (90 Base) MCG/ACT inhaler 4 (four) times daily as needed.  . ALPRAZolam (XANAX) 0.5 MG tablet daily.  Marland Kitchen aspirin EC 81 MG tablet Take 81 mg by mouth daily.  . B-D UF III MINI PEN NEEDLES 31G X 5 MM MISC USE 1 PEN NEEDLE 3 TIMES DAILY AS DIRECTED  . benzonatate (TESSALON) 200 MG capsule daily as needed.  . brimonidine (ALPHAGAN) 0.2 % ophthalmic solution   . colesevelam (WELCHOL) 625 MG tablet Take by mouth.  . dicyclomine (BENTYL) 10 MG capsule 10 mg 3 (three) times daily before meals.   . DULoxetine (CYMBALTA) 60 MG capsule Take 60 mg by mouth daily.   . fluticasone (FLONASE) 50 MCG/ACT nasal spray 2 sprays daily.  . furosemide (LASIX) 40 MG tablet daily.  Marland Kitchen gabapentin (NEURONTIN) 300 MG capsule at bedtime.  Marland Kitchen glipiZIDE (GLUCOTROL  XL) 10 MG 24 hr tablet Take 1 tablet (10 mg total) by mouth daily with breakfast.  . glucose blood test strip Use as instructed to test blood glucose four times daily before meals and bedtime. ONE TOUCH Ultra Blue Test strip  . HYDROcodone-acetaminophen (NORCO/VICODIN) 5-325 MG tablet 2 (two) times daily.  . insulin aspart (NOVOLOG FLEXPEN) 100 UNIT/ML FlexPen Inject 10-16 Units into the skin 3 (three) times daily with meals.  . insulin aspart protamine - aspart (NOVOLOG MIX 70/30 FLEXPEN) (70-30) 100 UNIT/ML FlexPen Inject 0.25 mLs (25 Units total) into the skin 2 (two) times daily with a meal.  . lansoprazole (PREVACID) 30 MG capsule 30 mg 2 (two) times daily before a meal.   . levocetirizine (XYZAL) 5 MG tablet Take 5 mg by mouth daily as needed.  Marland Kitchen levothyroxine (SYNTHROID) 112 MCG tablet Take 1 tablet (112 mcg total) by mouth daily before breakfast.  . meloxicam (MOBIC) 7.5 MG tablet Take 7.5 mg by mouth daily.  . metFORMIN (GLUCOPHAGE) 1000 MG tablet Take 1 tablet (1,000 mg total) by mouth 2 (two) times daily.  . metoprolol tartrate (LOPRESSOR) 25 MG tablet Take  25 mg by mouth 2 (two) times daily with a meal.  . nitrofurantoin (MACRODANTIN) 50 MG capsule Take 50 mg by mouth daily.  Marland Kitchen nystatin cream (MYCOSTATIN) 2 (two) times daily.  . phenazopyridine (PYRIDIUM) 200 MG tablet Take 200 mg by mouth as needed.  . pramipexole (MIRAPEX) 1 MG tablet Take 1 tablet by mouth 2 (two) times daily.   . predniSONE (DELTASONE) 5 MG tablet   . Semaglutide, 1 MG/DOSE, (OZEMPIC, 1 MG/DOSE,) 2 MG/1.5ML SOPN Inject 1 mg into the skin once a week.  . STOOL SOFTENER 100 MG capsule 2 capsules daily.  Marland Kitchen triamcinolone cream (KENALOG) 0.1 %   . triamterene-hydrochlorothiazide (DYAZIDE) 37.5-25 MG capsule Take 1 capsule by mouth daily.  . Vitamin D, Ergocalciferol, (DRISDOL) 1.25 MG (50000 UT) CAPS capsule Take 50,000 Units by mouth once a week.  . [DISCONTINUED] insulin aspart protamine - aspart (NOVOLOG MIX 70/30 FLEXPEN) (70-30) 100 UNIT/ML FlexPen Inject 0.7 mLs (70 Units total) into the skin 2 (two) times daily.  . [DISCONTINUED] levothyroxine (SYNTHROID) 100 MCG tablet Take 1 tablet (100 mcg total) by mouth daily before breakfast.   No facility-administered encounter medications on file as of 06/18/2020.    ALLERGIES: Allergies  Allergen Reactions  . Amoxicillin   . Dapagliflozin   . Ezetimibe   . Hydroxychloroquine   . Lisinopril Swelling  . Metronidazole   . Naproxen   . Pioglitazone   . Plaquenil  [Hydroxychloroquine Sulfate]   . Statins     VACCINATION STATUS:  There is no immunization history on file for this patient.  Diabetes She presents for her follow-up diabetic visit. She has type 2 diabetes mellitus. Onset time: She was diagnosed at approximate age of 58 years.  She has a previous history of gestational diabetes. Her disease course has been worsening. There are no hypoglycemic associated symptoms. Pertinent negatives for hypoglycemia include no confusion, headaches, nervousness/anxiousness, pallor, seizures or tremors. Associated symptoms include  fatigue, foot paresthesias, polydipsia and polyuria. Pertinent negatives for diabetes include no chest pain, no polyphagia and no weight loss. There are no hypoglycemic complications. Symptoms are stable. Diabetic complications include nephropathy and peripheral neuropathy. Risk factors for coronary artery disease include diabetes mellitus, dyslipidemia, family history, hypertension, obesity, sedentary lifestyle, post-menopausal and tobacco exposure. Current diabetic treatment includes insulin injections and oral agent (dual therapy). She is compliant  with treatment most of the time. Her weight is decreasing rapidly. She is following a generally healthy diet. When asked about meal planning, she reported none. She has had a previous visit with a dietitian. She never participates in exercise. Her home blood glucose trend is fluctuating minimally. Her breakfast blood glucose range is generally >200 mg/dl. Her lunch blood glucose range is generally >200 mg/dl. Her dinner blood glucose range is generally >200 mg/dl. Her bedtime blood glucose range is generally >200 mg/dl. Her overall blood glucose range is >200 mg/dl. (She presents today, accompanied by her daughter, with her meter and logs showing persistently high glycemic profile both fasting and postprandially.  Her POCT A1c today is 10.4%, increasing from last visit of 9%.  There was some miscommunication regarding how much premixed insulin to inject twice daily, instead of injecting 70 units BID, they were injecting 16 units TID. She has lost approximately 16 lbs since last visit, a good development for her.  She denies any hypoglycemia.) An ACE inhibitor/angiotensin II receptor blocker is contraindicated. She sees a podiatrist.Eye exam is current.  Thyroid Problem Presents for follow-up visit. Onset time: She is saying that she was diagnosed with hypothyroidism more than 20 years ago, has been on her current dose of levothyroxine 75 mcg for more than 5 years.  Symptoms include fatigue. Patient reports no anxiety, cold intolerance, constipation, depressed mood, diarrhea, heat intolerance, palpitations, tremors, weight gain or weight loss. The symptoms have been stable. Her past medical history is significant for diabetes, hyperlipidemia, neuropathy and obesity. Risk factors include family history of hypothyroidism.  Hyperlipidemia This is a chronic problem. The current episode started more than 1 year ago. The problem is controlled. Recent lipid tests were reviewed and are normal. Exacerbating diseases include diabetes, hypothyroidism and obesity. Factors aggravating her hyperlipidemia include thiazides and fatty foods. Pertinent negatives include no chest pain, myalgias or shortness of breath. She is currently on no antihyperlipidemic treatment (statin intolerant. on welchol). Compliance problems include adherence to diet and adherence to exercise.  Risk factors for coronary artery disease include diabetes mellitus, dyslipidemia, a sedentary lifestyle, post-menopausal, family history, obesity and hypertension.    Review of systems  Constitutional: has lost 16+ lbs since last visit,  current Body mass index is 37.59 kg/m. , + fatigue, no subjective hyperthermia, no subjective hypothermia Eyes: no blurry vision, no xerophthalmia ENT: + sore throat, no nodules palpated in throat, no dysphagia/odynophagia, no hoarseness, recently diagnosed with burning mouth syndrome Cardiovascular: no chest pain, no shortness of breath, no palpitations, + leg swelling Respiratory: no cough, no shortness of breath Gastrointestinal: no nausea/vomiting/diarrhea Musculoskeletal: no muscle/joint aches, disequilibrium with falls Skin: + rashes to abdominal folds (undergoing surgical eval for panniculectomy) , mouth and legs, no hyperemia Neurological: no tremors, no numbness, no tingling, no dizziness Psychiatric: no depression, no anxiety   Objective:    BP (!) 151/74    Pulse 92   Ht  (1.626 m)   Wt 219 lb (99.3 kg)   BMI 37.59 kg/m   Wt Readings from Last 3 Encounters:  06/18/20 219 lb (99.3 kg)  05/07/20 235 lb (106.6 kg)  03/20/20 240 lb (108.9 kg)    BP Readings from Last 3 Encounters:  06/18/20 (!) 151/74  05/07/20 138/81  03/20/20 (!) 154/89    Physical Exam- Limited  Constitutional:  There is no height or weight on file to calculate BMI. , not in acute distress, normal state of mind Eyes:  EOMI, no exophthalmos Neck: Supple  Cardiovascular: RRR, no murmers, rubs, or gallops, + mild BLE edema Respiratory: Adequate breathing efforts, no crackles, rales, rhonchi, or wheezing Musculoskeletal: no gross deformities, strength intact in all four extremities, no gross restriction of joint movements Neurological: no tremor with outstretched hands    Recent Results (from the past 2160 hour(s))  HgB A1c     Status: Abnormal   Collection Time: 03/20/20  2:54 PM  Result Value Ref Range   Hemoglobin A1C     HbA1c POC (<> result, manual entry)     HbA1c, POC (prediabetic range)     HbA1c, POC (controlled diabetic range) 9.0 (A) 0.0 - 7.0 %  POCT UA - Microalbumin     Status: Abnormal   Collection Time: 03/20/20  2:54 PM  Result Value Ref Range   Microalbumin Ur, POC 80 mg/L   Creatinine, POC 200 mg/dL   Albumin/Creatinine Ratio, Urine, POC    TSH     Status: Abnormal   Collection Time: 06/12/20  3:48 PM  Result Value Ref Range   TSH 6.400 (H) 0.450 - 4.500 uIU/mL  T4, free     Status: None   Collection Time: 06/12/20  3:48 PM  Result Value Ref Range   Free T4 1.04 0.82 - 1.77 ng/dL  HgB N8G     Status: Abnormal   Collection Time: 06/18/20  2:20 PM  Result Value Ref Range   Hemoglobin A1C 10.4 (A) 4.0 - 5.6 %   HbA1c POC (<> result, manual entry)     HbA1c, POC (prediabetic range)     HbA1c, POC (controlled diabetic range)        Assessment & Plan:   1) Uncontrolled type 2 diabetes mellitus with hyperglycemia (HCC)  -  Carla Rashad has currently uncontrolled symptomatic type 2 DM since 75 years of age.   She presents today, accompanied by her daughter, with her meter and logs showing persistently high glycemic profile both fasting and postprandially.  Her POCT A1c today is 10.4%, increasing from last visit of 9%.  There was some miscommunication regarding how much premixed insulin to inject twice daily, instead of injecting 70 units BID, they were injecting 16 units TID. She has lost approximately 16 lbs since last visit, a good development for her.  She denies any hypoglycemia.  - Recent labs reviewed.  - I had a long discussion with her about the progressive nature of diabetes and the pathology behind its complications. -her diabetes is complicated by obesity/sedentary life, peripheral neuropathy and she remains at a high risk for more acute and chronic complications which include CAD, CVA, CKD, retinopathy, and neuropathy. These are all discussed in detail with her.  - Nutritional counseling repeated at each appointment due to patients tendency to fall back in to old habits.  - The patient admits there is a room for improvement in their diet and drink choices. -  Suggestion is made for the patient to avoid simple carbohydrates from their diet including Cakes, Sweet Desserts / Pastries, Ice Cream, Soda (diet and regular), Sweet Tea, Candies, Chips, Cookies, Sweet Pastries,  Store Bought Juices, Alcohol in Excess of  1-2 drinks a day, Artificial Sweeteners, Coffee Creamer, and "Sugar-free" Products. This will help patient to have stable blood glucose profile and potentially avoid unintended weight gain.   - I encouraged the patient to switch to  unprocessed or minimally processed complex starch and increased protein intake (animal or plant source), fruits, and vegetables.   - Patient is advised to stick  to a routine mealtimes to eat 3 meals a day and avoid unnecessary snacks ( to snack only to correct  hypoglycemia).  - I have approached her with the following individualized plan to manage  her diabetes and patient agrees:   - she will continue to need multiple daily injections of insulin in order for her to achieve and maintain control of diabetes to target.  Cost is a major concern for her.  -For simplicity reasons, we agreed that premixed insulin is the best choice.  She is advised to increase her premixed insulin 70/30 to 25 units with breakfast and 25 units with supper, if glucose is above 90 and she is eating.  She is also advised to continue Ozempic 1 mg SQ weekly along with her Metformin 1000 mg po twice daily with meals and Glipizide 10 mg XL daily with breakfast.    -She is encouraged to continue consistently monitoring glucose at least 3 times per day, before injecting insulin (at breakfast and supper) and before bed.  She is encouraged to call the clinic if she has readings less than 70 or greater than 300 for 3 tests in a row.  -She could greatly benefit from CGM device to help her control her diabetes.  I discussed and sent in Rx for Freestyle Libre to [facility] in [place].  We received information last week about more information being needed which is now complete.  Awaiting a decision.  - she is warned not to take insulin without proper monitoring per orders.  - Patient specific target  A1c;  LDL, HDL, Triglycerides, were discussed in detail.  2) Blood Pressure /Hypertension: Her blood pressure is controlled to target for her age and comorbidities.  She is advised to continue Lasix 40 mg po daily, Metoprolol 25 mg po twice daily, and Triamterene-HCT 37.5-25 mg po daily.    3) Lipids/Hyperlipidemia:  Her most recent lipid panel from 03/14/20 shows uncontrolled LDL of 121 and elevated triglycerides of 211.  Insurance would not cover her Repatha.  She is intolerant to statins and has Zetia listed as an allergy.  She is taking her Welchol.  She is advised to limit fried  foods and butter and increase her exercise.  4)  Weight/Diet:  Her Body mass index is 37.59 kg/m.-  clearly complicating her diabetes care.  She is a candidate for modest weight loss.  I discussed with her the fact that loss of 5 - 10% of her  current body weight will have the most impact on her diabetes management.  CDE Consult will be initiated . Exercise, and detailed carbohydrates information provided  -  detailed on discharge instructions.  5) Hypothyroidism-longstanding -Her previsit thyroid function tests are consistent with under-replacement.  She is advised to increase her Levothyroxine to 112 mcg po daily before breakfast.     - We discussed about the correct intake of her thyroid hormone, on empty stomach at fasting, with water, separated by at least 30 minutes from breakfast and other medications,  and separated by more than 4 hours from calcium, iron, multivitamins, acid reflux medications (PPIs). -Patient is made aware of the fact that thyroid hormone replacement is needed for life, dose to be adjusted by periodic monitoring of thyroid function tests.  6) Chronic Care/Health Maintenance: -she is not on ACEI/ARB or Statin medications due to multiple allergies and is encouraged to initiate and continue to follow up with Ophthalmology, Dentist,  Podiatrist at least yearly or according to recommendations, and advised  to  stay away from smoking. I have recommended yearly flu vaccine and pneumonia vaccine at least every 5 years; moderate intensity exercise for up to 150 minutes weekly; and  sleep for at least 7 hours a day.  - she is advised to locate her PMD for primary care needs, as well as her other providers for optimal and coordinated care.    I spent 40 minutes in the care of the patient today including review of labs from CMP, Lipids, Thyroid Function, Hematology (current and previous including abstractions from other facilities); face-to-face time discussing  her blood glucose  readings/logs, discussing hypoglycemia and hyperglycemia episodes and symptoms, medications doses, her options of short and long term treatment based on the latest standards of care / guidelines;  discussion about incorporating lifestyle medicine;  and documenting the encounter.    Please refer to Patient Instructions for Blood Glucose Monitoring and Insulin/Medications Dosing Guide"  in media tab for additional information. Please  also refer to " Patient Self Inventory" in the Media  tab for reviewed elements of pertinent patient history.  Lanier PrudeGloria Hayhurst participated in the discussions, expressed understanding, and voiced agreement with the above plans.  All questions were answered to her satisfaction. she is encouraged to contact clinic should she have any questions or concerns prior to her return visit.   Follow up plan: - Return in about 3 months (around 09/18/2020) for Diabetes F/U with A1c in office, Bring meter and logs, Thyroid follow up, Previsit labs.  Ronny BaconWhitney Earl Losee, [facility] LLCFNP-BC [facility] [address] [place], [place] [address] Phone: [phone] Fax: [phone]  06/18/2020, 3:37 PM

## 2020-06-18 NOTE — Patient Instructions (Signed)
Advice for Weight Management  -For most of us the best way to lose weight is by diet management. Generally speaking, diet management means consuming less calories intentionally which over time brings about progressive weight loss.  This can be achieved more effectively by restricting carbohydrate consumption to the minimum possible.  So, it is critically important to know your numbers: how much calorie you are consuming and how much calorie you need. More importantly, our carbohydrates sources should be unprocessed or minimally processed complex starch food items.   Sometimes, it is important to balance nutrition by increasing protein intake (animal or plant source), fruits, and vegetables.  -Sticking to a routine mealtime to eat 3 meals a day and avoiding unnecessary snacks is shown to have a big role in weight control. Under normal circumstances, the only time we lose real weight is when we are hungry, so allow hunger to take place- hunger means no food between meal times, only water.  It is not advisable to starve.   -It is better to avoid simple carbohydrates including: Cakes, Sweet Desserts, Ice Cream, Soda (diet and regular), Sweet Tea, Candies, Chips, Cookies, Store Bought Juices, Alcohol in Excess of  1-2 drinks a day, Artificial Sweeteners, Doughnuts, Coffee Creamers, "Sugar-free" Products, etc, etc.  This is not a complete list.....    -Consulting with certified diabetes educators is proven to provide you with the most accurate and current information on diet.  Also, you may be  interested in discussing diet options/exchanges , we can schedule a visit with Jaime Waller, RDN, CDE for individualized nutrition education.  -Exercise: If you are able: 30 -60 minutes a day ,4 days a week, or 150 minutes a week.  The longer the better.  Combine stretch, strength, and aerobic activities.  If you were told in the past that you have high risk for cardiovascular diseases, you may seek evaluation by  your heart doctor prior to initiating moderate to intense exercise programs.    

## 2020-07-04 ENCOUNTER — Other Ambulatory Visit: Payer: Self-pay

## 2020-07-04 ENCOUNTER — Other Ambulatory Visit: Payer: Self-pay | Admitting: "Endocrinology

## 2020-07-04 DIAGNOSIS — E1165 Type 2 diabetes mellitus with hyperglycemia: Secondary | ICD-10-CM

## 2020-07-04 MED ORDER — BLOOD GLUCOSE METER KIT: PACK | 5 refills | Status: DC

## 2020-07-05 ENCOUNTER — Telehealth: Payer: Self-pay

## 2020-07-05 NOTE — Telephone Encounter (Signed)
Patient requesting a call back regarding her insulin

## 2020-07-08 NOTE — Telephone Encounter (Signed)
Pt is calling in regards to readings all taken in the AM  5/19 307  5/20 170  5/21 300  5/22 238  5/23 272

## 2020-07-08 NOTE — Telephone Encounter (Signed)
Does she have any other readings during the day?  I need to see them all to know if we need to change something

## 2020-07-08 NOTE — Telephone Encounter (Signed)
Left VM for pt to test 3 times daily then call back if she still has concerns with her readings

## 2020-07-08 NOTE — Telephone Encounter (Signed)
Pt states that she has only been doing the morning readings.

## 2020-07-08 NOTE — Telephone Encounter (Signed)
She should be checking her glucose at least 3 times daily (before breakfast, before supper, and before bed).  I will not make any changes to her meds just yet for safety purposes.  Please encourage her to start monitoring glucose more frequently and call me back when she has more readings.  Then I should have enough information to make necessary adjustments to her meds.

## 2020-07-22 ENCOUNTER — Telehealth: Payer: Self-pay

## 2020-07-22 NOTE — Telephone Encounter (Signed)
Pt notified and agrees. 

## 2020-07-22 NOTE — Telephone Encounter (Signed)
She should be on 70/30 25 units BID currently.  Let's increase that to 35 units BID for now and continue her other medications as previously prescribed.  She is all over the place with monitoring as well.  It is best if she sticks to a routine of checking her glucose and eating so that she does not have those big fluctuations in sugar.

## 2020-07-22 NOTE — Telephone Encounter (Signed)
No answer. No VM picked up

## 2020-07-22 NOTE — Telephone Encounter (Signed)
5/31- 196 9:48am, 299 1:55pm, 214 11:20pm   6/1- 308 10:54am, 371 1:40pm, did not check it anymore this day 6/2 250 9:58am, 344 3 am did not check it anymore 6/4- 307 6:32am did not check anymore that day 6/5- 259 1:12pm, 349 10:49pm 6/6- 155 10:35 am has not checked it anymore

## 2020-07-25 ENCOUNTER — Encounter: Payer: Medicare HMO | Attending: Nurse Practitioner | Admitting: Nutrition

## 2020-07-25 ENCOUNTER — Encounter: Payer: Self-pay | Admitting: Nutrition

## 2020-07-25 VITALS — Ht 63.5 in | Wt 221.8 lb

## 2020-07-25 DIAGNOSIS — E1165 Type 2 diabetes mellitus with hyperglycemia: Secondary | ICD-10-CM | POA: Diagnosis present

## 2020-07-25 DIAGNOSIS — E782 Mixed hyperlipidemia: Secondary | ICD-10-CM | POA: Diagnosis present

## 2020-07-25 DIAGNOSIS — I10 Essential (primary) hypertension: Secondary | ICD-10-CM | POA: Insufficient documentation

## 2020-07-25 NOTE — Patient Instructions (Signed)
Goals  Eat meals on time Eat 3 2-3 car choices per meals. 30-45 g CHO at meals Cut out snacks between meals Drink 96 oz of water daily. Choose whole grain bread Get FBS less than 150 and less than 180 mg/dl at bedtime.

## 2020-07-25 NOTE — Progress Notes (Signed)
  Medical Nutrition Therapy:  Appt start time: 1430  end time: 1500 Assessment:  Primary concerns today: Diabetes Type 2, Obesity.   She is here with her daughter. Had a kidney stone a few months ago. Swallowing is about the same. EDG showed some deterioration of her tissue in the esophagus. Has  gastroparesis and stomach ulcer. 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 Still has rash/cellulitis on her lower legs. Her mouth continues to burn and has found that eating an apple slice is soothing to her mouth. 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  Lab Results  Component Value Date   HGBA1C 10.4 (A) 06/18/2020   She called to [facility] and they changed her insulin to 35 units BID instead of  25 gm/dl recently due to BS being high over 250's.   Glipizide, Metformin 1000 mg BID, Ozempic weekly  Preferred Learning Style: Auditory Visual Hands on  Learning Readiness:  Ready Change in progress   MEDICATIONS:   DIETARY INTAKE:    24-hr recall:  B ( AM): Oatmeal  Snk ( AM):   L ( PM): slice pork loin, pintos 1/2c, Snk ( PM):  D ( PM):   Snk ( PM): Beverages: coffee,  Usual physical activity:   Estimated energy needs: 1200  calories 133g carbohydrates 90 g protein 33 g fat  Progress Towards Goal(s):  In progress.   Nutritional Diagnosis:  NB-1.1 Food and nutrition-related knowledge deficit As related to DIabetes Type 2.  As evidenced by A1c 7.9%.    Intervention:  Nutrition and Diabetes education provided on My Plate, CHO counting, meal planning, portion sizes, timing of meals, avoiding snacks between meals unless having a low blood sugar, target ranges for A1C and blood sugars, signs/symptoms and treatment of hyper/hypoglycemia, monitoring blood sugars, taking medications as prescribed, benefits of exercising 30 minutes per day and prevention of complications of DM.  Goals  Eat meals on time Eat 3 2-3 car choices per  meals. 30-45 g CHO at meals Cut out snacks between meals Drink 96 oz of water daily. Choose whole grain bread Get FBS less than 150 and less than 180 mg/dl at bedtime.  Teaching Method Utilized:  Visual Auditory Hands on  Handouts given during visit include: The Plate Method  Meal Plan Card  Diabetes instrucitons.   Barriers to learning/adherence to lifestyle change: non3  Demonstrated degree of understanding via:  Teach Back   Monitoring/Evaluation:  Dietary intake, exercise, , and body weight in  3 month(s).

## 2020-07-30 ENCOUNTER — Ambulatory Visit (INDEPENDENT_AMBULATORY_CARE_PROVIDER_SITE_OTHER): Payer: Medicare HMO | Admitting: Family Medicine

## 2020-08-05 ENCOUNTER — Other Ambulatory Visit: Payer: Self-pay

## 2020-08-05 DIAGNOSIS — E1165 Type 2 diabetes mellitus with hyperglycemia: Secondary | ICD-10-CM

## 2020-08-05 MED ORDER — BLOOD GLUCOSE MONITOR KIT: 1.0000 | PACK | Freq: Three times a day (TID) | 0 refills | Status: DC

## 2020-08-06 LAB — HEPATIC FUNCTION PANEL
ALT: 34 (ref 7–35)
AST: 46 — AB (ref 13–35)

## 2020-08-06 LAB — COMPREHENSIVE METABOLIC PANEL: GFR calc non Af Amer: 90

## 2020-08-06 LAB — LIPID PANEL
Cholesterol: 202 — AB (ref 0–200)
LDL Cholesterol: 114
Triglycerides: 195 — AB (ref 40–160)

## 2020-08-06 LAB — CBC AND DIFFERENTIAL
HCT: 37 (ref 36–46)
Hemoglobin: 11.6 — AB (ref 12.0–16.0)

## 2020-08-06 LAB — BASIC METABOLIC PANEL
BUN: 12 (ref 4–21)
Creatinine: 0.7 (ref 0.5–1.1)
Glucose: 192

## 2020-08-08 ENCOUNTER — Ambulatory Visit: Payer: Medicare HMO | Admitting: Nurse Practitioner

## 2020-08-13 ENCOUNTER — Ambulatory Visit (INDEPENDENT_AMBULATORY_CARE_PROVIDER_SITE_OTHER): Payer: Medicare HMO | Admitting: Nurse Practitioner

## 2020-08-13 ENCOUNTER — Other Ambulatory Visit: Payer: Self-pay

## 2020-08-13 ENCOUNTER — Encounter: Payer: Self-pay | Admitting: Nurse Practitioner

## 2020-08-13 VITALS — BP 136/85 | HR 118 | Ht 64.0 in | Wt 221.0 lb

## 2020-08-13 DIAGNOSIS — E1165 Type 2 diabetes mellitus with hyperglycemia: Secondary | ICD-10-CM

## 2020-08-13 DIAGNOSIS — E782 Mixed hyperlipidemia: Secondary | ICD-10-CM | POA: Diagnosis not present

## 2020-08-13 DIAGNOSIS — E039 Hypothyroidism, unspecified: Secondary | ICD-10-CM

## 2020-08-13 DIAGNOSIS — I10 Essential (primary) hypertension: Secondary | ICD-10-CM | POA: Diagnosis not present

## 2020-08-13 MED ORDER — LEVOTHYROXINE SODIUM 112 MCG PO TABS: 112.0000 ug | ORAL_TABLET | Freq: Every day | ORAL | 3 refills | Status: DC

## 2020-08-13 MED ORDER — ACCU-CHEK GUIDE VI STRP: ORAL_STRIP | 12 refills | Status: DC

## 2020-08-13 MED ORDER — NOVOLOG MIX 70/30 FLEXPEN (70-30) 100 UNIT/ML ~~LOC~~ SUPN: 50.0000 [IU] | PEN_INJECTOR | Freq: Two times a day (BID) | SUBCUTANEOUS | 3 refills | Status: DC

## 2020-08-13 NOTE — Patient Instructions (Signed)

## 2020-08-13 NOTE — Progress Notes (Signed)
08/13/2020, 3:04 PM   Endocrinology follow-up note    Subjective:    Patient ID: Jaime Waller, female    DOB: 1945/05/01.  Jaime Waller is being seen in follow-up after she was seen in consultation for  management of currently uncontrolled symptomatic diabetes requested by  Sherrilee Gilles, DO.   Past Medical History:  Diagnosis Date   Anxiety    Asthma    Fibromyalgia    Hypertension    Hypothyroidism    Mixed hyperlipidemia    Opioid abuse ([place])    Osteopenia    Restless leg    Type II diabetes mellitus, uncontrolled ([place])    Venous insufficiency    Vitamin D deficiency      Social History   Socioeconomic History   Marital status: Married    Spouse name: Not on file   Number of children: Not on file   Years of education: Not on file   Highest education level: Not on file  Occupational History   Not on file  Tobacco Use   Smoking status: Former    Packs/day: 1.50    Pack years: 0.00    Types: Cigarettes    Quit date: 63    Years since quitting: 35.5   Smokeless tobacco: Never  Vaping Use   Vaping Use: Never used  Substance and Sexual Activity   Alcohol use: Never   Drug use: Never   Sexual activity: Not on file  Other Topics Concern   Not on file  Social History Narrative   Not on file   Social Determinants of Health   Financial Resource Strain: Not on file  Food Insecurity: Not on file  Transportation Needs: Not on file  Physical Activity: Not on file  Stress: Not on file  Social Connections: Not on file    Family History  Problem Relation Age of Onset   Diabetes Mellitus II Mother    Thyroid disease Mother    Hypertension Mother    CAD Mother    Kidney disease Mother    Heart failure Mother    Cancer Mother    Hypertension Brother    Diabetes Mellitus II Brother     Outpatient Encounter Medications as of 08/13/2020  Medication Sig   glucose blood  (ACCU-CHEK GUIDE) test strip Use as instructed to monitor glucose 4 times daily.   Use as back up method for CGM   Accu-Chek FastClix Lancets MISC Use as directed to test blood glucose two times daily   albuterol (VENTOLIN HFA) 108 (90 Base) MCG/ACT inhaler 4 (four) times daily as needed.   ALPRAZolam (XANAX) 0.5 MG tablet daily.   aspirin EC 81 MG tablet Take 81 mg by mouth daily.   B-D UF III MINI PEN NEEDLES 31G X 5 MM MISC USE 1 PEN NEEDLE 3 TIMES DAILY AS DIRECTED   benzonatate (TESSALON) 200 MG capsule daily as needed.   brimonidine (ALPHAGAN) 0.2 % ophthalmic solution    colesevelam (WELCHOL) 625 MG tablet Take by mouth.   dicyclomine (BENTYL) 10 MG capsule 10 mg 3 (three) times daily before meals.    DULoxetine (CYMBALTA) 60 MG capsule Take 60 mg by  mouth daily.    fluticasone (FLONASE) 50 MCG/ACT nasal spray 2 sprays daily.   furosemide (LASIX) 40 MG tablet daily.   gabapentin (NEURONTIN) 300 MG capsule at bedtime.   glipiZIDE (GLUCOTROL XL) 10 MG 24 hr tablet Take 1 tablet (10 mg total) by mouth daily with breakfast.   HYDROcodone-acetaminophen (NORCO/VICODIN) 5-325 MG tablet 2 (two) times daily.   insulin aspart protamine - aspart (NOVOLOG MIX 70/30 FLEXPEN) (70-30) 100 UNIT/ML FlexPen Inject 0.5 mLs (50 Units total) into the skin 2 (two) times daily with a meal.   lansoprazole (PREVACID) 30 MG capsule 30 mg 2 (two) times daily before a meal.    levocetirizine (XYZAL) 5 MG tablet Take 5 mg by mouth daily as needed.   levothyroxine (SYNTHROID) 112 MCG tablet Take 1 tablet (112 mcg total) by mouth daily before breakfast.   meloxicam (MOBIC) 7.5 MG tablet Take 7.5 mg by mouth daily.   metFORMIN (GLUCOPHAGE) 1000 MG tablet Take 1 tablet (1,000 mg total) by mouth 2 (two) times daily.   metoprolol tartrate (LOPRESSOR) 25 MG tablet Take 25 mg by mouth 2 (two) times daily with a meal.   nitrofurantoin (MACRODANTIN) 50 MG capsule Take 50 mg by mouth daily.   nystatin cream (MYCOSTATIN) 2  (two) times daily.   phenazopyridine (PYRIDIUM) 200 MG tablet Take 200 mg by mouth as needed.   pramipexole (MIRAPEX) 1 MG tablet Take 1 tablet by mouth 2 (two) times daily.    predniSONE (DELTASONE) 5 MG tablet    Semaglutide, 1 MG/DOSE, (OZEMPIC, 1 MG/DOSE,) 2 MG/1.5ML SOPN Inject 1 mg into the skin once a week.   STOOL SOFTENER 100 MG capsule 2 capsules daily.   triamcinolone cream (KENALOG) 0.1 %    triamterene-hydrochlorothiazide (DYAZIDE) 37.5-25 MG capsule Take 1 capsule by mouth daily.   Vitamin D, Ergocalciferol, (DRISDOL) 1.25 MG (50000 UT) CAPS capsule Take 50,000 Units by mouth once a week.   [DISCONTINUED] blood glucose meter kit and supplies KIT 1 each by Does not apply route 3 (three) times daily. Dispense based on patient and insurance preference. Use three times daily as directed   [DISCONTINUED] blood glucose meter kit and supplies Dispense based on patient and insurance preference. Use up to four times daily as directed. (FOR ICD-10 E10.9, E11.9).   [DISCONTINUED] insulin aspart (NOVOLOG FLEXPEN) 100 UNIT/ML FlexPen Inject 10-16 Units into the skin 3 (three) times daily with meals. (Patient not taking: Reported on 08/13/2020)   [DISCONTINUED] insulin aspart protamine - aspart (NOVOLOG MIX 70/30 FLEXPEN) (70-30) 100 UNIT/ML FlexPen Inject 0.25 mLs (25 Units total) into the skin 2 (two) times daily with a meal. (Patient taking differently: Inject 35 Units into the skin 2 (two) times daily with a meal.)   [DISCONTINUED] levothyroxine (SYNTHROID) 112 MCG tablet Take 1 tablet (112 mcg total) by mouth daily before breakfast.   [DISCONTINUED] ONETOUCH ULTRA test strip USE 1 STRIP TO CHECK GLUCOSE THREE TIMES DAILY BEFORE  MEALS (Patient not taking: Reported on 08/13/2020)   No facility-administered encounter medications on file as of 08/13/2020.    ALLERGIES: Allergies  Allergen Reactions   Amoxicillin    Dapagliflozin    Ezetimibe    Hydroxychloroquine    Lisinopril Swelling    Metronidazole    Naproxen    Pioglitazone    Plaquenil  [Hydroxychloroquine Sulfate]    Statins     VACCINATION STATUS:  There is no immunization history on file for this patient.  Diabetes She presents for her follow-up diabetic visit. She  has type 2 diabetes mellitus. Onset time: She was diagnosed at approximate age of 75 years.  She has a previous history of gestational diabetes. Her disease course has been stable. There are no hypoglycemic associated symptoms. Pertinent negatives for hypoglycemia include no confusion, headaches, nervousness/anxiousness, pallor, seizures or tremors. Associated symptoms include fatigue, foot paresthesias, polydipsia and polyuria. Pertinent negatives for diabetes include no chest pain, no polyphagia and no weight loss. There are no hypoglycemic complications. Symptoms are stable. Diabetic complications include nephropathy and peripheral neuropathy. Risk factors for coronary artery disease include diabetes mellitus, dyslipidemia, family history, hypertension, obesity, sedentary lifestyle, post-menopausal and tobacco exposure. Current diabetic treatment includes insulin injections and oral agent (dual therapy). She is compliant with treatment most of the time. Her weight is stable. She is following a generally healthy diet. When asked about meal planning, she reported none. She has had a previous visit with a dietitian. She never participates in exercise. Her home blood glucose trend is fluctuating minimally. Her breakfast blood glucose range is generally >200 mg/dl. Her lunch blood glucose range is generally >200 mg/dl. Her dinner blood glucose range is generally >200 mg/dl. Her bedtime blood glucose range is generally >200 mg/dl. Her overall blood glucose range is >200 mg/dl. (She presents today, accompanied by her daughter, with her meter and logs showing persistent hyperglycemia both fasting and postprandially.  She was not due for A1c recheck today.  She brought in  her DME supplies (dexcom) for assistance with application.) An ACE inhibitor/angiotensin II receptor blocker is contraindicated. She sees a podiatrist.Eye exam is current.  Thyroid Problem Presents for follow-up visit. Onset time: She is saying that she was diagnosed with hypothyroidism more than 20 years ago, has been on her current dose of levothyroxine 75 mcg for more than 5 years. Symptoms include fatigue. Patient reports no anxiety, cold intolerance, constipation, depressed mood, diarrhea, heat intolerance, palpitations, tremors, weight gain or weight loss. The symptoms have been stable. Her past medical history is significant for diabetes, hyperlipidemia, neuropathy and obesity. Risk factors include family history of hypothyroidism.  Hyperlipidemia This is a chronic problem. The current episode started more than 1 year ago. The problem is controlled. Recent lipid tests were reviewed and are normal. Exacerbating diseases include diabetes, hypothyroidism and obesity. Factors aggravating her hyperlipidemia include thiazides and fatty foods. Pertinent negatives include no chest pain, myalgias or shortness of breath. She is currently on no antihyperlipidemic treatment (statin intolerant. on welchol). Compliance problems include adherence to diet and adherence to exercise.  Risk factors for coronary artery disease include diabetes mellitus, dyslipidemia, a sedentary lifestyle, post-menopausal, family history, obesity and hypertension.   Review of systems  Constitutional: steady weight,  current Body mass index is 37.93 kg/m. , + fatigue, no subjective hyperthermia, no subjective hypothermia Eyes: no blurry vision, no xerophthalmia ENT: + sore throat, no nodules palpated in throat, no dysphagia/odynophagia, no hoarseness, diagnosed with burning mouth syndrome Cardiovascular: no chest pain, no shortness of breath, no palpitations, + leg swelling Respiratory: no cough, no shortness of  breath Gastrointestinal: no nausea/vomiting/diarrhea Musculoskeletal: no muscle/joint aches, disequilibrium with falls Skin: + rashes to abdominal folds (undergoing surgical eval for panniculectomy), mouth and legs, blisters to BLE  Neurological: no tremors, no numbness, no tingling, no dizziness Psychiatric: no depression, no anxiety   Objective:    BP 136/85   Pulse (!) 118   Ht '5\' 4"'  (1.626 m)   Wt 221 lb (100.2 kg)   BMI 37.93 kg/m   Wt Readings from Last  3 Encounters:  08/13/20 221 lb (100.2 kg)  07/25/20 221 lb 12.8 oz (100.6 kg)  06/18/20 219 lb (99.3 kg)    BP Readings from Last 3 Encounters:  08/13/20 136/85  06/18/20 (!) 151/74  05/07/20 138/81     Physical Exam- Limited  Constitutional:  Body mass index is 37.93 kg/m. , not in acute distress, normal state of mind Eyes:  EOMI, no exophthalmos Neck: Supple Cardiovascular: RRR, no murmurs, rubs, or gallops, + 1+ pitting edema to BLE with blistering Respiratory: Adequate breathing efforts, no crackles, rales, rhonchi, or wheezing Musculoskeletal: no gross deformities, strength intact in all four extremities, no gross restriction of joint movements Skin: scattered fluid filled blisters noted to BLE Neurological: no tremor with outstretched hands    Recent Results (from the past 2160 hour(s))  TSH     Status: Abnormal   Collection Time: 06/12/20  3:48 PM  Result Value Ref Range   TSH 6.400 (H) 0.450 - 4.500 uIU/mL  T4, free     Status: None   Collection Time: 06/12/20  3:48 PM  Result Value Ref Range   Free T4 1.04 0.82 - 1.77 ng/dL  HgB A1c     Status: Abnormal   Collection Time: 06/18/20  2:20 PM  Result Value Ref Range   Hemoglobin A1C 10.4 (A) 4.0 - 5.6 %   HbA1c POC (<> result, manual entry)     HbA1c, POC (prediabetic range)     HbA1c, POC (controlled diabetic range)    CBC and differential     Status: Abnormal   Collection Time: 08/06/20 12:00 AM  Result Value Ref Range   Hemoglobin 11.6 (A)  12.0 - 16.0   HCT 37 36 - 46  Basic metabolic panel     Status: None   Collection Time: 08/06/20 12:00 AM  Result Value Ref Range   Glucose 192    BUN 12 4 - 21   Creatinine 0.7 0.5 - 1.1  Comprehensive metabolic panel     Status: None   Collection Time: 08/06/20 12:00 AM  Result Value Ref Range   GFR calc non Af Amer 90   Lipid panel     Status: Abnormal   Collection Time: 08/06/20 12:00 AM  Result Value Ref Range   Triglycerides 195 (A) 40 - 160   Cholesterol 202 (A) 0 - 200   LDL Cholesterol 114   Hepatic function panel     Status: Abnormal   Collection Time: 08/06/20 12:00 AM  Result Value Ref Range   ALT 34 7 - 35   AST 46 (A) 13 - 35      Assessment & Plan:   1) Uncontrolled type 2 diabetes mellitus with hyperglycemia (HCC)  - Jaime Waller has currently uncontrolled symptomatic type 2 DM since 75 years of age.   She presents today, accompanied by her daughter, with her meter and logs showing persistent hyperglycemia both fasting and postprandially.  She was not due for A1c recheck today.  She brought in her DME supplies (dexcom) for assistance with application.  She also brought in paperwork for diabetic shoes.  - Recent labs reviewed.  - I had a long discussion with her about the progressive nature of diabetes and the pathology behind its complications. -her diabetes is complicated by obesity/sedentary life, peripheral neuropathy and she remains at a high risk for more acute and chronic complications which include CAD, CVA, CKD, retinopathy, and neuropathy. These are all discussed in detail with her.  -  Nutritional counseling repeated at each appointment due to patients tendency to fall back in to old habits.  - The patient admits there is a room for improvement in their diet and drink choices. -  Suggestion is made for the patient to avoid simple carbohydrates from their diet including Cakes, Sweet Desserts / Pastries, Ice Cream, Soda (diet and regular), Sweet Tea,  Candies, Chips, Cookies, Sweet Pastries, Store Bought Juices, Alcohol in Excess of 1-2 drinks a day, Artificial Sweeteners, Coffee Creamer, and "Sugar-free" Products. This will help patient to have stable blood glucose profile and potentially avoid unintended weight gain.   - I encouraged the patient to switch to unprocessed or minimally processed complex starch and increased protein intake (animal or plant source), fruits, and vegetables.   - Patient is advised to stick to a routine mealtimes to eat 3 meals a day and avoid unnecessary snacks (to snack only to correct hypoglycemia).  - I have approached her with the following individualized plan to manage  her diabetes and patient agrees:   - she will continue to need multiple daily injections of insulin in order for her to achieve and maintain control of diabetes to target.  Cost is a major concern for her.  -For simplicity reasons, we agreed that premixed insulin is the best choice.  Based on her hyperglycemia, she is advised to increase her 70/30 insulin to 50 units BID with breakfast and supper if glucose is above 90 and she is eating.  She is also advised to continue Ozempic 1 mg SQ weekly along with her Metformin 1000 mg po twice daily with meals and Glipizide 10 mg XL daily with breakfast.    -She is encouraged to continue consistently monitoring glucose at least 3 times per day, before injecting insulin (at breakfast and supper) and before bed.  She is encouraged to call the clinic if she has readings less than 70 or greater than 300 for 3 tests in a row.  -She could greatly benefit from CGM device to help her control her diabetes.  She was able to obtain Dexcom device.  Helped her with application today (showed daughter how to apply and read information).  - she is warned not to take insulin without proper monitoring per orders.  - Patient specific target  A1c;  LDL, HDL, Triglycerides, were discussed in detail.  2) Blood Pressure  /Hypertension: Her blood pressure is controlled to target for her age and comorbidities.  She is advised to continue Lasix 40 mg po daily, Metoprolol 25 mg po twice daily, and Triamterene-HCT 37.5-25 mg po daily.    3) Lipids/Hyperlipidemia:  Her most recent lipid panel from 08/06/20 shows uncontrolled LDL of 114 and elevated triglycerides of 195 (improving).  Insurance would not cover her Repatha.  She is intolerant to statins and has Zetia listed as an allergy.  She is taking her Welchol.  She is advised to limit fried foods and butter and increase her exercise.  4)  Weight/Diet:  Her Body mass index is 37.93 kg/m.-  clearly complicating her diabetes care.  She is a candidate for modest weight loss.  I discussed with her the fact that loss of 5 - 10% of her  current body weight will have the most impact on her diabetes management.  CDE Consult will be initiated . Exercise, and detailed carbohydrates information provided  -  detailed on discharge instructions.  5) Hypothyroidism-longstanding -She does not have recent TFTs to review.  She is advised to continue her  branded Synthroid at 112 mcg po daily before breakfast.  Will recheck TFTs prior to next visit and adjust dose if necessary.      - We discussed about the correct intake of her thyroid hormone, on empty stomach at fasting, with water, separated by at least 30 minutes from breakfast and other medications,  and separated by more than 4 hours from calcium, iron, multivitamins, acid reflux medications (PPIs). -Patient is made aware of the fact that thyroid hormone replacement is needed for life, dose to be adjusted by periodic monitoring of thyroid function tests.  6) Chronic Care/Health Maintenance: -she is not on ACEI/ARB or Statin medications due to multiple allergies and is encouraged to initiate and continue to follow up with Ophthalmology, Dentist,  Podiatrist at least yearly or according to recommendations, and advised to  stay away  from smoking. I have recommended yearly flu vaccine and pneumonia vaccine at least every 5 years; moderate intensity exercise for up to 150 minutes weekly; and  sleep for at least 7 hours a day.  - she is advised to locate her PMD for primary care needs, as well as her other providers for optimal and coordinated care.      I spent 44 minutes in the care of the patient today including review of labs from [facility], Lipids, Thyroid Function, Hematology (current and previous including abstractions from other facilities); face-to-face time discussing  her blood glucose readings/logs, discussing hypoglycemia and hyperglycemia episodes and symptoms, medications doses, her options of short and long term treatment based on the latest standards of care / guidelines;  discussion about incorporating lifestyle medicine;  and documenting the encounter.    Please refer to Patient Instructions for Blood Glucose Monitoring and Insulin/Medications Dosing Guide"  in media tab for additional information. Please  also refer to " Patient Self Inventory" in the Media  tab for reviewed elements of pertinent patient history.  Jaime Waller participated in the discussions, expressed understanding, and voiced agreement with the above plans.  All questions were answered to her satisfaction. she is encouraged to contact clinic should she have any questions or concerns prior to her return visit.   Follow up plan: - Return keep regularly scheduled appt, for Diabetes F/U, Bring meter and logs.  Jaime Waller, [facility] [address] [place], [place] [address] Phone: [phone] Fax: [phone]  08/13/2020, 3:04 PM

## 2020-08-27 ENCOUNTER — Encounter: Payer: Self-pay | Admitting: Neurology

## 2020-08-28 ENCOUNTER — Other Ambulatory Visit: Payer: Self-pay | Admitting: "Endocrinology

## 2020-09-18 ENCOUNTER — Ambulatory Visit: Payer: Medicare HMO | Admitting: Nutrition

## 2020-09-18 ENCOUNTER — Ambulatory Visit: Payer: Medicare HMO | Admitting: Nurse Practitioner

## 2020-10-03 ENCOUNTER — Ambulatory Visit: Payer: Medicare HMO | Admitting: Nutrition

## 2020-10-03 ENCOUNTER — Ambulatory Visit: Payer: Medicare HMO | Admitting: Nurse Practitioner

## 2020-10-22 ENCOUNTER — Ambulatory Visit: Payer: Medicare HMO | Admitting: Nutrition

## 2020-10-22 ENCOUNTER — Ambulatory Visit: Payer: Medicare HMO | Admitting: Nurse Practitioner

## 2020-10-23 LAB — COMPREHENSIVE METABOLIC PANEL
ALT: 23 IU/L (ref 0–32)
AST: 31 IU/L (ref 0–40)
Albumin/Globulin Ratio: 1.8 (ref 1.2–2.2)
Albumin: 3.9 g/dL (ref 3.7–4.7)
Alkaline Phosphatase: 70 IU/L (ref 44–121)
BUN/Creatinine Ratio: 18 (ref 12–28)
BUN: 11 mg/dL (ref 8–27)
Bilirubin Total: <0.2 mg/dL (ref 0.0–1.2)
CO2: 26 mmol/L (ref 20–29)
Calcium: 9.2 mg/dL (ref 8.7–10.3)
Chloride: 100 mmol/L (ref 96–106)
Creatinine, Ser: 0.6 mg/dL (ref 0.57–1.00)
Globulin, Total: 2.2 g/dL (ref 1.5–4.5)
Glucose: 98 mg/dL (ref 65–99)
Potassium: 3.9 mmol/L (ref 3.5–5.2)
Sodium: 142 mmol/L (ref 134–144)
Total Protein: 6.1 g/dL (ref 6.0–8.5)
eGFR: 94 mL/min/{1.73_m2} (ref >59)

## 2020-10-23 LAB — T4, FREE: Free T4: 1.13 ng/dL (ref 0.82–1.77)

## 2020-10-23 LAB — TSH: TSH: 2.34 u[IU]/mL (ref 0.450–4.500)

## 2020-11-01 ENCOUNTER — Other Ambulatory Visit: Payer: Self-pay

## 2020-11-01 ENCOUNTER — Encounter: Payer: Self-pay | Admitting: Neurology

## 2020-11-01 ENCOUNTER — Ambulatory Visit (INDEPENDENT_AMBULATORY_CARE_PROVIDER_SITE_OTHER): Payer: Medicare HMO | Admitting: Neurology

## 2020-11-01 VITALS — BP 138/76 | HR 116 | Ht 63.0 in | Wt 219.0 lb

## 2020-11-01 DIAGNOSIS — R202 Paresthesia of skin: Secondary | ICD-10-CM | POA: Diagnosis not present

## 2020-11-01 DIAGNOSIS — R259 Unspecified abnormal involuntary movements: Secondary | ICD-10-CM | POA: Diagnosis not present

## 2020-11-01 NOTE — Progress Notes (Signed)
[facility] Neurology [facility] Clinic Note - Initial Visit   Date: 11/01/20  Jaime Waller MRN: [id number] DOB: 1946/01/26   Dear Dr. Mila Palmer:  Thank you for your kind referral of Jaime Waller for consultation of neuropathy. Although her history is well known to you, please allow [place] to reiterate it for the purpose of our medical record. The patient was accompanied to the clinic by daugher who also provides collateral information.     History of Present Illness: Jaime Waller is a 75 y.o. 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 She was established with Dr. Clent Ridges, however, her insurance is no longer in-network, so requested to transfer care.  She has neuropathy for the past several years which involving numbness from her knees down into the feet and tingling involving the fingertips.  She has chronic pain from neuropathy and fibromylagia and following by pain management.  She takes gabapentin 600mg  twice daily, Cymbalta 60mg  daily, and hydrocodone.  Her balance is poor, she walks unassisted and has suffered a few falls.  She tends to lean on furniture for assistances.  She also complains of a long history of shaking spells which date back to 1989. The spells are different, sometimes arms and hands shaking, other times her feet and legs are jerking.  It can last from minutes to hours.  No loss of consciousness. Sometimes, the spells can be painful and she is very uncomfortable. No specific triggers, such as stress or anxiety.  She is unable to control the movements.  Daughter has a video of her sitting with legs moving at the ankles up and down and then side-to-side. Patient awake and looks in mild distress, rubbing her legs.   Out-side paper records, electronic medical record, and images have been reviewed  where available and summarized as:  Lab Results  Component Value Date   HGBA1C 10.4 (A) 06/18/2020   No results found for: VITAMINB12 Lab Results  Component Value Date   TSH 2.340 10/22/2020    Past Medical History:  Diagnosis Date   Anxiety    Asthma    Fibromyalgia    Hypertension    Hypothyroidism    Mixed hyperlipidemia    Opioid abuse (HCC)    Osteopenia    Restless leg    Type II diabetes mellitus, uncontrolled (HCC)    Venous insufficiency    Vitamin D deficiency     Past Surgical History:  Procedure Laterality Date   ABDOMINAL HYSTERECTOMY     CARPAL TUNNEL RELEASE     R Hand   OTHER SURGICAL HISTORY     Right Foot and Ankle Surgery/fx   TONSILLECTOMY       Medications:  Outpatient Encounter Medications as of 11/01/2020  Medication Sig   Accu-Chek FastClix Lancets MISC Use as directed to test blood glucose two times daily   albuterol (VENTOLIN HFA) 108 (90 Base) MCG/ACT inhaler 4 (four) times daily as needed.   ALPRAZolam (XANAX) 0.5 MG tablet daily.   aspirin EC 81 MG tablet Take 81 mg by mouth daily.   B-D UF III MINI PEN NEEDLES 31G X 5 MM MISC USE 1 PEN NEEDLE 3 TIMES DAILY AS DIRECTED   benzonatate (TESSALON) 200 MG capsule daily as needed.   brimonidine (ALPHAGAN) 0.2 % ophthalmic solution    colesevelam (WELCHOL) 625 MG tablet Take by mouth.   dicyclomine (BENTYL) 10 MG capsule 10 mg  3 (three) times daily before meals.    DULoxetine (CYMBALTA) 60 MG capsule Take 60 mg by mouth daily.    fluticasone (FLONASE) 50 MCG/ACT nasal spray 2 sprays daily.   furosemide (LASIX) 40 MG tablet daily.   gabapentin (NEURONTIN) 300 MG capsule at bedtime.   glipiZIDE (GLUCOTROL XL) 10 MG 24 hr tablet Take 1 tablet (10 mg total) by mouth daily with breakfast.   glucose blood (ACCU-CHEK GUIDE) test strip Use as instructed to monitor glucose 4 times daily.   Use as back up method for CGM   HYDROcodone-acetaminophen (NORCO/VICODIN) 5-325 MG tablet 2 (two) times daily.    insulin aspart protamine - aspart (NOVOLOG MIX 70/30 FLEXPEN) (70-30) 100 UNIT/ML FlexPen Inject 0.5 mLs (50 Units total) into the skin 2 (two) times daily with a meal.   lansoprazole (PREVACID) 30 MG capsule 30 mg 2 (two) times daily before a meal.    levocetirizine (XYZAL) 5 MG tablet Take 5 mg by mouth daily as needed.   levothyroxine (SYNTHROID) 112 MCG tablet Take 1 tablet (112 mcg total) by mouth daily before breakfast.   meloxicam (MOBIC) 7.5 MG tablet Take 7.5 mg by mouth daily.   metFORMIN (GLUCOPHAGE) 1000 MG tablet Take 1 tablet by mouth twice daily   metoprolol tartrate (LOPRESSOR) 25 MG tablet Take 25 mg by mouth 2 (two) times daily with a meal.   nitrofurantoin (MACRODANTIN) 50 MG capsule Take 50 mg by mouth daily.   nystatin cream (MYCOSTATIN) 2 (two) times daily.   phenazopyridine (PYRIDIUM) 200 MG tablet Take 200 mg by mouth as needed.   pramipexole (MIRAPEX) 1 MG tablet Take 1 tablet by mouth 2 (two) times daily.    predniSONE (DELTASONE) 5 MG tablet    Semaglutide, 1 MG/DOSE, (OZEMPIC, 1 MG/DOSE,) 2 MG/1.5ML SOPN Inject 1 mg into the skin once a week.   STOOL SOFTENER 100 MG capsule 2 capsules daily.   triamcinolone cream (KENALOG) 0.1 %    triamterene-hydrochlorothiazide (DYAZIDE) 37.5-25 MG capsule Take 1 capsule by mouth daily.   Vitamin D, Ergocalciferol, (DRISDOL) 1.25 MG (50000 UT) CAPS capsule Take 50,000 Units by mouth once a week.   No facility-administered encounter medications on file as of 11/01/2020.    Allergies:  Allergies  Allergen Reactions   Amoxicillin    Dapagliflozin    Ezetimibe    Hydroxychloroquine    Lisinopril Swelling   Metronidazole    Naproxen    Pioglitazone    Plaquenil  [Hydroxychloroquine Sulfate]    Statins     Family History: Family History  Problem Relation Age of Onset   Cancer Mother        Bladder Cancer & Breast Cancer   Diabetes Mellitus II Mother    Thyroid disease Mother    Hypertension Mother    CAD Mother     Kidney disease Mother    Heart failure Mother    Cancer Brother        Lung Cancer   Hypertension Brother    Diabetes Mellitus II Brother     Social History: Social History   Tobacco Use   Smoking status: Former    Packs/day: 1.50    Types: Cigarettes    Quit date: 1987    Years since quitting: 35.7   Smokeless tobacco: Never  Vaping Use   Vaping Use: Never used  Substance Use Topics   Alcohol use: Never   Drug use: Never   Social History   Social History Narrative   Right  Handed    Lives in a one story     Vital Signs:  BP 138/76   Pulse (!) 116   Ht 5\' 3"  (1.6 m)   Wt 219 lb (99.3 kg)   SpO2 96%   BMI 38.79 kg/m     Neurological Exam: MENTAL STATUS including orientation to time, place, person, recent and remote memory, attention span and concentration, language, and fund of knowledge is normal.  Speech is not dysarthric.  CRANIAL NERVES: II:  No visual field defects.   III-IV-VI: Pupils equal round and reactive to light.  Normal conjugate, extra-ocular eye movements in all directions of gaze.  No nystagmus.  No ptosis.   V:  Normal facial sensation.    VII:  Normal facial symmetry and movements.   VIII:  Normal hearing and vestibular function.   IX-X:  Normal palatal movement.   XI:  Normal shoulder shrug and head rotation.   XII:  Normal tongue strength and range of motion, no deviation or fasciculation.  MOTOR:  No atrophy, fasciculations or abnormal movements.  No pronator drift.   Upper Extremity:  Right  Left  Deltoid  5/5   5/5   Biceps  5/5   5/5   Triceps  5/5   5/5   Infraspinatus 5/5  5/5  Medial pectoralis 5/5  5/5  Wrist extensors  5/5   5/5   Wrist flexors  5/5   5/5   Finger extensors  5/5   5/5   Finger flexors  5/5   5/5   Dorsal interossei  5/5   5/5   Abductor pollicis  5/5   5/5   Tone (Ashworth scale)  0  0   Lower Extremity:  Right  Left  Hip flexors  5/5   5/5   Hip extensors  5/5   5/5   Adductor 5/5  5/5  Abductor  5/5  5/5  Knee flexors  5/5   5/5   Knee extensors  5/5   5/5   Dorsiflexors  5/5   5/5   Plantarflexors  5/5   5/5   Toe extensors  5/5   5/5   Toe flexors  5/5   5/5   Tone (Ashworth scale)  0  0   MSRs:  Right        Left                  brachioradialis 2+  2+  biceps 2+  2+  triceps 2+  2+  patellar 2+  2+  ankle jerk 0  0  Hoffman no  no  plantar response down  down   SENSORY:  Vibration, temperature, and pin prick reduced in the feet and lower legs.  Sensation in the hands intact. Rhomberg sign is present.  COORDINATION/GAIT: Normal finger-to- nose-finger.  Intact rapid alternating movements bilaterally.  Very unsteady gait, unassisted, with irregular pause.   IMPRESSION: Diabetic neuropathy manifesting with painful paresthesias of the legs and gait ataxia  - She is followed by pain management for pain control  - For imbalance, I strongly recommend she use gait assistance such as a walker  - Start physical therapy for gait training  - Patient educated on daily foot inspection, fall prevention, and safety precautions around the home.  2.   Paroxysmal movements of the arms and legs, longstanding.  - MRI brain recently ordered by PCP, report has been requested  - Spells are not typical for seizures, will obtain EEG  to be complete  - Consider referral to movement disorder (?functional movements)  Return to clinic in 6 months.    Thank you for allowing me to participate in patient's care.  If I can answer any additional questions, I would be pleased to do so.    Sincerely,    Akilah Cureton K. Allena Katz, DO

## 2020-11-01 NOTE — Patient Instructions (Addendum)
Start physical therapy for gait training  We will request MRI brain from your PCP  Routine EEG  Return to clinic in 6 months

## 2020-11-04 ENCOUNTER — Ambulatory Visit (INDEPENDENT_AMBULATORY_CARE_PROVIDER_SITE_OTHER): Payer: Medicare HMO | Admitting: Nurse Practitioner

## 2020-11-04 ENCOUNTER — Encounter: Payer: Self-pay | Admitting: Nutrition

## 2020-11-04 ENCOUNTER — Other Ambulatory Visit: Payer: Self-pay

## 2020-11-04 ENCOUNTER — Encounter: Payer: Medicare HMO | Attending: Nurse Practitioner | Admitting: Nutrition

## 2020-11-04 ENCOUNTER — Encounter: Payer: Self-pay | Admitting: Nurse Practitioner

## 2020-11-04 VITALS — BP 126/77 | HR 125 | Ht 63.0 in | Wt 222.6 lb

## 2020-11-04 VITALS — Ht 63.0 in | Wt 222.0 lb

## 2020-11-04 DIAGNOSIS — E1165 Type 2 diabetes mellitus with hyperglycemia: Secondary | ICD-10-CM

## 2020-11-04 DIAGNOSIS — E669 Obesity, unspecified: Secondary | ICD-10-CM | POA: Diagnosis present

## 2020-11-04 DIAGNOSIS — E039 Hypothyroidism, unspecified: Secondary | ICD-10-CM

## 2020-11-04 DIAGNOSIS — I10 Essential (primary) hypertension: Secondary | ICD-10-CM | POA: Diagnosis present

## 2020-11-04 DIAGNOSIS — E782 Mixed hyperlipidemia: Secondary | ICD-10-CM | POA: Diagnosis present

## 2020-11-04 LAB — POCT GLYCOSYLATED HEMOGLOBIN (HGB A1C): Hemoglobin A1C: 8.2 % — AB (ref 4.0–5.6)

## 2020-11-04 MED ORDER — NOVOLOG MIX 70/30 FLEXPEN (70-30) 100 UNIT/ML ~~LOC~~ SUPN: 60.0000 [IU] | PEN_INJECTOR | Freq: Two times a day (BID) | SUBCUTANEOUS | 3 refills | Status: DC

## 2020-11-04 NOTE — Progress Notes (Signed)
11/04/2020, 2:45 PM   Endocrinology follow-up note    Subjective:    Patient ID: Jaime Waller, female    DOB: Jun 17, 1945.  Jaime Waller is being seen in follow-up after she was seen in consultation for  management of currently uncontrolled symptomatic diabetes requested by  Sherrilee Gilles, DO.   Past Medical History:  Diagnosis Date   Anxiety    Asthma    Fibromyalgia    Hypertension    Hypothyroidism    Mixed hyperlipidemia    Opioid abuse ([place])    Osteopenia    Restless leg    Type II diabetes mellitus, uncontrolled ([place])    Venous insufficiency    Vitamin D deficiency      Social History   Socioeconomic History   Marital status: Married    Spouse name: Not on file   Number of children: 2   Years of education: Not on file   Highest education level: Not on file  Occupational History   Not on file  Tobacco Use   Smoking status: Former    Packs/day: 1.50    Types: Cigarettes    Quit date: 32    Years since quitting: 35.7   Smokeless tobacco: Never  Vaping Use   Vaping Use: Never used  Substance and Sexual Activity   Alcohol use: Never   Drug use: Never   Sexual activity: Not on file  Other Topics Concern   Not on file  Social History Narrative   Right Handed    Lives in a one story    Social Determinants of Health   Financial Resource Strain: Not on file  Food Insecurity: Not on file  Transportation Needs: Not on file  Physical Activity: Not on file  Stress: Not on file  Social Connections: Not on file    Family History  Problem Relation Age of Onset   Cancer Mother        Bladder Cancer & Breast Cancer   Diabetes Mellitus II Mother    Thyroid disease Mother    Hypertension Mother    CAD Mother    Kidney disease Mother    Heart failure Mother    Cancer Brother        Lung Cancer   Hypertension Brother    Diabetes Mellitus II Brother     Outpatient  Encounter Medications as of 11/04/2020  Medication Sig   Accu-Chek FastClix Lancets MISC Use as directed to test blood glucose two times daily   albuterol (VENTOLIN HFA) 108 (90 Base) MCG/ACT inhaler 4 (four) times daily as needed.   ALPRAZolam (XANAX) 0.5 MG tablet daily.   aspirin EC 81 MG tablet Take 81 mg by mouth daily.   azelastine (ASTELIN) 0.1 % nasal spray azelastine 137 mcg (0.1 %) nasal spray aerosol   B-D UF III MINI PEN NEEDLES 31G X 5 MM MISC USE 1 PEN NEEDLE 3 TIMES DAILY AS DIRECTED   benzonatate (TESSALON) 200 MG capsule daily as needed.   betamethasone dipropionate 0.05 % cream betamethasone dipropionate 0.05 % topical cream  APPLY CREAM TOPICALLY TO AFFECTED AREAS ON LOWER LEG TWICE DAILY FOR 14 DAYS AND THEN USE ONCE  A DAY FOR FLARES   brimonidine (ALPHAGAN) 0.2 % ophthalmic solution    Cholecalciferol 1.25 MG (50000 UT) capsule cholecalciferol (vitamin D3) 1,250 mcg (50,000 unit) capsule  TAKE 1 CAPSULE BY MOUTH ONCE A WEEK   colesevelam (WELCHOL) 625 MG tablet Take by mouth.   dicyclomine (BENTYL) 10 MG capsule 10 mg 3 (three) times daily before meals.    DULoxetine (CYMBALTA) 30 MG capsule Take by mouth.   DULoxetine (CYMBALTA) 60 MG capsule Take 60 mg by mouth daily.    DULoxetine (CYMBALTA) 60 MG capsule Take by mouth.   famotidine (PEPCID) 20 MG tablet famotidine 20 mg tablet  TAKE 1 TABLET BY MOUTH TWICE DAILY   fluconazole (DIFLUCAN) 150 MG tablet fluconazole 150 mg tablet  TAKE 1 TABLET BY MOUTH ONCE DAILY   fluticasone (FLONASE) 50 MCG/ACT nasal spray 2 sprays daily.   furosemide (LASIX) 20 MG tablet furosemide 20 mg tablet   furosemide (LASIX) 40 MG tablet daily.   gabapentin (NEURONTIN) 300 MG capsule at bedtime.   glipiZIDE (GLUCOTROL XL) 10 MG 24 hr tablet Take 1 tablet (10 mg total) by mouth daily with breakfast.   glucose blood (ACCU-CHEK GUIDE) test strip Use as instructed to monitor glucose 4 times daily.   Use as back up method for CGM    HYDROcodone-acetaminophen (NORCO/VICODIN) 5-325 MG tablet 2 (two) times daily.   ketoconazole (NIZORAL) 2 % cream ketoconazole 2 % topical cream  APPLY TO AFFECTED AREAS ON SKIN FOLDS TWICE DAILY FOR 2 WEEKS, REPEAT AS NEEDED FOR FLARES   lansoprazole (PREVACID) 30 MG capsule 30 mg 2 (two) times daily before a meal.    lansoprazole (PREVACID) 30 MG capsule Take by mouth.   levocetirizine (XYZAL) 5 MG tablet Take 5 mg by mouth daily as needed.   levothyroxine (SYNTHROID) 112 MCG tablet Take 1 tablet (112 mcg total) by mouth daily before breakfast.   meloxicam (MOBIC) 7.5 MG tablet Take 7.5 mg by mouth daily.   metFORMIN (GLUCOPHAGE) 1000 MG tablet Take 1 tablet by mouth twice daily   methocarbamol (ROBAXIN) 500 MG tablet methocarbamol 500 mg tablet  Take 1 tablet 4 times a day by oral route.   metoCLOPramide (REGLAN) 5 MG tablet metoclopramide 5 mg tablet  TAKE 1 TABLET BY MOUTH BEFORE MEAL(S) AND AT BEDTIME   metoprolol tartrate (LOPRESSOR) 25 MG tablet Take 25 mg by mouth 2 (two) times daily with a meal.   mupirocin ointment (BACTROBAN) 2 % mupirocin 2 % topical ointment  APPLY OINTMENT TOPICALLY TO AFFECTED AREA TWICE DAILY 1 WEEK   nitrofurantoin (MACRODANTIN) 50 MG capsule Take 50 mg by mouth daily.   nystatin cream (MYCOSTATIN) 2 (two) times daily.   PERCOCET 5-325 MG tablet 1 tablet PO Q 6 H PRN  - Pain   phenazopyridine (PYRIDIUM) 200 MG tablet Take 200 mg by mouth as needed.   polyethylene glycol powder (GLYCOLAX/MIRALAX) 17 GM/SCOOP powder SMARTSIG:2 Tablespoon By Mouth Daily   pramipexole (MIRAPEX) 1 MG tablet Take 1 tablet by mouth 2 (two) times daily.    predniSONE (DELTASONE) 5 MG tablet    Semaglutide, 1 MG/DOSE, (OZEMPIC, 1 MG/DOSE,) 2 MG/1.5ML SOPN Inject 1 mg into the skin once a week.   silver sulfADIAZINE (SILVADENE) 1 % cream SSD 1 % topical cream  APPLY CREAM TO AFFECTED AREAS TWICE DAILY   STOOL SOFTENER 100 MG capsule 2 capsules daily.   tamsulosin (FLOMAX) 0.4 MG  CAPS capsule tamsulosin 0.4 mg capsule  TAKE 1 CAPSULE BY MOUTH 30  MINUTES AFTER THE LAST MEAL OF THE DAY   triamcinolone cream (KENALOG) 0.1 %    triamterene-hydrochlorothiazide (DYAZIDE) 37.5-25 MG capsule Take 1 capsule by mouth daily.   Vitamin D, Ergocalciferol, (DRISDOL) 1.25 MG (50000 UT) CAPS capsule Take 50,000 Units by mouth once a week.   [DISCONTINUED] insulin aspart protamine - aspart (NOVOLOG MIX 70/30 FLEXPEN) (70-30) 100 UNIT/ML FlexPen Inject 0.5 mLs (50 Units total) into the skin 2 (two) times daily with a meal.   insulin aspart protamine - aspart (NOVOLOG MIX 70/30 FLEXPEN) (70-30) 100 UNIT/ML FlexPen Inject 60 Units into the skin 2 (two) times daily with a meal.   No facility-administered encounter medications on file as of 11/04/2020.    ALLERGIES: Allergies  Allergen Reactions   Lisinopril Swelling   Amoxicillin    Dapagliflozin    Ezetimibe    Hydroxychloroquine    Metronidazole    Naproxen    Pioglitazone    Plaquenil  [Hydroxychloroquine Sulfate]    Statins    Wound Dressings     VACCINATION STATUS:  There is no immunization history on file for this patient.  Diabetes She presents for her follow-up diabetic visit. She has type 2 diabetes mellitus. Onset time: She was diagnosed at approximate age of 30 years.  She has a previous history of gestational diabetes. Her disease course has been improving. There are no hypoglycemic associated symptoms. Pertinent negatives for hypoglycemia include no confusion, headaches, nervousness/anxiousness, pallor, seizures or tremors. Associated symptoms include fatigue, foot paresthesias and polyuria. Pertinent negatives for diabetes include no chest pain, no polydipsia, no polyphagia and no weight loss. There are no hypoglycemic complications. Symptoms are improving. Diabetic complications include peripheral neuropathy. Risk factors for coronary artery disease include diabetes mellitus, dyslipidemia, family history,  hypertension, obesity, sedentary lifestyle, post-menopausal and tobacco exposure. Current diabetic treatment includes insulin injections and oral agent (dual therapy). She is compliant with treatment most of the time. Her weight is fluctuating minimally. She is following a generally healthy diet. When asked about meal planning, she reported none. She has had a previous visit with a dietitian. She never participates in exercise. Her home blood glucose trend is decreasing steadily. Her breakfast blood glucose range is generally 180-200 mg/dl. Her lunch blood glucose range is generally 180-200 mg/dl. Her dinner blood glucose range is generally 180-200 mg/dl. Her bedtime blood glucose range is generally >200 mg/dl. Her overall blood glucose range is 180-200 mg/dl. (She presents today, accompanied by her daughter, with her CGM showing improving glycemic profile, yet still above target.  Her POCT A1c today is 8.2%, improving from last visit of 10.4%.  She denies any hypoglycemia.  Analysis of her CGM shows TIR 26%, TAR 74%, TBR 0%.) An ACE inhibitor/angiotensin II receptor blocker is contraindicated. She sees a podiatrist.Eye exam is current.  Thyroid Problem Presents for follow-up visit. Onset time: She is saying that she was diagnosed with hypothyroidism more than 20 years ago, has been on her current dose of levothyroxine 75 mcg for more than 5 years. Symptoms include fatigue and hair loss. Patient reports no anxiety, cold intolerance, constipation, depressed mood, diarrhea, heat intolerance, palpitations, tremors, weight gain or weight loss. (He brought in bags of hair which she has lost on any given day.  Her daughter also had to cut mats out of her hair.  She was wondering if this hair loss was related to her thyroid levels.) The symptoms have been stable. Her past medical history is significant for diabetes, hyperlipidemia, neuropathy and obesity. Risk  factors include family history of hypothyroidism.   Hyperlipidemia This is a chronic problem. The current episode started more than 1 year ago. The problem is controlled. Recent lipid tests were reviewed and are normal. Exacerbating diseases include diabetes, hypothyroidism and obesity. Factors aggravating her hyperlipidemia include thiazides and fatty foods. Pertinent negatives include no chest pain, myalgias or shortness of breath. She is currently on no antihyperlipidemic treatment (statin intolerant. on welchol). Compliance problems include adherence to diet and adherence to exercise.  Risk factors for coronary artery disease include diabetes mellitus, dyslipidemia, a sedentary lifestyle, post-menopausal, family history, obesity and hypertension.   Review of systems  Constitutional: steady weight,  current Body mass index is 39.43 kg/m. , + fatigue, no subjective hyperthermia, no subjective hypothermia Eyes: no blurry vision, no xerophthalmia ENT: + sore throat, no nodules palpated in throat, no dysphagia/odynophagia, no hoarseness, diagnosed with burning mouth syndrome Cardiovascular: no chest pain, no shortness of breath, no palpitations, + leg swelling Respiratory: no cough, no shortness of breath Gastrointestinal: no nausea/vomiting/diarrhea Musculoskeletal: no muscle/joint aches, disequilibrium with falls-seeing new neurologist in [place]- will start PT soon Skin: + rashes to abdominal folds (undergoing surgical eval for panniculectomy), mouth and legs, blisters to BLE  Neurological: no tremors, no numbness, no tingling, no dizziness Psychiatric: no depression, no anxiety   Objective:    BP 126/77   Pulse (!) 125   Ht '5\' 3"'  (1.6 m)   Wt 222 lb 9.6 oz (101 kg)   BMI 39.43 kg/m   Wt Readings from Last 3 Encounters:  11/04/20 222 lb 9.6 oz (101 kg)  11/01/20 219 lb (99.3 kg)  08/13/20 221 lb (100.2 kg)    BP Readings from Last 3 Encounters:  11/04/20 126/77  11/01/20 138/76  08/13/20 136/85     Physical Exam-  Limited  Constitutional:  Body mass index is 39.43 kg/m. , not in acute distress, normal state of mind Eyes:  EOMI, no exophthalmos Neck: Supple Cardiovascular: RRR, no murmurs, rubs, or gallops, + 1+ pitting edema to BLE with blistering Respiratory: Adequate breathing efforts, no crackles, rales, rhonchi, or wheezing Musculoskeletal: no gross deformities, strength intact in all four extremities, no gross restriction of joint movements Skin: scattered fluid filled blisters noted to BLE Neurological: no tremor with outstretched hands    Recent Results (from the past 2160 hour(s))  Comprehensive metabolic panel     Status: None   Collection Time: 10/22/20  3:54 PM  Result Value Ref Range   Glucose 98 65 - 99 mg/dL   BUN 11 8 - 27 mg/dL   Creatinine, Ser 0.60 0.57 - 1.00 mg/dL   eGFR 94 >59 mL/min/1.73   BUN/Creatinine Ratio 18 12 - 28   Sodium 142 134 - 144 mmol/L   Potassium 3.9 3.5 - 5.2 mmol/L   Chloride 100 96 - 106 mmol/L   CO2 26 20 - 29 mmol/L   Calcium 9.2 8.7 - 10.3 mg/dL   Total Protein 6.1 6.0 - 8.5 g/dL   Albumin 3.9 3.7 - 4.7 g/dL   Globulin, Total 2.2 1.5 - 4.5 g/dL   Albumin/Globulin Ratio 1.8 1.2 - 2.2   Bilirubin Total <0.2 0.0 - 1.2 mg/dL   Alkaline Phosphatase 70 44 - 121 IU/L   AST 31 0 - 40 IU/L   ALT 23 0 - 32 IU/L  TSH     Status: None   Collection Time: 10/22/20  3:54 PM  Result Value Ref Range   TSH 2.340 0.450 - 4.500 uIU/mL  T4, free     Status: None   Collection Time: 10/22/20  3:54 PM  Result Value Ref Range   Free T4 1.13 0.82 - 1.77 ng/dL  POCT glycosylated hemoglobin (Hb A1C)     Status: Abnormal   Collection Time: 11/04/20  2:30 PM  Result Value Ref Range   Hemoglobin A1C 8.2 (A) 4.0 - 5.6 %   HbA1c POC (<> result, manual entry)     HbA1c, POC (prediabetic range)     HbA1c, POC (controlled diabetic range)        Assessment & Plan:   1) Uncontrolled type 2 diabetes mellitus with hyperglycemia (HCC)  - Jaime Waller has currently  uncontrolled symptomatic type 2 DM since 75 years of age.   She presents today, accompanied by her daughter, with her CGM showing improving glycemic profile, yet still above target.  Her POCT A1c today is 8.2%, improving from last visit of 10.4%.  She denies any hypoglycemia.  Analysis of her CGM shows TIR 26%, TAR 74%, TBR 0%.  - Recent labs reviewed.  - I had a long discussion with her about the progressive nature of diabetes and the pathology behind its complications. -her diabetes is complicated by obesity/sedentary life, peripheral neuropathy and she remains at a high risk for more acute and chronic complications which include CAD, CVA, CKD, retinopathy, and neuropathy. These are all discussed in detail with her.  - Nutritional counseling repeated at each appointment due to patients tendency to fall back in to old habits.  - The patient admits there is a room for improvement in their diet and drink choices. -  Suggestion is made for the patient to avoid simple carbohydrates from their diet including Cakes, Sweet Desserts / Pastries, Ice Cream, Soda (diet and regular), Sweet Tea, Candies, Chips, Cookies, Sweet Pastries, Store Bought Juices, Alcohol in Excess of 1-2 drinks a day, Artificial Sweeteners, Coffee Creamer, and "Sugar-free" Products. This will help patient to have stable blood glucose profile and potentially avoid unintended weight gain.   - I encouraged the patient to switch to unprocessed or minimally processed complex starch and increased protein intake (animal or plant source), fruits, and vegetables.   - Patient is advised to stick to a routine mealtimes to eat 3 meals a day and avoid unnecessary snacks (to snack only to correct hypoglycemia).  - I have approached her with the following individualized plan to manage  her diabetes and patient agrees:   - she will continue to need multiple daily injections of insulin in order for her to achieve and maintain control of diabetes to  target.  Cost is a major concern for her.  -For simplicity reasons, we agreed that premixed insulin is the best choice.    Based on her hyperglycemia, she is advised to increase her 70/30 insulin to 60 units BID with breakfast and supper if glucose is above 90 and she is eating.  She is also advised to continue Ozempic 1 mg SQ weekly along with her Metformin 1000 mg po twice daily with meals and Glipizide 10 mg XL daily with breakfast.    -She is encouraged to continue consistently monitoring glucose at least 3 times per day, before injecting insulin (at breakfast and supper) and before bed using her CGM device.  She is encouraged to call the clinic if she has readings less than 70 or greater than 300 for 3 tests in a row.  - she is warned not to take insulin without proper monitoring per  orders.  - Patient specific target  A1c;  LDL, HDL, Triglycerides, were discussed in detail.  2) Blood Pressure /Hypertension: Her blood pressure is controlled to target.  She is advised to continue Lasix 40 mg po daily, Metoprolol 25 mg po twice daily, and Triamterene-HCT 37.5-25 mg po daily.    3) Lipids/Hyperlipidemia:  Her most recent lipid panel from 08/06/20 shows uncontrolled LDL of 114 and elevated triglycerides of 195 (improving).  Insurance would not cover her Repatha.  She is intolerant to statins and has Zetia listed as an allergy.  She is taking her Welchol.  She is advised to limit fried foods and butter and increase her exercise.  4)  Weight/Diet:  Her Body mass index is 39.43 kg/m.-  clearly complicating her diabetes care.  She is a candidate for modest weight loss.  I discussed with her the fact that loss of 5 - 10% of her  current body weight will have the most impact on her diabetes management.  CDE Consult will be initiated . Exercise, and detailed carbohydrates information provided  -  detailed on discharge instructions.  5) Hypothyroidism-longstanding -Her previsit thyroid function tests  are consistent with appropriate hormone replacement.  She is advised to continue her current dose of Synthroid (branded) at 112 mcg po daily before breakfast.       - We discussed about the correct intake of her thyroid hormone, on empty stomach at fasting, with water, separated by at least 30 minutes from breakfast and other medications,  and separated by more than 4 hours from calcium, iron, multivitamins, acid reflux medications (PPIs). -Patient is made aware of the fact that thyroid hormone replacement is needed for life, dose to be adjusted by periodic monitoring of thyroid function tests.  6) Chronic Care/Health Maintenance: -she is not on ACEI/ARB or Statin medications due to multiple allergies and is encouraged to initiate and continue to follow up with Ophthalmology, Dentist, Podiatrist at least yearly or according to recommendations, and advised to  stay away from smoking. I have recommended yearly flu vaccine and pneumonia vaccine at least every 5 years; moderate intensity exercise for up to 150 minutes weekly; and  sleep for at least 7 hours a day.  - she is advised to locate her PMD for primary care needs, as well as her other providers for optimal and coordinated care.        I spent 45 minutes in the care of the patient today including review of labs from [facility], Lipids, Thyroid Function, Hematology (current and previous including abstractions from other facilities); face-to-face time discussing  her blood glucose readings/logs, discussing hypoglycemia and hyperglycemia episodes and symptoms, medications doses, her options of short and long term treatment based on the latest standards of care / guidelines;  discussion about incorporating lifestyle medicine;  and documenting the encounter.    Please refer to Patient Instructions for Blood Glucose Monitoring and Insulin/Medications Dosing Guide"  in media tab for additional information. Please  also refer to " Patient Self Inventory" in  the Media  tab for reviewed elements of pertinent patient history.  Jaime Waller participated in the discussions, expressed understanding, and voiced agreement with the above plans.  All questions were answered to her satisfaction. she is encouraged to contact clinic should she have any questions or concerns prior to her return visit.   Follow up plan: - Return in about 3 months (around 02/03/2021) for Diabetes F/U with A1c in office, No previsit labs, Bring meter and logs.  Rayetta Pigg, [facility] [address] [place], [place] [address] Phone: [phone] Fax: [phone]  11/04/2020, 2:45 PM

## 2020-11-04 NOTE — Progress Notes (Signed)
Medical Nutrition Therapy:  Appt start time: 1430  end time: 1500 Assessment:  Primary concerns today: Diabetes Type 2, Obesity.  Saw Ronny Bacon, FNP today at [facility]. Has oral trush and bladder infection. On medications. Here with her daughter.  Was taking 50 units of 70/30 twice a day and is now increased to 60 units a day Has a poor appetite for real food but eats snacks all night long contributing to elevated blood sugars. Uses Dexcom. Reset her alarms to alarm elevated blood sugars over 200 instead of 280 mg/dl. Low alarm is set at 70 mg/dl. She has not had any low blood sugars less than 100 Dexcom shows her TIR is only 26%. Highest blood sugars are between 6 pm and 6 am. She notes she doesn't sleep well and can't use her CPAP because of her mouth ulcers and thrush. She notes she stays up and snacks all night long and then sleeps a lot during the day. Has increased urination, dry mouth, increased thirst but only drinks diet sodas, juice or tea. Very little water because she doesn't like it. Recent bladder infection noted.  She doensn't like to drink water. Suggested use of a straw to consume water to increase hydration and improve blood sugars and benefits of medications.  Diet is insuffient to meet her nutritional needs and contributing to her elevated blood sugars and most likely contributing to her yeast infections.   She is here with her daughter. Had a kidney stone a few months ago. Swallowing is about the same. EDG showed some deterioration of her tissue in the esophagus. Has  gastroparesis and stomach ulcer. 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 Still has rash/cellulitis on her lower legs. Her mouth continues to burn and has found that eating an apple slice is soothing to her mouth. 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    Lab Results  Component Value Date   HGBA1C 8.2 (A) 11/04/2020    Glipizide, Metformin 1000 mg  BID, Ozempic weekly  Preferred Learning Style: Auditory Visual Hands on  Learning Readiness:  Ready Change in progress   MEDICATIONS:   DIETARY INTAKE:    24-hr recall:  Doesn't eat much during the day since she is usually sleeping and stays up and snacks most of the night.  Beverages: coffee, sodas, diet sodas, tea Usual physical activity:   Estimated energy needs: 1200  calories 133g carbohydrates 90 g protein 33 g fat  Progress Towards Goal(s):  In progress.   Nutritional Diagnosis:  NB-1.1 Food and nutrition-related knowledge deficit As related to DIabetes Type 2.  As evidenced by A1c 7.9%.    Intervention:  Nutrition and Diabetes education provided on My Plate, CHO counting, meal planning, portion sizes, timing of meals, avoiding snacks between meals unless having a low blood sugar, target ranges for A1C and blood sugars, signs/symptoms and treatment of hyper/hypoglycemia, monitoring blood sugars, taking medications as prescribed, benefits of exercising 30 minutes per day and prevention of complications of DM.   GOals  Drink 5 bottles of water per day Get straws to use for liquid Cut out eating at night and dont' eat after 7pm. Take insulin 70/30 60 units twice a day Eat  1-2 containers of Activita daily. Talk to pharmacist about a pro/prebiotic Talk to MD about walker Use Dexcom to learns about blood sugars. Get A1C down to 8% in 3 months. Follow Plant based meal pattern   .  Teaching Method Utilized:  Visual Auditory Hands on  Handouts given during visit include: The Plate Method  Meal Plan Card  Diabetes instrucitons.   Barriers to learning/adherence to lifestyle change: non3  Demonstrated degree of understanding via:  Teach Back   Monitoring/Evaluation:  Dietary intake, exercise, , and body weight in  3 month(s).

## 2020-11-04 NOTE — Patient Instructions (Signed)

## 2020-11-04 NOTE — Patient Instructions (Addendum)
Goals  Drink 5 bottles of water per day Get straws to use for liquid Cut out eating at night and dont' eat after 7pm. Take insulin 70/30 60 units twice a day Eat  1-2 containers of Activita daily. Talk to pharmacist about a pro/prebiotic Talk to MD about walker Use Dexcom to learns about blood sugars. Get A1C down to 8% in 3 months. Follow Plant based meal pattern

## 2020-11-05 ENCOUNTER — Encounter: Payer: Self-pay | Admitting: Nutrition

## 2020-11-05 NOTE — Addendum Note (Signed)
Addended by: Karl Luke A on: 11/05/2020 04:03 PM   Modules accepted: Orders

## 2020-11-08 ENCOUNTER — Telehealth: Payer: Self-pay | Admitting: Nurse Practitioner

## 2020-11-08 NOTE — Telephone Encounter (Signed)
It will be hard to adjust her insulin without seeing the readings ( I recommend writing them down over the next few days so we have a hard copy to go by if we are still seeing low readings).  For safety purposes, let's lower the 70/30 to 50 units BID with meals if glucose is above 90 and she is eating.  Make sure she eats 3 meals per day with a good protein source.

## 2020-11-08 NOTE — Telephone Encounter (Signed)
Called patients daughter and advised her on the message. Daughter verbalized an understanding.

## 2020-11-08 NOTE — Telephone Encounter (Signed)
Pt daughter called and states the patient sugar keeps bottoming at night since 9/21, she has fell twice when trying to get up in the night and the dexcom reader has turned to red which she states means it is going below 40. She could no provide me with readings because she said she does not know how to go back on the dexcom and read previous readings.

## 2020-11-16 ENCOUNTER — Other Ambulatory Visit: Payer: Self-pay | Admitting: "Endocrinology

## 2020-11-16 DIAGNOSIS — E1165 Type 2 diabetes mellitus with hyperglycemia: Secondary | ICD-10-CM

## 2020-11-20 ENCOUNTER — Other Ambulatory Visit: Payer: Self-pay

## 2020-11-20 ENCOUNTER — Ambulatory Visit (INDEPENDENT_AMBULATORY_CARE_PROVIDER_SITE_OTHER): Payer: Medicare HMO | Admitting: Neurology

## 2020-11-20 DIAGNOSIS — R202 Paresthesia of skin: Secondary | ICD-10-CM | POA: Diagnosis not present

## 2020-11-20 DIAGNOSIS — R259 Unspecified abnormal involuntary movements: Secondary | ICD-10-CM

## 2020-11-21 ENCOUNTER — Telehealth: Payer: Self-pay | Admitting: Nurse Practitioner

## 2020-11-21 NOTE — Telephone Encounter (Signed)
Pt states her daughter has questions about her thyroid and wants to speak with the nurse.  [phone].

## 2020-11-21 NOTE — Telephone Encounter (Signed)
Called patient and spoke to her and her daughter and the daughter stated that the discount savings card for Synthroid would not work at the pharmacy that they use and they filled out the patient assistance forms online and wanted [place] to be aware that we should receive something by fax to fill out for Synthroid. Patients daughter will contact us if they need further assistance.

## 2020-11-22 NOTE — Procedures (Signed)
ELECTROENCEPHALOGRAM REPORT  Date of Study: 11/20/2020  Patient's Name: Jaime Waller MRN: [id number] Date of Birth: 14-Sep-1945  Referring Provider: Dr. Nita Sickle  Clinical History: This is a 75 year old woman with shaking spells with no loss of consciousness.  Medications: VENTOLIN HFA 108 (90 Base) MCG/ACT inhaler XANAX 0.5 MG tablet aspirin EC 81 MG tablet TESSALON 200 MG capsule ALPHAGAN 0.2 % ophthalmic solution WELCHOL 625 MG tablet BENTYL 10 MG capsule FLONASE 50 MCG/ACT nasal spray LASIX 40 MG tablet NEURONTIN 300 MG capsule GLUCOTROL XL 10 MG 24 hr tablet NORCO/VICODIN 5-325 MG tablet NOVOLOG MIX 70/30 FLEXPEN (70-30) 100 UNIT/ML FlexPen PREVACID 30 MG capsule XYZAL 5 MG tablet SYNTHROID 112 MCG tablet MOBIC 7.5 MG tablet GLUCOPHAGE 1000 MG tablet LOPRESSOR 25 MG tablet MACRODANTIN 50 MG capsule MYCOSTATIN PYRIDIUM 200 MG tablet MIRAPEX 1 MG tablet DELTASONE 5 MG tablet OZEMPIC, 1 MG/DOSE 2 MG/1.5ML SOPN STOOL SOFTENER 100 MG capsule KENALOG 0.1 % DYAZIDE 37.5-25 MG capsule  Technical Summary: A multichannel digital EEG recording measured by the international 10-20 system with electrodes applied with paste and impedances below 5000 ohms performed in our laboratory with EKG monitoring in an awake and asleep patient.  Hyperventilation was not performed. Photic stimulation was performed.  The digital EEG was referentially recorded, reformatted, and digitally filtered in a variety of bipolar and referential montages for optimal display.    Description: The patient is awake and asleep during the recording.  During maximal wakefulness, there is a symmetric, medium voltage 8 Hz posterior dominant rhythm that attenuates with eye opening.  The record is symmetric.  During drowsiness and stage I sleep, there is an increase in theta slowing of the background with vertex waves seen. Photic stimulation did not elicit any abnormalities.  There were no epileptiform discharges  or electrographic seizures seen.    EKG lead was unremarkable.  Impression: This awake and asleep EEG is normal.    Clinical Correlation: A normal EEG does not exclude a clinical diagnosis of epilepsy.  If further clinical questions remain, prolonged EEG may be helpful.  Clinical correlation is advised.   Patrcia Dolly, M.D.

## 2020-12-06 ENCOUNTER — Other Ambulatory Visit: Payer: Self-pay

## 2020-12-06 MED ORDER — GLIPIZIDE ER 10 MG PO TB24: 10.0000 mg | ORAL_TABLET | Freq: Every day | ORAL | 0 refills | Status: DC

## 2020-12-06 MED ORDER — BD PEN NEEDLE MINI U/F 31G X 5 MM MISC: 5 refills | Status: DC

## 2020-12-06 MED ORDER — METFORMIN HCL 1000 MG PO TABS
1000.0000 mg | ORAL_TABLET | Freq: Two times a day (BID) | ORAL | 0 refills | Status: DC
Start: 2020-12-06 — End: 2021-02-19

## 2020-12-10 ENCOUNTER — Telehealth: Payer: Self-pay | Admitting: Nurse Practitioner

## 2020-12-10 ENCOUNTER — Other Ambulatory Visit: Payer: Self-pay

## 2020-12-10 NOTE — Telephone Encounter (Signed)
I have not seen this fax yet and will keep an eye out for it. There are multiple [employer] listed in different states on electronic order side.

## 2020-12-10 NOTE — Telephone Encounter (Signed)
Pt states we should be receiving a refill request from [facility] for Synthroid and states 90 day supply is the cheapest.

## 2020-12-11 ENCOUNTER — Other Ambulatory Visit: Payer: Self-pay

## 2020-12-11 MED ORDER — LEVOTHYROXINE SODIUM 112 MCG PO TABS: 112.0000 ug | ORAL_TABLET | Freq: Every day | ORAL | 1 refills | Status: DC

## 2020-12-11 NOTE — Telephone Encounter (Signed)
Pt daughter called back and states the pharmacy told her they sent it 2 weeks ago and yesterday. States she has been without her synthroid for a month now. It is the [facility] in [place] per pt daughter

## 2020-12-11 NOTE — Telephone Encounter (Signed)
I called the patients daughter and let her know that I have sent this prescription to the pharmacy based on the information that we have. A few different pharmacies came up in the system with similar information and I gave the information to the daughter and she was currently on the phone with [facility] and will confirm that they received the prescription. Melissa verbalized an understanding and thanked [place].

## 2021-02-03 ENCOUNTER — Encounter: Payer: Self-pay | Admitting: Nurse Practitioner

## 2021-02-03 ENCOUNTER — Ambulatory Visit (INDEPENDENT_AMBULATORY_CARE_PROVIDER_SITE_OTHER): Payer: Medicare HMO | Admitting: Nurse Practitioner

## 2021-02-03 ENCOUNTER — Other Ambulatory Visit: Payer: Self-pay

## 2021-02-03 ENCOUNTER — Encounter: Payer: Self-pay | Admitting: Nutrition

## 2021-02-03 ENCOUNTER — Encounter: Payer: Medicare HMO | Attending: Nurse Practitioner | Admitting: Nutrition

## 2021-02-03 ENCOUNTER — Telehealth: Payer: Self-pay

## 2021-02-03 VITALS — Wt 221.0 lb

## 2021-02-03 VITALS — BP 133/72 | HR 84 | Ht 63.0 in | Wt 221.4 lb

## 2021-02-03 DIAGNOSIS — E039 Hypothyroidism, unspecified: Secondary | ICD-10-CM

## 2021-02-03 DIAGNOSIS — E782 Mixed hyperlipidemia: Secondary | ICD-10-CM | POA: Insufficient documentation

## 2021-02-03 DIAGNOSIS — E669 Obesity, unspecified: Secondary | ICD-10-CM | POA: Diagnosis present

## 2021-02-03 DIAGNOSIS — E1165 Type 2 diabetes mellitus with hyperglycemia: Secondary | ICD-10-CM | POA: Diagnosis present

## 2021-02-03 DIAGNOSIS — I10 Essential (primary) hypertension: Secondary | ICD-10-CM

## 2021-02-03 LAB — POCT GLYCOSYLATED HEMOGLOBIN (HGB A1C): HbA1c, POC (controlled diabetic range): 9.6 % — AB (ref 0.0–7.0)

## 2021-02-03 MED ORDER — FLUCONAZOLE 200 MG PO TABS: 200.0000 mg | ORAL_TABLET | Freq: Once | ORAL | 0 refills | Status: AC

## 2021-02-03 MED ORDER — NOVOLOG MIX 70/30 FLEXPEN (70-30) 100 UNIT/ML ~~LOC~~ SUPN: 55.0000 [IU] | PEN_INJECTOR | Freq: Two times a day (BID) | SUBCUTANEOUS | 3 refills | Status: DC

## 2021-02-03 NOTE — Patient Instructions (Signed)

## 2021-02-03 NOTE — Telephone Encounter (Signed)
Patient called to say she is having spells where she is standing/walking and her body starts jerking backwards several times in a row, then it gets to where she can get back to her chair again.  Patient said she has not been to physical therapy.  I offered to switch her appointment tomorrow to a televisit so she can check-in with Dr. Ulice Bold and patient said she would like that.

## 2021-02-03 NOTE — Patient Instructions (Addendum)
Goals  Eat three meals per day at times discussed. Do not skip meals Take insulin as prescribed before breakfast and bedtime. Drink only water-5 bottles per day Do not eat sweets, sodas, cakes or desserts.

## 2021-02-03 NOTE — Progress Notes (Signed)
02/03/2021, 2:55 PM   Endocrinology follow-up note    Subjective:    Patient ID: Jaime Waller, female    DOB: [birth year].  Jaime Waller is being seen in follow-up after she was seen in consultation for  management of currently uncontrolled symptomatic diabetes requested by  Jonathon Bellows, DO.   Past Medical History:  Diagnosis Date   Anxiety    Asthma    Fibromyalgia    Hypertension    Hypothyroidism    Mixed hyperlipidemia    Opioid abuse (HCC)    Osteopenia    Restless leg    Type II diabetes mellitus, uncontrolled    Venous insufficiency    Vitamin D deficiency      Social History   Socioeconomic History   Marital status: Married    Spouse name: Not on file   Number of children: 2   Years of education: Not on file   Highest education level: Not on file  Occupational History   Not on file  Tobacco Use   Smoking status: Former    Packs/day: 1.50    Types: Cigarettes    Quit date: 68    Years since quitting: 35.9   Smokeless tobacco: Never  Vaping Use   Vaping Use: Never used  Substance and Sexual Activity   Alcohol use: Never   Drug use: Never   Sexual activity: Not on file  Other Topics Concern   Not on file  Social History Narrative   Right Handed    Lives in a one story    Social Determinants of Health   Financial Resource Strain: Not on file  Food Insecurity: Not on file  Transportation Needs: Not on file  Physical Activity: Not on file  Stress: Not on file  Social Connections: Not on file    Family History  Problem Relation Age of Onset   Cancer Mother        Bladder Cancer & Breast Cancer   Diabetes Mellitus II Mother    Thyroid disease Mother    Hypertension Mother    CAD Mother    Kidney disease Mother    Heart failure Mother    Cancer Brother        Lung Cancer   Hypertension Brother    Diabetes Mellitus II Brother     Outpatient Encounter  Medications as of 02/03/2021  Medication Sig   Accu-Chek FastClix Lancets MISC USE AS DIRECTED TWICE DAILY   albuterol (VENTOLIN HFA) 108 (90 Base) MCG/ACT inhaler 4 (four) times daily as needed.   ALPRAZolam (XANAX) 0.5 MG tablet daily.   aspirin EC 81 MG tablet Take 81 mg by mouth daily.   azelastine (ASTELIN) 0.1 % nasal spray  USE 2 SPRAY(S) IN EACH NOSTRIL TWICE DAILY   b complex vitamins capsule Take 1 capsule by mouth daily.   benzonatate (TESSALON) 200 MG capsule daily as needed.   brimonidine (ALPHAGAN) 0.2 % ophthalmic solution    dicyclomine (BENTYL) 10 MG capsule 10 mg 3 (three) times daily before meals.    DULoxetine (CYMBALTA) 60 MG capsule Take by mouth.   famotidine (PEPCID) 20 MG tablet famotidine 20 mg tablet  TAKE 1 TABLET BY MOUTH TWICE DAILY   fluconazole (DIFLUCAN) 200 MG tablet Take 1 tablet (200 mg total) by mouth once for 1 dose.   fluticasone (FLONASE) 50 MCG/ACT nasal spray 2 sprays daily.   furosemide (LASIX) 40 MG tablet daily.   gabapentin (NEURONTIN) 300 MG capsule at bedtime.   glipiZIDE (GLUCOTROL XL) 10 MG 24 hr tablet Take 1 tablet (10 mg total) by mouth daily with breakfast.   glucose blood (ACCU-CHEK GUIDE) test strip Use as instructed to monitor glucose 4 times daily.   Use as back up method for CGM   HYDROcodone-acetaminophen (NORCO/VICODIN) 5-325 MG tablet 2 (two) times daily.   ibuprofen (ADVIL) 200 MG tablet Take 200 mg by mouth every 6 (six) hours as needed.   Insulin Pen Needle (B-D UF III MINI PEN NEEDLES) 31G X 5 MM MISC USE 1 PEN NEEDLE 3 TIMES DAILY AS DIRECTED   lansoprazole (PREVACID) 30 MG capsule Take by mouth.   levocetirizine (XYZAL) 5 MG tablet Take 5 mg by mouth daily as needed.   levocetirizine (XYZAL) 5 MG tablet Take by mouth.   levothyroxine (SYNTHROID) 112 MCG tablet Take 1 tablet (112 mcg total) by mouth daily before breakfast.   meloxicam (MOBIC) 7.5 MG tablet Take 7.5 mg by mouth daily.   metFORMIN (GLUCOPHAGE) 1000 MG  tablet Take 1 tablet (1,000 mg total) by mouth 2 (two) times daily.   methocarbamol (ROBAXIN) 500 MG tablet Take by mouth.   metoCLOPramide (REGLAN) 5 MG tablet Take by mouth.   metoprolol tartrate (LOPRESSOR) 25 MG tablet Take 25 mg by mouth 2 (two) times daily with a meal.   nitrofurantoin (MACRODANTIN) 50 MG capsule Take 50 mg by mouth daily.   nystatin cream (MYCOSTATIN) 2 (two) times daily.   PERCOCET 5-325 MG tablet 1 tablet PO Q 6 H PRN  - Pain   phenazopyridine (PYRIDIUM) 200 MG tablet Take 200 mg by mouth as needed.   pramipexole (MIRAPEX) 1 MG tablet Take 1 tablet by mouth 2 (two) times daily.    predniSONE (DELTASONE) 5 MG tablet    STOOL SOFTENER 100 MG capsule 2 capsules daily.   triamcinolone cream (KENALOG) 0.1 %    triamterene-hydrochlorothiazide (DYAZIDE) 37.5-25 MG capsule Take 1 capsule by mouth daily.   vitamin B-12 (CYANOCOBALAMIN) 1000 MCG tablet Take 1,000 mcg by mouth daily.   Vitamin D, Ergocalciferol, (DRISDOL) 1.25 MG (50000 UT) CAPS capsule Take 50,000 Units by mouth once a week.   [DISCONTINUED] insulin aspart protamine - aspart (NOVOLOG MIX 70/30 FLEXPEN) (70-30) 100 UNIT/ML FlexPen Inject 60 Units into the skin 2 (two) times daily with a meal. (Patient taking differently: Inject 70 Units into the skin 2 (two) times daily with a meal. Inject 70 units subcutaneously twice daily before a meal.)   insulin aspart protamine - aspart (NOVOLOG MIX 70/30 FLEXPEN) (70-30) 100 UNIT/ML FlexPen Inject 55 Units into the skin 2 (two) times daily with a meal.   No facility-administered encounter medications on file as of 02/03/2021.    ALLERGIES: Allergies  Allergen Reactions   Lisinopril Swelling   Amoxicillin    Dapagliflozin    Ezetimibe    Hydroxychloroquine    Metronidazole    Naproxen    Pioglitazone    Plaquenil  [Hydroxychloroquine Sulfate]    Statins    Wound Dressings     VACCINATION STATUS:  There is no immunization history on file for this  patient.  Diabetes She presents for her follow-up diabetic visit. She has  type 2 diabetes mellitus. Onset time: She was diagnosed at approximate age of 72 years.  She has a previous history of gestational diabetes. Her disease course has been fluctuating. Hypoglycemia symptoms include sweats and tremors. Pertinent negatives for hypoglycemia include no confusion, headaches, nervousness/anxiousness, pallor or seizures. Associated symptoms include fatigue, foot paresthesias and polyuria. Pertinent negatives for diabetes include no chest pain, no polydipsia, no polyphagia and no weight loss. There are no hypoglycemic complications. Symptoms are improving. Diabetic complications include peripheral neuropathy. Risk factors for coronary artery disease include diabetes mellitus, dyslipidemia, family history, hypertension, obesity, sedentary lifestyle, post-menopausal and tobacco exposure. Current diabetic treatment includes insulin injections and oral agent (dual therapy). She is compliant with treatment most of the time. Her weight is fluctuating minimally. She is following a generally healthy diet. When asked about meal planning, she reported none. She has had a previous visit with a dietitian. She never participates in exercise. Her home blood glucose trend is fluctuating minimally. Her breakfast blood glucose range is generally 180-200 mg/dl. Her lunch blood glucose range is generally 180-200 mg/dl. Her dinner blood glucose range is generally 180-200 mg/dl. Her bedtime blood glucose range is generally >200 mg/dl. Her overall blood glucose range is >200 mg/dl. (She presents today, accompanied by her daughter, with her CGM, no logs showing above target fasting and postprandial glycemic profile.  Her POCT A1c today is 9.6%, increasing from last visit of 8.2%.  She called in between visits for hypoglycemic episode and her insulin was adjusted.  Analysis of her CGM shows TIR 26%, TAR 73%, TBR < 2%.  She does have an  erratic sleeping pattern.) An ACE inhibitor/angiotensin II receptor blocker is contraindicated. She sees a podiatrist.Eye exam is current.  Thyroid Problem Presents for follow-up visit. Onset time: She is saying that she was diagnosed with hypothyroidism more than 20 years ago, has been on her current dose of levothyroxine 75 mcg for more than 5 years. Symptoms include fatigue, hair loss and tremors. Patient reports no anxiety, cold intolerance, constipation, depressed mood, diarrhea, heat intolerance, palpitations, weight gain or weight loss. (He brought in bags of hair which she has lost on any given day.  Her daughter also had to cut mats out of her hair.  She was wondering if this hair loss was related to her thyroid levels.) The symptoms have been stable. Her past medical history is significant for diabetes, hyperlipidemia, neuropathy and obesity. Risk factors include family history of hypothyroidism.  Hyperlipidemia This is a chronic problem. The current episode started more than 1 year ago. The problem is controlled. Recent lipid tests were reviewed and are normal. Exacerbating diseases include diabetes, hypothyroidism and obesity. Factors aggravating her hyperlipidemia include thiazides and fatty foods. Pertinent negatives include no chest pain, myalgias or shortness of breath. She is currently on no antihyperlipidemic treatment (statin intolerant. on welchol). Compliance problems include adherence to diet and adherence to exercise.  Risk factors for coronary artery disease include diabetes mellitus, dyslipidemia, a sedentary lifestyle, post-menopausal, family history, obesity and hypertension.   Review of systems  Constitutional: steady weight,  current Body mass index is 39.22 kg/m. 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4 kg)    SpO2 98%    BMI 39.22 kg/m   Wt Readings from Last 3 Encounters:  02/03/21 221 lb 6.4 oz (100.4 kg)  11/04/20 222 lb (100.7 kg)  11/04/20 222 lb 9.6 oz (101 kg)    BP Readings from Last 3 Encounters:  02/03/21 133/72  11/04/20 126/77  11/01/20 138/76     Physical Exam- Limited  Constitutional:  Body mass index is 39.22 kg/m. , not in acute distress, normal state of mind Eyes:  EOMI, no exophthalmos Neck: Supple Cardiovascular: RRR, no murmurs, rubs, or gallops, + 1+ pitting edema to BLE with blistering Respiratory: Adequate breathing efforts, no crackles, rales, rhonchi, or wheezing Musculoskeletal: no gross deformities, strength intact in all four extremities, no gross restriction of joint movements Skin: scattered fluid filled blisters noted to BLE Neurological: no tremor with outstretched hands    Recent Results (from the past 2160 hour(s))  POCT glycosylated hemoglobin (Hb A1C)     Status: Abnormal   Collection Time: 02/03/21  2:30 PM  Result Value Ref Range   Hemoglobin A1C     HbA1c POC (<> result, manual entry)     HbA1c, POC (prediabetic range)     HbA1c, POC (controlled diabetic range) 9.6 (A) 0.0 - 7.0 %       Assessment & Plan:   1) Uncontrolled type 2 diabetes mellitus with hyperglycemia (HCC)  - Arna Luis has  currently uncontrolled symptomatic type 2 DM since 75 years of age.   She presents today, accompanied by her daughter, with her CGM, no logs showing above target fasting and postprandial glycemic profile.  Her POCT A1c today is 9.6%, increasing from last visit of 8.2%.  She called in between visits for hypoglycemic episode and her insulin was adjusted.  Analysis of her CGM shows TIR 26%, TAR 73%, TBR < 2%.  She does have an erratic sleeping pattern.  - Recent labs reviewed.  - I had a long discussion with her about the progressive nature of diabetes and the pathology behind its complications. -her diabetes is complicated by obesity/sedentary life, peripheral neuropathy and she remains at a high risk for more acute and chronic complications which include CAD, CVA, CKD, retinopathy, and neuropathy. These are all discussed in detail with her.  - Nutritional counseling repeated at each appointment due to patients tendency to fall back in to old habits.  - The patient admits there is a room for improvement in their diet and drink choices. -  Suggestion is made for the patient to avoid simple carbohydrates from their diet including Cakes, Sweet Desserts / Pastries, Ice Cream, Soda (diet and regular), Sweet Tea, Candies, Chips, Cookies, Sweet Pastries, Store Bought Juices, Alcohol in Excess of 1-2 drinks a day, Artificial Sweeteners, Coffee Creamer, and "Sugar-free" Products. This will help patient to have stable blood glucose profile and potentially avoid unintended weight gain.   - I encouraged the patient to switch to unprocessed or minimally processed complex starch and increased protein intake (animal or plant source), fruits, and vegetables.   - Patient is advised to stick to  a routine mealtimes to eat 3 meals a day and avoid unnecessary snacks (to snack only to correct hypoglycemia).  - I have approached her with the following individualized plan to manage  her diabetes and patient agrees:   -  she will continue to need multiple daily injections of insulin in order for her to achieve and maintain control of diabetes to target.  Cost is a major concern for her.  -For simplicity reasons, we agreed that premixed insulin is the best choice.    -Based on her hyperglycemia, she is advised to increase her 70/30 insulin to 55 units BID with breakfast and supper if glucose is above 90 and she is eating.  She is also advised to continue Ozempic 1 mg SQ weekly along with her Metformin 1000 mg po twice daily with meals and Glipizide 10 mg XL daily with breakfast.    -She is encouraged to continue consistently monitoring glucose at least 3 times per day, before injecting insulin (at breakfast and supper) and before bed using her CGM device.  She is encouraged to call the clinic if she has readings less than 70 or greater than 300 for 3 tests in a row.  - she is warned not to take insulin without proper monitoring per orders.  - Patient specific target  A1c;  LDL, HDL, Triglycerides, were discussed in detail.  2) Blood Pressure /Hypertension: Her blood pressure is controlled to target.  She is advised to continue Lasix 40 mg po daily, Metoprolol 25 mg po twice daily, and Triamterene-HCT 37.5-25 mg po daily.    3) Lipids/Hyperlipidemia:  Her most recent lipid panel from 08/06/20 shows uncontrolled LDL of 114 and elevated triglycerides of 195 (improving).  Insurance would not cover her Repatha.  She is intolerant to statins and has Zetia listed as an allergy.  She is taking her Welchol.  She is advised to limit fried foods and butter and increase her exercise.  4)  Weight/Diet:  Her Body mass index is 39.22 kg/m.-  clearly complicating her diabetes care.  She is a candidate for modest weight loss.  I discussed with her the fact that loss of 5 - 10% of her  current body weight will have the most impact on her diabetes management.  CDE Consult will be initiated . Exercise, and detailed carbohydrates  information provided  -  detailed on discharge instructions.  5) Hypothyroidism-longstanding -There are no recent TFTs to review.  She is advised to stay on same dose of Synthroid 112 mcg po daily before breakfast.  Will recheck TFTs prior to next visit and adjust dose if needed.    - We discussed about the correct intake of her thyroid hormone, on empty stomach at fasting, with water, separated by at least 30 minutes from breakfast and other medications,  and separated by more than 4 hours from calcium, iron, multivitamins, acid reflux medications (PPIs). -Patient is made aware of the fact that thyroid hormone replacement is needed for life, dose to be adjusted by periodic monitoring of thyroid function tests.  6) Chronic Care/Health Maintenance: -she is not on ACEI/ARB or Statin medications due to multiple allergies and is encouraged to initiate and continue to follow up with Ophthalmology, Dentist, Podiatrist at least yearly or according to recommendations, and advised to  stay away from smoking. I have recommended yearly flu vaccine and pneumonia vaccine at least every 5 years; moderate intensity exercise for up to 150 minutes weekly; and  sleep for at least 7 hours  a day.  - she is advised to locate her PMD for primary care needs, as well as her other providers for optimal and coordinated care.    I spent 40 minutes in the care of the patient today including review of labs from CMP, Lipids, Thyroid Function, Hematology (current and previous including abstractions from other facilities); face-to-face time discussing  her blood glucose readings/logs, discussing hypoglycemia and hyperglycemia episodes and symptoms, medications doses, her options of short and long term treatment based on the latest standards of care / guidelines;  discussion about incorporating lifestyle medicine;  and documenting the encounter.    Please refer to Patient Instructions for Blood Glucose Monitoring and  Insulin/Medications Dosing Guide"  in media tab for additional information. Please  also refer to " Patient Self Inventory" in the Media  tab for reviewed elements of pertinent patient history.  [name] participated in the discussions, expressed understanding, and voiced agreement with the above plans.  All questions were answered to her satisfaction. she is encouraged to contact clinic should she have any questions or concerns prior to her return visit.   Follow up plan: - Return in about 3 months (around 05/04/2021) for Diabetes F/U- A1c and UM in office, Previsit labs, Bring meter and logs.  Ronny Bacon, [facility] [address] [place], [place] [address] Phone: [phone] Fax: [phone]  02/03/2021, 2:55 PM

## 2021-02-03 NOTE — Progress Notes (Signed)
°  Medical Nutrition Therapy:  DM Follow up  Appt start time: 1430 end time: 1445 Assessment:  Primary concerns today: Diabetes Type 2, Obesity.  Saw Jaime Bacon, FNP today at [facility].  A1C increased to 9.6% from 8.2%. Dexcom shows TIR only 26% . BS are consistently elevated. She notes she is not hungry and doesn't take her insulin. Has a lot of pain in her legs. Doesn't sleep much at night Has had some jerking movements and trouble walking. Whitnessed jerking movements with head tilting back and almost losing balance and eyes rolling. Sat patient down in chair. She notes she has been having these issues. Has a mychart visit tomorrow with MD.  Non compliance with medications and meal plans.  Recommend referral to [facility] to help with her mental health.    Lab Results  Component Value Date   HGBA1C 8.2 (A) 11/04/2020    Glipizide, Metformin 1000 mg BID, Ozempic weekly  Preferred Learning Style: Auditory Visual Hands on  Learning Readiness:  Ready Change in progress   MEDICATIONS:   DIETARY INTAKE:    24-hr recall:  Doesn't eat much during the day since she is usually sleeping and stays up and snacks most of the night.  Beverages: coffee, sodas, diet sodas, tea Usual physical activity:   Estimated energy needs: 1200  calories 133g carbohydrates 90 g protein 33 g fat  Progress Towards Goal(s):  In progress.   Nutritional Diagnosis:  NB-1.1 Food and nutrition-related knowledge deficit As related to DIabetes Type 2.  As evidenced by A1c 7.9%.    Intervention:  Stressed need to eat 3 meals per day at times discussed, take insulin as prescribed before breakfast and before dinner. Dangers of high blood sugars. Eating three balanced meals and only drinking water.   Goals  Eat three meals per day at times discussed. Do not skip meals Take insulin as prescribed before breakfast and bedtime. Drink only water-5 bottles per day Do not eat sweets, sodas, cakes or  desserts.   Teaching Method Utilized:  Visual Auditory Hands on  Handouts given during visit include: The Plate Method  Meal Plan Card  Diabetes instrucitons.   Barriers to learning/adherence to lifestyle change: non3  Demonstrated degree of understanding via:  Teach Back   Monitoring/Evaluation:  Dietary intake, exercise, , and body weight in  3 month(s). Recommend to review medications of reglan or other medications that may have side effects of uncontrolled muscle movements. Evaluate for polypharmacy.

## 2021-02-04 ENCOUNTER — Ambulatory Visit (INDEPENDENT_AMBULATORY_CARE_PROVIDER_SITE_OTHER): Payer: Medicare HMO | Admitting: Plastic Surgery

## 2021-02-04 DIAGNOSIS — M793 Panniculitis, unspecified: Secondary | ICD-10-CM | POA: Diagnosis not present

## 2021-02-04 NOTE — Progress Notes (Signed)
° °  Subjective:    Patient ID: Jaime Waller, female    DOB: 01/12/1946, 75 y.o.   MRN: [id number]  The patient is a 75 yrs old female joining me by telephone for further discussion of her pannus.  The patient was seen in the office in March for a possible panniculectomy.  She is 5 feet 4 inches tall and weighs 221 pounds which is down from 235 pounds.  She still has the constant back pain and rashes in her skin folds of her pannus and her abdomen.  She has used creams, lotions and powders without any long-term benefits.  She was able to reduce her hemoglobin A1c from 11 to 9 but it is now back up to 9.6.  We sent her for physical therapy but she has not been able to do it yet due to back pain and neuropathy in her feet.  She quit smoking 35 years ago.  She has a past medical history that includes diabetes, restless leg syndrome, keloids, hypertension, hyperlipidemia and thyroid disease.  She has undergone a hysterectomy, left carpal tunnel, lower extremity surgery and a tonsillectomy.  I was not able to ascertain whether or not she has an abdominal hernia on her exam.  She is now on Lasix due to her leg swelling.  She was referred to the healthy weight [facility] wellness center but has not been able to go yet because of her leg swelling and pain.  She is still interested in a panniculectomy but understands that her risks are very high right now     Review of Systems  Constitutional:  Positive for activity change. Negative for appetite change.  Eyes: Negative.   Respiratory:  Negative for chest tightness and shortness of breath.   Cardiovascular:  Positive for leg swelling.  Endocrine: Negative.   Musculoskeletal:  Positive for back pain and neck pain.  Skin:  Positive for color change and rash.  Hematological: Negative.       Objective:   Physical Exam      Assessment & Plan:     ICD-10-CM   1. Panniculitis  M79.3        I connected with  [name] on 02/04/21 by telephone due to  computer issues.  I verified that I am speaking with the correct person using two identifiers.  I spent 15 minutes in the following manner: Review of the chart, discussion with the patient and documentation.   I discussed the limitations of evaluation and management by telemedicine. The patient expressed understanding and agreed to proceed.  The patient will have her hemoglobin A1c rechecked in 3 months.  She will call and let me know her new member while she continues to work on her weight reduction.  She knows to call me if she needs a another referral for the healthy weight and wellness center or physical therapy.

## 2021-02-12 ENCOUNTER — Other Ambulatory Visit: Payer: Self-pay | Admitting: Nurse Practitioner

## 2021-02-18 ENCOUNTER — Other Ambulatory Visit: Payer: Self-pay | Admitting: Nurse Practitioner

## 2021-04-26 ENCOUNTER — Other Ambulatory Visit: Payer: Self-pay | Admitting: Nurse Practitioner

## 2021-04-30 LAB — COMPREHENSIVE METABOLIC PANEL
ALT: 37 IU/L — ABNORMAL HIGH (ref 0–32)
AST: 74 IU/L — ABNORMAL HIGH (ref 0–40)
Albumin/Globulin Ratio: 1.6 (ref 1.2–2.2)
Albumin: 3.9 g/dL (ref 3.7–4.7)
Alkaline Phosphatase: 84 IU/L (ref 44–121)
BUN/Creatinine Ratio: 12 (ref 12–28)
BUN: 8 mg/dL (ref 8–27)
Bilirubin Total: <0.2 mg/dL (ref 0.0–1.2)
CO2: 27 mmol/L (ref 20–29)
Calcium: 9.2 mg/dL (ref 8.7–10.3)
Chloride: 99 mmol/L (ref 96–106)
Creatinine, Ser: 0.66 mg/dL (ref 0.57–1.00)
Globulin, Total: 2.4 g/dL (ref 1.5–4.5)
Glucose: 187 mg/dL — ABNORMAL HIGH (ref 70–99)
Potassium: 4.6 mmol/L (ref 3.5–5.2)
Sodium: 142 mmol/L (ref 134–144)
Total Protein: 6.3 g/dL (ref 6.0–8.5)
eGFR: 91 mL/min/{1.73_m2} (ref >59)

## 2021-04-30 LAB — T4, FREE: Free T4: 1.26 ng/dL (ref 0.82–1.77)

## 2021-04-30 LAB — TSH: TSH: 4.53 u[IU]/mL — ABNORMAL HIGH (ref 0.450–4.500)

## 2021-05-02 ENCOUNTER — Telehealth (INDEPENDENT_AMBULATORY_CARE_PROVIDER_SITE_OTHER): Payer: Medicare HMO | Admitting: Neurology

## 2021-05-02 ENCOUNTER — Encounter: Payer: Self-pay | Admitting: Neurology

## 2021-05-02 VITALS — Ht 64.0 in | Wt 220.0 lb

## 2021-05-02 DIAGNOSIS — G629 Polyneuropathy, unspecified: Secondary | ICD-10-CM | POA: Diagnosis not present

## 2021-05-02 DIAGNOSIS — R259 Unspecified abnormal involuntary movements: Secondary | ICD-10-CM

## 2021-05-02 NOTE — Progress Notes (Signed)
? ?  Virtual Visit via Video Note ?The purpose of this virtual visit is to provide medical care while limiting exposure to the novel coronavirus.   ? ?Consent was obtained for video visit:  Yes.   ?Answered questions that patient had about telehealth interaction:  Yes.   ?I discussed the limitations, risks, security and privacy concerns of performing an evaluation and management service by telemedicine. I also discussed with the patient that there may be a patient responsible charge related to this service. The patient expressed understanding and agreed to proceed. ? ?Pt location: Home ?Physician Location: office ?Name of referring provider:  Sherrilee Gilles, DO ?I connected with Jaime Waller at patients initiation/request on 05/02/2021 at  2:30 PM EDT by video enabled telemedicine application and verified that I am speaking with the correct person using two identifiers. ?Pt MRN:  [id number] ?Pt DOB:  12-30-1945 ?Video Participants:  Jaime Waller ? ? ?History of Present Illness: This is a 76 y.o. female returning for follow-up of neuropathy and abnormal movements.  Her neuropathy has been stable and involves a stocking glove distribution.  Pain is managed by pain management with gabapentin 600mg  BID, Cymblta 60mg , and hydrocodone.  She has one fall after slipping off her chair and continues to have imbalance when walking.  ? ?She continues to have spells of whole body shaking.  As noted in my initial visit on 11/01/2020, this has been a chronic issue since 1989.  She has shaking involving the hands, legs, and sometimes torso.  The spells have been getting more intense lately.  They can be painful and uncomfortable.  Routine EEG performed here in October was normal.  Per patient, she also has MRI brain performed locally which was unremarkable (patient will forward these results to me).  ? ? ?Observations/Objective:   ?Vitals:  ? 05/02/21 1302  ?Weight: 220 lb (99.8 kg)  ?Height: 5\' 4"  (1.626 m)  ? ?Patient is awake,  alert, and appears comfortable.  Oriented x 4.   ?Extraocular muscles are intact. No ptosis.  Face is symmetric.  Speech is not dysarthric.  ?Antigravity in all extremities.  ? ? ?Assessment and Plan:  ? 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 ?2.  Paroxsymal, nonstereotyped, movements of the arms and leg, longstanding.  These seem to be more functional than due to a primary neurological pathology.  I will refer to movement disorder specialist for their opinion. ? ? ?Follow Up Instructions: ?  ?I discussed the assessment and treatment plan with the patient. The patient was provided an opportunity to ask questions and all were answered. The patient agreed with the plan and demonstrated an understanding of the instructions. ?  ?The patient was advised to call back or seek an in-person evaluation if the symptoms worsen or if the condition fails to improve as anticipated. ? ? ?Alda Berthold, DO ? ?

## 2021-05-06 ENCOUNTER — Other Ambulatory Visit: Payer: Self-pay

## 2021-05-06 ENCOUNTER — Encounter: Payer: Self-pay | Admitting: Nurse Practitioner

## 2021-05-06 ENCOUNTER — Ambulatory Visit (INDEPENDENT_AMBULATORY_CARE_PROVIDER_SITE_OTHER): Payer: Medicare HMO | Admitting: Nurse Practitioner

## 2021-05-06 VITALS — BP 124/69 | HR 106 | Ht 63.0 in | Wt 228.0 lb

## 2021-05-06 DIAGNOSIS — E1165 Type 2 diabetes mellitus with hyperglycemia: Secondary | ICD-10-CM | POA: Diagnosis not present

## 2021-05-06 DIAGNOSIS — E039 Hypothyroidism, unspecified: Secondary | ICD-10-CM

## 2021-05-06 DIAGNOSIS — E782 Mixed hyperlipidemia: Secondary | ICD-10-CM | POA: Diagnosis not present

## 2021-05-06 DIAGNOSIS — I10 Essential (primary) hypertension: Secondary | ICD-10-CM | POA: Diagnosis not present

## 2021-05-06 LAB — POCT GLYCOSYLATED HEMOGLOBIN (HGB A1C): HbA1c, POC (controlled diabetic range): 10.3 % — AB (ref 0.0–7.0)

## 2021-05-06 MED ORDER — NOVOLOG MIX 70/30 FLEXPEN (70-30) 100 UNIT/ML ~~LOC~~ SUPN: 70.0000 [IU] | PEN_INJECTOR | Freq: Two times a day (BID) | SUBCUTANEOUS | 3 refills | Status: DC

## 2021-05-06 MED ORDER — NOVOLOG MIX 70/30 FLEXPEN (70-30) 100 UNIT/ML ~~LOC~~ SUPN: 65.0000 [IU] | PEN_INJECTOR | Freq: Two times a day (BID) | SUBCUTANEOUS | 3 refills | Status: DC

## 2021-05-06 NOTE — Progress Notes (Signed)
? ?                                                   ?     05/06/2021, 3:10 PM ? ? ?Endocrinology follow-up note ? ? ? ?Subjective:  ? ? Patient ID: Jaime Waller, female    DOB: Nov 25, 1945.  ?Jaime Waller is being seen in follow-up after she was seen in consultation for  management of currently uncontrolled symptomatic diabetes requested by  Jonathon Bellows, DO. ? ? ?Past Medical History:  ?Diagnosis Date  ? Anxiety   ? Asthma   ? Fibromyalgia   ? Hypertension   ? Hypothyroidism   ? Mixed hyperlipidemia   ? Opioid abuse (HCC)   ? Osteopenia   ? Restless leg   ? Type II diabetes mellitus, uncontrolled   ? Venous insufficiency   ? Vitamin D deficiency   ? ? ? ?Social History  ? ?Socioeconomic History  ? Marital status: Married  ?  Spouse name: Not on file  ? Number of children: 2  ? Years of education: Not on file  ? Highest education level: Not on file  ?Occupational History  ? Not on file  ?Tobacco Use  ? Smoking status: Former  ?  Packs/day: 1.50  ?  Types: Cigarettes  ?  Quit date: 32  ?  Years since quitting: 36.2  ? Smokeless tobacco: Never  ?Vaping Use  ? Vaping Use: Never used  ?Substance and Sexual Activity  ? Alcohol use: Never  ? Drug use: Never  ? Sexual activity: Not on file  ?Other Topics Concern  ? Not on file  ?Social History Narrative  ? Right Handed   ? Lives in a one story   ? ?Social Determinants of Health  ? ?Financial Resource Strain: Not on file  ?Food Insecurity: Not on file  ?Transportation Needs: Not on file  ?Physical Activity: Not on file  ?Stress: Not on file  ?Social Connections: Not on file  ? ? ?Family History  ?Problem Relation Age of Onset  ? Cancer Mother   ?     Bladder Cancer & Breast Cancer  ? Diabetes Mellitus II Mother   ? Thyroid disease Mother   ? Hypertension Mother   ? CAD Mother   ? Kidney disease Mother   ? Heart failure Mother   ? Cancer Brother   ?     Lung Cancer  ? Hypertension Brother   ? Diabetes Mellitus II Brother   ? ? ?Outpatient Encounter  Medications as of 05/06/2021  ?Medication Sig  ? Accu-Chek FastClix Lancets MISC USE AS DIRECTED TWICE DAILY  ? albuterol (VENTOLIN HFA) 108 (90 Base) MCG/ACT inhaler 4 (four) times daily as needed.  ? ALPRAZolam (XANAX) 0.5 MG tablet daily.  ? aspirin EC 81 MG tablet Take 81 mg by mouth daily.  ? azelastine (ASTELIN) 0.1 % nasal spray  ?USE 2 SPRAY(S) IN EACH NOSTRIL TWICE DAILY  ? b complex vitamins capsule Take 1 capsule by mouth daily.  ? benzonatate (TESSALON) 200 MG capsule daily as needed.  ? brimonidine (ALPHAGAN) 0.2 % ophthalmic solution   ? Cholecalciferol (VITAMIN D3) 1.25 MG (50000 UT) CAPS Take 1 capsule by mouth once a week.  ? dicyclomine (BENTYL) 10 MG capsule 10 mg 3 (three) times daily before meals.   ? DROPLET PEN  NEEDLES 31G X 5 MM MISC USE AS DIRECTED THREE TIMES DAILY  ? DULoxetine (CYMBALTA) 60 MG capsule Take by mouth.  ? famotidine (PEPCID) 20 MG tablet famotidine 20 mg tablet ? TAKE 1 TABLET BY MOUTH TWICE DAILY  ? fluticasone (FLONASE) 50 MCG/ACT nasal spray 2 sprays daily.  ? furosemide (LASIX) 20 MG tablet Take 20 mg by mouth 2 (two) times daily.  ? gabapentin (NEURONTIN) 300 MG capsule at bedtime. Take one capsule  in the morning and two capsules at bedtime  ? gabapentin (NEURONTIN) 600 MG tablet Take 600 mg by mouth at bedtime.  ? glipiZIDE (GLUCOTROL XL) 10 MG 24 hr tablet TAKE 1 TABLET EVERY DAY WITH BREAKFAST  ? glucose blood (ACCU-CHEK GUIDE) test strip Use as instructed to monitor glucose 4 times daily.   Use as back up method for CGM  ? HYDROcodone-acetaminophen (NORCO/VICODIN) 5-325 MG tablet 2 (two) times daily.  ? ibuprofen (ADVIL) 200 MG tablet Take 200 mg by mouth every 6 (six) hours as needed.  ? lansoprazole (PREVACID) 30 MG capsule Take by mouth.  ? levocetirizine (XYZAL) 5 MG tablet Take 5 mg by mouth daily as needed.  ? levothyroxine (SYNTHROID) 112 MCG tablet Take 1 tablet (112 mcg total) by mouth daily before breakfast.  ? meloxicam (MOBIC) 7.5 MG tablet Take 7.5 mg  by mouth daily.  ? metFORMIN (GLUCOPHAGE) 1000 MG tablet TAKE 1 TABLET TWICE DAILY  ? methocarbamol (ROBAXIN) 500 MG tablet Take by mouth.  ? metoCLOPramide (REGLAN) 5 MG tablet Take by mouth.  ? metoprolol tartrate (LOPRESSOR) 25 MG tablet Take 25 mg by mouth 2 (two) times daily with a meal.  ? nitrofurantoin (MACRODANTIN) 50 MG capsule Take 50 mg by mouth daily.  ? nystatin (MYCOSTATIN) 100000 UNIT/ML suspension Take 5 mLs by mouth 4 (four) times daily.  ? nystatin cream (MYCOSTATIN) 2 (two) times daily.  ? PERCOCET 5-325 MG tablet 1 tablet PO Q 6 H PRN  - Pain  ? phenazopyridine (PYRIDIUM) 200 MG tablet Take 200 mg by mouth as needed.  ? pramipexole (MIRAPEX) 1 MG tablet Take 1 tablet by mouth 2 (two) times daily.   ? predniSONE (DELTASONE) 5 MG tablet   ? Sod Fluoride-Potassium Nitrate (PREVIDENT 5000 ENAMEL PROTECT) 1.1-5 % GEL After you floss, brush for two full minutes at night ONLY. Spit excess toothpaste out.  ? SODIUM FLUORIDE 5000 SENSITIVE 1.1-5 % GEL Take by mouth.  ? SSD 1 % cream Apply topically 2 (two) times daily.  ? STOOL SOFTENER 100 MG capsule 2 capsules daily.  ? triamcinolone cream (KENALOG) 0.1 %   ? triamterene-hydrochlorothiazide (DYAZIDE) 37.5-25 MG capsule Take 1 capsule by mouth daily.  ? vitamin B-12 (CYANOCOBALAMIN) 1000 MCG tablet Take 1,000 mcg by mouth daily.  ? Vitamin D, Ergocalciferol, (DRISDOL) 1.25 MG (50000 UT) CAPS capsule Take 50,000 Units by mouth once a week.  ? [DISCONTINUED] insulin aspart protamine - aspart (NOVOLOG MIX 70/30 FLEXPEN) (70-30) 100 UNIT/ML FlexPen Inject 55 Units into the skin 2 (two) times daily with a meal.  ? insulin aspart protamine - aspart (NOVOLOG MIX 70/30 FLEXPEN) (70-30) 100 UNIT/ML FlexPen Inject 70 Units into the skin 2 (two) times daily with a meal.  ? [DISCONTINUED] furosemide (LASIX) 40 MG tablet daily. (Patient not taking: Reported on 05/06/2021)  ? [DISCONTINUED] insulin aspart protamine - aspart (NOVOLOG MIX 70/30 FLEXPEN) (70-30) 100  UNIT/ML FlexPen Inject 65 Units into the skin 2 (two) times daily with a meal.  ? [DISCONTINUED] levocetirizine (XYZAL)  5 MG tablet Take by mouth. (Patient not taking: Reported on 05/06/2021)  ? [DISCONTINUED] metoprolol succinate (TOPROL-XL) 50 MG 24 hr tablet Take by mouth. (Patient not taking: Reported on 05/06/2021)  ? ?No facility-administered encounter medications on file as of 05/06/2021.  ? ? ?ALLERGIES: ?Allergies  ?Allergen Reactions  ? Lisinopril Swelling  ? Amoxicillin   ? Dapagliflozin   ? Ezetimibe   ? Hydroxychloroquine   ? Metronidazole   ? Naproxen   ? Pioglitazone   ? Plaquenil  [Hydroxychloroquine Sulfate]   ? Statins   ? Wound Dressings   ? ? ?VACCINATION STATUS: ? ?There is no immunization history on file for this patient. ? ?Diabetes ?She presents for her follow-up diabetic visit. She has type 2 diabetes mellitus. Onset time: She was diagnosed at approximate age of 76 years.  She has a previous history of gestational diabetes. Her disease course has been fluctuating. There are no hypoglycemic associated symptoms. Pertinent negatives for hypoglycemia include no confusion, headaches, nervousness/anxiousness, pallor or seizures. Associated symptoms include fatigue, foot paresthesias and polyuria. Pertinent negatives for diabetes include no chest pain, no polydipsia, no polyphagia and no weight loss. There are no hypoglycemic complications. Symptoms are stable. Diabetic complications include peripheral neuropathy. Risk factors for coronary artery disease include diabetes mellitus, dyslipidemia, family history, hypertension, obesity, sedentary lifestyle, post-menopausal and tobacco exposure. Current diabetic treatment includes insulin injections and oral agent (dual therapy) (and Ozempic). She is compliant with treatment most of the time. Her weight is fluctuating dramatically. She is following a generally healthy diet. When asked about meal planning, she reported none. She has had a previous visit  with a dietitian. She never participates in exercise. Her home blood glucose trend is increasing steadily. Her breakfast blood glucose range is generally >200 mg/dl. Her lunch blood glucose range is generally >2

## 2021-05-06 NOTE — Patient Instructions (Signed)

## 2021-05-08 ENCOUNTER — Other Ambulatory Visit: Payer: Self-pay

## 2021-05-08 DIAGNOSIS — R269 Unspecified abnormalities of gait and mobility: Secondary | ICD-10-CM

## 2021-05-08 DIAGNOSIS — G629 Polyneuropathy, unspecified: Secondary | ICD-10-CM

## 2021-05-08 DIAGNOSIS — R259 Unspecified abnormal involuntary movements: Secondary | ICD-10-CM

## 2021-05-08 NOTE — Progress Notes (Unsigned)
Referrals created and sent. [facility]. Phone: [phone]. Fax#: [phone] ?

## 2021-05-09 ENCOUNTER — Other Ambulatory Visit: Payer: Self-pay | Admitting: "Endocrinology

## 2021-07-09 ENCOUNTER — Other Ambulatory Visit: Payer: Self-pay | Admitting: Nurse Practitioner

## 2021-08-06 ENCOUNTER — Ambulatory Visit (INDEPENDENT_AMBULATORY_CARE_PROVIDER_SITE_OTHER): Payer: Medicare HMO | Admitting: Nurse Practitioner

## 2021-08-06 ENCOUNTER — Encounter: Payer: Self-pay | Admitting: Nurse Practitioner

## 2021-08-06 VITALS — BP 136/72 | HR 93 | Ht 63.0 in | Wt 233.0 lb

## 2021-08-06 DIAGNOSIS — E039 Hypothyroidism, unspecified: Secondary | ICD-10-CM

## 2021-08-06 DIAGNOSIS — E782 Mixed hyperlipidemia: Secondary | ICD-10-CM | POA: Diagnosis not present

## 2021-08-06 DIAGNOSIS — I10 Essential (primary) hypertension: Secondary | ICD-10-CM | POA: Diagnosis not present

## 2021-08-06 DIAGNOSIS — L97519 Non-pressure chronic ulcer of other part of right foot with unspecified severity: Secondary | ICD-10-CM

## 2021-08-06 DIAGNOSIS — E11621 Type 2 diabetes mellitus with foot ulcer: Secondary | ICD-10-CM

## 2021-08-06 DIAGNOSIS — E1165 Type 2 diabetes mellitus with hyperglycemia: Secondary | ICD-10-CM | POA: Diagnosis not present

## 2021-08-06 LAB — POCT GLYCOSYLATED HEMOGLOBIN (HGB A1C): HbA1c POC (<> result, manual entry): 11.1 % (ref 4.0–5.6)

## 2021-08-06 LAB — LIPID PANEL
LDL Cholesterol: 142
Triglycerides: 188 — AB (ref 40–160)

## 2021-08-06 LAB — HEPATIC FUNCTION PANEL
ALT: 28 U/L (ref 7–35)
AST: 60 — AB (ref 13–35)
Alkaline Phosphatase: 102 (ref 25–125)

## 2021-08-06 LAB — BASIC METABOLIC PANEL
BUN: 13 (ref 4–21)
Creatinine: 0.8 (ref 0.5–1.1)

## 2021-08-06 LAB — TSH: TSH: 5.94 — AB (ref 0.41–5.90)

## 2021-08-06 LAB — POCT UA - MICROALBUMIN
Albumin/Creatinine Ratio, Urine, POC: <30
Creatinine, POC: 200 mg/dL
Microalbumin Ur, POC: 10 mg/L

## 2021-08-06 LAB — COMPREHENSIVE METABOLIC PANEL: eGFR: 76

## 2021-08-06 MED ORDER — NOVOLOG MIX 70/30 FLEXPEN (70-30) 100 UNIT/ML ~~LOC~~ SUPN: 85.0000 [IU] | PEN_INJECTOR | Freq: Two times a day (BID) | SUBCUTANEOUS | 3 refills | Status: DC

## 2021-08-06 NOTE — Progress Notes (Signed)
08/06/2021, 4:35 PM   Endocrinology follow-up note    Subjective:    Patient ID: Jaime Waller, female    DOB: 02/06/1946.  Jaime Waller is being seen in follow-up after she was seen in consultation for  management of currently uncontrolled symptomatic diabetes requested by  Jonathon Bellows, DO.   Past Medical History:  Diagnosis Date   Anxiety    Asthma    Fibromyalgia    Hypertension    Hypothyroidism    Mixed hyperlipidemia    Opioid abuse (HCC)    Osteopenia    Restless leg    Type II diabetes mellitus, uncontrolled    Venous insufficiency    Vitamin D deficiency      Social History   Socioeconomic History   Marital status: Married    Spouse name: Not on file   Number of children: 2   Years of education: Not on file   Highest education level: Not on file  Occupational History   Not on file  Tobacco Use   Smoking status: Former    Packs/day: 1.50    Types: Cigarettes    Quit date: 25    Years since quitting: 36.4   Smokeless tobacco: Never  Vaping Use   Vaping Use: Never used  Substance and Sexual Activity   Alcohol use: Never   Drug use: Never   Sexual activity: Not on file  Other Topics Concern   Not on file  Social History Narrative   Right Handed    Lives in a one story    Social Determinants of Health   Financial Resource Strain: Not on file  Food Insecurity: Not on file  Transportation Needs: Not on file  Physical Activity: Not on file  Stress: Not on file  Social Connections: Not on file    Family History  Problem Relation Age of Onset   Cancer Mother        Bladder Cancer & Breast Cancer   Diabetes Mellitus II Mother    Thyroid disease Mother    Hypertension Mother    CAD Mother    Kidney disease Mother    Heart failure Mother    Cancer Brother        Lung Cancer   Hypertension Brother    Diabetes Mellitus II Brother     Outpatient Encounter  Medications as of 08/06/2021  Medication Sig   Accu-Chek FastClix Lancets MISC USE AS DIRECTED TWICE DAILY   albuterol (VENTOLIN HFA) 108 (90 Base) MCG/ACT inhaler 4 (four) times daily as needed.   ALPRAZolam (XANAX) 0.5 MG tablet daily.   aspirin EC 81 MG tablet Take 81 mg by mouth daily.   azelastine (ASTELIN) 0.1 % nasal spray  USE 2 SPRAY(S) IN EACH NOSTRIL TWICE DAILY   b complex vitamins capsule Take 1 capsule by mouth daily.   benzonatate (TESSALON) 200 MG capsule daily as needed.   brimonidine (ALPHAGAN) 0.2 % ophthalmic solution    Cholecalciferol (VITAMIN D3) 1.25 MG (50000 UT) CAPS Take 1 capsule by mouth once a week.   dicyclomine (BENTYL) 10 MG capsule 10 mg 3 (three) times daily before meals.    DROPLET PEN  NEEDLES 31G X 5 MM MISC USE AS DIRECTED THREE TIMES DAILY   DULoxetine (CYMBALTA) 60 MG capsule Take by mouth.   famotidine (PEPCID) 20 MG tablet famotidine 20 mg tablet  TAKE 1 TABLET BY MOUTH TWICE DAILY   fluticasone (FLONASE) 50 MCG/ACT nasal spray 2 sprays daily.   furosemide (LASIX) 20 MG tablet Take 20 mg by mouth 2 (two) times daily.   gabapentin (NEURONTIN) 300 MG capsule at bedtime. Take one capsule  in the morning and two capsules at bedtime   gabapentin (NEURONTIN) 600 MG tablet Take 600 mg by mouth at bedtime.   glipiZIDE (GLUCOTROL XL) 10 MG 24 hr tablet TAKE 1 TABLET EVERY DAY WITH BREAKFAST   glucose blood (ACCU-CHEK GUIDE) test strip Use as instructed to monitor glucose 4 times daily.   Use as back up method for CGM   HYDROcodone-acetaminophen (NORCO/VICODIN) 5-325 MG tablet 2 (two) times daily.   ibuprofen (ADVIL) 200 MG tablet Take 200 mg by mouth every 6 (six) hours as needed.   insulin aspart protamine - aspart (NOVOLOG MIX 70/30 FLEXPEN) (70-30) 100 UNIT/ML FlexPen Inject 85 Units into the skin 2 (two) times daily with a meal.   lansoprazole (PREVACID) 30 MG capsule Take by mouth.   levocetirizine (XYZAL) 5 MG tablet Take 5 mg by mouth daily as needed.    meloxicam (MOBIC) 7.5 MG tablet Take 7.5 mg by mouth daily.   metFORMIN (GLUCOPHAGE) 1000 MG tablet TAKE 1 TABLET TWICE DAILY   methocarbamol (ROBAXIN) 500 MG tablet Take by mouth.   metoCLOPramide (REGLAN) 5 MG tablet Take by mouth.   metoprolol tartrate (LOPRESSOR) 25 MG tablet Take 25 mg by mouth 2 (two) times daily with a meal.   nitrofurantoin (MACRODANTIN) 50 MG capsule Take 50 mg by mouth daily.   nystatin (MYCOSTATIN) 100000 UNIT/ML suspension Take 5 mLs by mouth 4 (four) times daily.   nystatin cream (MYCOSTATIN) 2 (two) times daily.   PERCOCET 5-325 MG tablet 1 tablet PO Q 6 H PRN  - Pain   phenazopyridine (PYRIDIUM) 200 MG tablet Take 200 mg by mouth as needed.   pramipexole (MIRAPEX) 1 MG tablet Take 1 tablet by mouth 2 (two) times daily.    predniSONE (DELTASONE) 5 MG tablet    Sod Fluoride-Potassium Nitrate (PREVIDENT 5000 ENAMEL PROTECT) 1.1-5 % GEL After you floss, brush for two full minutes at night ONLY. Spit excess toothpaste out.   SODIUM FLUORIDE 5000 SENSITIVE 1.1-5 % GEL Take by mouth.   SSD 1 % cream Apply topically 2 (two) times daily.   STOOL SOFTENER 100 MG capsule 2 capsules daily.   SYNTHROID 112 MCG tablet TAKE 1 TABLET DAILY BEFORE BREAKFAST   triamcinolone cream (KENALOG) 0.1 %    triamterene-hydrochlorothiazide (DYAZIDE) 37.5-25 MG capsule Take 1 capsule by mouth daily.   vitamin B-12 (CYANOCOBALAMIN) 1000 MCG tablet Take 1,000 mcg by mouth daily.   Vitamin D, Ergocalciferol, (DRISDOL) 1.25 MG (50000 UT) CAPS capsule Take 50,000 Units by mouth once a week.   [DISCONTINUED] insulin aspart protamine - aspart (NOVOLOG MIX 70/30 FLEXPEN) (70-30) 100 UNIT/ML FlexPen Inject 70 Units into the skin 2 (two) times daily with a meal. (Patient taking differently: Inject 75 Units into the skin 2 (two) times daily with a meal.)   No facility-administered encounter medications on file as of 08/06/2021.    ALLERGIES: Allergies  Allergen Reactions   Lisinopril Swelling    Amoxicillin    Dapagliflozin    Ezetimibe    Hydroxychloroquine  Metronidazole    Naproxen    Pioglitazone    Plaquenil  [Hydroxychloroquine Sulfate]    Statins    Wound Dressings     VACCINATION STATUS:  There is no immunization history on file for this patient.  Diabetes She presents for her follow-up diabetic visit. She has type 2 diabetes mellitus. Onset time: She was diagnosed at approximate age of 61 years.  She has a previous history of gestational diabetes. Her disease course has been fluctuating. Hypoglycemia symptoms include tremors (intermittent whole body tremors). Pertinent negatives for hypoglycemia include no confusion, headaches, nervousness/anxiousness, pallor or seizures. Associated symptoms include fatigue, foot paresthesias and polyuria. Pertinent negatives for diabetes include no chest pain, no polydipsia, no polyphagia and no weight loss. There are no hypoglycemic complications. Symptoms are stable. Diabetic complications include peripheral neuropathy. Risk factors for coronary artery disease include diabetes mellitus, dyslipidemia, family history, hypertension, obesity, sedentary lifestyle, post-menopausal and tobacco exposure. Current diabetic treatment includes insulin injections and oral agent (dual therapy) (has been out of Ozempic for a while). She is compliant with treatment most of the time. Her weight is increasing steadily. She is following a generally healthy diet. When asked about meal planning, she reported none. She has had a previous visit with a dietitian. She never participates in exercise. Her home blood glucose trend is fluctuating dramatically. Her breakfast blood glucose range is generally >200 mg/dl. Her lunch blood glucose range is generally >200 mg/dl. Her dinner blood glucose range is generally >200 mg/dl. Her bedtime blood glucose range is generally >200 mg/dl. Her overall blood glucose range is >200 mg/dl. (She presents today, accompanied by her  daughter, with her CGM showing gross hyperglycemic overall but slowly improving over the last several weeks.  Her POCT A1c today is 11.1%.  She reports several oral infections as well as UTI since last visit.  She has been out of her Ozempic, needs to bring by the PAP forms to be filled out for another refill.  Analysis of her CGM shows TIR 10%, TAR 90% (65% in very high range), TBR 0% with a GMI of 9.9%.  She denies any significant hypoglycemia.) An ACE inhibitor/angiotensin II receptor blocker is contraindicated. She sees a podiatrist.Eye exam is current.  Thyroid Problem Presents for follow-up visit. Onset time: She is saying that she was diagnosed with hypothyroidism more than 20 years ago, has been on her current dose of levothyroxine 75 mcg for more than 5 years. Symptoms include fatigue, hair loss, tremors (intermittent whole body tremors) and weight gain. Patient reports no anxiety, cold intolerance, constipation, depressed mood, diarrhea, heat intolerance, palpitations or weight loss. (He brought in bags of hair which she has lost on any given day.  Her daughter also had to cut mats out of her hair.  She was wondering if this hair loss was related to her thyroid levels.) The symptoms have been stable. Her past medical history is significant for diabetes, hyperlipidemia, neuropathy and obesity. Risk factors include family history of hypothyroidism.  Hyperlipidemia This is a chronic problem. The current episode started more than 1 year ago. The problem is controlled. Recent lipid tests were reviewed and are normal. Exacerbating diseases include diabetes, hypothyroidism and obesity. Factors aggravating her hyperlipidemia include thiazides and fatty foods. Pertinent negatives include no chest pain, myalgias or shortness of breath. She is currently on no antihyperlipidemic treatment (statin intolerant. on welchol). Compliance problems include adherence to diet and adherence to exercise.  Risk factors for  coronary artery disease include diabetes mellitus,  dyslipidemia, a sedentary lifestyle, post-menopausal, family history, obesity and hypertension.    Review of systems  Constitutional: +steadily increasing body weight,  current Body mass index is 41.27 kg/m. 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27 kg/m   Wt Readings from Last 3 Encounters:  08/06/21 233 lb (105.7 kg)  05/06/21 228 lb (103.4 kg)  05/02/21 220 lb (99.8 kg)    BP Readings from Last 3 Encounters:  08/06/21 136/72  05/06/21 124/69  02/03/21 133/72     Physical Exam- Limited  Constitutional:  Body mass index is 41.27 kg/m. , not in acute distress, normal state of mind Eyes:  EOMI, no exophthalmos Neck: Supple Cardiovascular: RRR, no murmurs, rubs, or gallops, erythematous BLE, + 1+ pitting edema to BLE with blistering Respiratory: Adequate breathing efforts, no crackles, rales, rhonchi, or wheezing Musculoskeletal: no gross deformities, strength intact in all four extremities, no gross restriction of joint movements Skin: scattered fluid filled blisters noted to BLE Neurological: no tremor with outstretched  hands  Diabetic Foot Exam - Simple   Simple Foot Form Diabetic Foot exam was performed with the following findings: Yes 08/06/2021  4:27 PM  Visual Inspection See comments: Yes Sensation Testing See comments: Yes Pulse Check Posterior Tibialis and Dorsalis pulse intact bilaterally: Yes Comments Bilateral feet warm, red; right foot posterior 2nd toe there is a ?diabetic foot ulcer covered with callus     Recent Results (from the past 2160 hour(s))  HgB A1c     Status: Abnormal   Collection Time: 08/06/21  1:54 PM  Result Value Ref Range   Hemoglobin A1C     HbA1c POC (<> result, manual entry) 11.1 4.0 - 5.6 %   HbA1c, POC (prediabetic range)     HbA1c, POC (controlled diabetic range)    POCT UA - Microalbumin     Status: Normal   Collection Time: 08/06/21  1:54 PM  Result Value Ref Range   Microalbumin Ur, POC 10 mg/L   Creatinine, POC 200 mg/dL   Albumin/Creatinine Ratio, Urine, POC <30         Assessment & Plan:   1) Uncontrolled type 2 diabetes mellitus with hyperglycemia (HCC)  - Vidhi Delellis has currently uncontrolled symptomatic type 2 DM since 76 years of age.   She presents today, accompanied by her daughter, with her CGM showing gross hyperglycemic overall but slowly improving over the last several weeks.  Her POCT A1c today is 11.1%.  She reports several oral infections as well as UTI since last visit.  She has been out of her Ozempic, needs to bring by the PAP forms to be filled out for another refill.  Analysis of her CGM shows TIR 10%, TAR 90% (65% in very high range), TBR 0% with a GMI  of 9.9%.  She denies any significant hypoglycemia.  - Recent labs reviewed.  - I had a long discussion with her about the progressive nature of diabetes and the pathology behind its complications. -her diabetes is complicated by obesity/sedentary life, peripheral neuropathy and she remains at a high risk for more acute and chronic complications which include CAD, CVA, CKD,  retinopathy, and neuropathy. These are all discussed in detail with her.  - Nutritional counseling repeated at each appointment due to patients tendency to fall back in to old habits.  - The patient admits there is a room for improvement in their diet and drink choices. -  Suggestion is made for the patient to avoid simple carbohydrates from their diet including Cakes, Sweet Desserts / Pastries, Ice Cream, Soda (diet and regular), Sweet Tea, Candies, Chips, Cookies, Sweet Pastries, Store Bought Juices, Alcohol in Excess of 1-2 drinks a day, Artificial Sweeteners, Coffee Creamer, and "Sugar-free" Products. This will help patient to have stable blood glucose profile and potentially avoid unintended weight gain.   - I encouraged the patient to switch to unprocessed or minimally processed complex starch and increased protein intake (animal or plant source), fruits, and vegetables.   - Patient is advised to stick to a routine mealtimes to eat 3 meals a day and avoid unnecessary snacks (to snack only to correct hypoglycemia).  - I have approached her with the following individualized plan to manage  her diabetes and patient agrees:   - she will continue to need multiple daily injections of insulin in order for her to achieve and maintain control of diabetes to target.  Cost is a major concern for her.  I also discussed the potential to start her on insulin pump in the future which they will look into about cost.  -For simplicity reasons, we agreed that premixed insulin is the best choice.    -Based on her hyperglycemia, she is advised to increase her 70/30 insulin to 85 units BID with breakfast and supper if glucose is above 90 and she is eating.  She is also advised to restart her Ozempic 1 mg SQ weekly when able (pending authorization from PAP) and continue Metformin 1000 mg po twice daily with meals and Glipizide 10 mg XL daily with breakfast.    -She is encouraged to continue consistently monitoring  glucose at least 3 times per day, before injecting insulin (at breakfast and supper) and before bed using her CGM device.  She is encouraged to call the clinic if she has readings less than 70 or greater than 300 for 3 tests in a row.  - she is warned not to take insulin without proper monitoring per orders.  - Patient specific target  A1c;  LDL, HDL, Triglycerides, were discussed in detail.  2) Blood Pressure /Hypertension: Her blood pressure is controlled to target.  She is advised to continue Lasix 40 mg po daily, Metoprolol 25 mg po twice daily, and Triamterene-HCT 37.5-25 mg po daily.    3) Lipids/Hyperlipidemia:  Her most recent lipid panel from 08/06/20 shows uncontrolled LDL of 114 and elevated triglycerides of 195 (improving).  Insurance would not cover her Repatha but they are trying to get assistance with the cost through a grant with her PCP.  She is intolerant to statins and has Zetia listed as an allergy.  She sees PCP tomorrow and had labs done today.  Will send [place] a copy for our records.  4)  Weight/Diet:  Her Body mass index is 41.27  kg/m.-  clearly complicating her diabetes care.  She is a candidate for modest weight loss.  I discussed with her the fact that loss of 5 - 10% of her  current body weight will have the most impact on her diabetes management.  CDE Consult will be initiated . Exercise, and detailed carbohydrates information provided  -  detailed on discharge instructions.  5) Hypothyroidism-longstanding -There are no recent TFTs to review.  She is advised to stay on same dose of Synthroid 112 mcg po daily before breakfast.     - We discussed about the correct intake of her thyroid hormone, on empty stomach at fasting, with water, separated by at least 30 minutes from breakfast and other medications,  and separated by more than 4 hours from calcium, iron, multivitamins, acid reflux medications (PPIs). -Patient is made aware of the fact that thyroid hormone replacement  is needed for life, dose to be adjusted by periodic monitoring of thyroid function tests.  6) Chronic Care/Health Maintenance: -she is not on ACEI/ARB or Statin medications due to multiple allergies and is encouraged to initiate and continue to follow up with Ophthalmology, Dentist, Podiatrist at least yearly or according to recommendations, and advised to  stay away from smoking. I have recommended yearly flu vaccine and pneumonia vaccine at least every 5 years; moderate intensity exercise for up to 150 minutes weekly; and  sleep for at least 7 hours a day.  - she is advised to locate her PMD for primary care needs, as well as her other providers for optimal and coordinated care.     I spent 43 minutes in the care of the patient today including review of labs from CMP, Lipids, Thyroid Function, Hematology (current and previous including abstractions from other facilities); face-to-face time discussing  her blood glucose readings/logs, discussing hypoglycemia and hyperglycemia episodes and symptoms, medications doses, her options of short and long term treatment based on the latest standards of care / guidelines;  discussion about incorporating lifestyle medicine;  and documenting the encounter.    Please refer to Patient Instructions for Blood Glucose Monitoring and Insulin/Medications Dosing Guide"  in media tab for additional information. Please  also refer to " Patient Self Inventory" in the Media  tab for reviewed elements of pertinent patient history.  [name] participated in the discussions, expressed understanding, and voiced agreement with the above plans.  All questions were answered to her satisfaction. she is encouraged to contact clinic should she have any questions or concerns prior to her return visit.   Follow up plan: - Return in about 3 months (around 11/06/2021) for Diabetes F/U with A1c in office, Bring meter and logs, No previsit labs.  Ronny Bacon,  [facility] [address] [place], [place] [address] Phone: [phone] Fax: [phone]  08/06/2021, 4:35 PM

## 2021-08-08 ENCOUNTER — Telehealth: Payer: Self-pay | Admitting: Nurse Practitioner

## 2021-09-08 ENCOUNTER — Telehealth: Payer: Self-pay | Admitting: Nurse Practitioner

## 2021-09-08 NOTE — Telephone Encounter (Signed)
Pt left a VM stating there are some questions that need to be answered before her synthroid can be filled. Please call patient

## 2021-09-08 NOTE — Telephone Encounter (Signed)
Pt is having labs faxed over from Dr Mila Palmer.

## 2021-09-09 ENCOUNTER — Telehealth: Payer: Self-pay | Admitting: Nurse Practitioner

## 2021-09-09 NOTE — Telephone Encounter (Signed)
Patient has pt assistance ready for pick up-called pt, VM full

## 2021-09-15 ENCOUNTER — Telehealth: Payer: Self-pay | Admitting: Nurse Practitioner

## 2021-09-15 MED ORDER — LEVOTHYROXINE SODIUM 125 MCG PO TABS: 125.0000 ug | ORAL_TABLET | Freq: Every day | ORAL | 1 refills | Status: DC

## 2021-09-15 MED ORDER — NOVOLOG MIX 70/30 FLEXPEN (70-30) 100 UNIT/ML ~~LOC~~ SUPN: 85.0000 [IU] | PEN_INJECTOR | Freq: Two times a day (BID) | SUBCUTANEOUS | 3 refills | Status: DC

## 2021-09-15 NOTE — Telephone Encounter (Signed)
Did Pts dosage change?

## 2021-09-15 NOTE — Telephone Encounter (Signed)
Patient said she needs a new RX sent in for her thyroid medication with the new dose changed. Please send to [facility]

## 2021-09-15 NOTE — Telephone Encounter (Signed)
I believe I mentioned she needed an increase.  Must've forgotten to change it.  I sent in new Rx

## 2021-09-15 NOTE — Addendum Note (Signed)
Addended by: Dani Gobble on: 09/15/2021 09:39 AM   Modules accepted: Orders

## 2021-09-17 MED ORDER — LEVOTHYROXINE SODIUM 125 MCG PO TABS: 125.0000 ug | ORAL_TABLET | Freq: Every day | ORAL | 1 refills | Status: DC

## 2021-09-17 NOTE — Addendum Note (Signed)
Addended by: Dani Gobble on: 09/17/2021 12:47 PM   Modules accepted: Orders

## 2021-09-17 NOTE — Telephone Encounter (Signed)
This was suppose to go to [facility]. Can you change it

## 2021-09-17 NOTE — Telephone Encounter (Signed)
done

## 2021-09-24 ENCOUNTER — Encounter (INDEPENDENT_AMBULATORY_CARE_PROVIDER_SITE_OTHER): Payer: Self-pay

## 2021-10-06 ENCOUNTER — Other Ambulatory Visit: Payer: Self-pay | Admitting: Nurse Practitioner

## 2021-11-06 ENCOUNTER — Ambulatory Visit (INDEPENDENT_AMBULATORY_CARE_PROVIDER_SITE_OTHER): Payer: Medicare HMO | Admitting: Nurse Practitioner

## 2021-11-06 ENCOUNTER — Encounter: Payer: Self-pay | Admitting: Nurse Practitioner

## 2021-11-06 VITALS — BP 140/86 | HR 91 | Ht 63.5 in | Wt 220.4 lb

## 2021-11-06 DIAGNOSIS — E1165 Type 2 diabetes mellitus with hyperglycemia: Secondary | ICD-10-CM

## 2021-11-06 DIAGNOSIS — E782 Mixed hyperlipidemia: Secondary | ICD-10-CM | POA: Diagnosis not present

## 2021-11-06 DIAGNOSIS — I10 Essential (primary) hypertension: Secondary | ICD-10-CM | POA: Diagnosis not present

## 2021-11-06 DIAGNOSIS — E039 Hypothyroidism, unspecified: Secondary | ICD-10-CM | POA: Diagnosis not present

## 2021-11-06 LAB — POCT GLYCOSYLATED HEMOGLOBIN (HGB A1C): Hemoglobin A1C: 8.8 % — AB (ref 4.0–5.6)

## 2021-11-06 MED ORDER — NOVOLOG MIX 70/30 FLEXPEN (70-30) 100 UNIT/ML ~~LOC~~ SUPN: 75.0000 [IU] | PEN_INJECTOR | Freq: Two times a day (BID) | SUBCUTANEOUS | 3 refills | Status: AC

## 2021-11-06 NOTE — Progress Notes (Signed)
11/06/2021, 4:13 PM   Endocrinology follow-up note    Subjective:    Patient ID: Jaime Waller, female    DOB: February 23, 1945.  Jaime Waller is being seen in follow-up after she was seen in consultation for  management of currently uncontrolled symptomatic diabetes requested by  Jonathon Bellows, DO.   Past Medical History:  Diagnosis Date   Anxiety    Asthma    Fibromyalgia    Hypertension    Hypothyroidism    Mixed hyperlipidemia    Opioid abuse (HCC)    Osteopenia    Restless leg    Type II diabetes mellitus, uncontrolled    Venous insufficiency    Vitamin D deficiency      Social History   Socioeconomic History   Marital status: Married    Spouse name: Not on file   Number of children: 2   Years of education: Not on file   Highest education level: Not on file  Occupational History   Not on file  Tobacco Use   Smoking status: Former    Packs/day: 1.50    Types: Cigarettes    Quit date: 29    Years since quitting: 36.7   Smokeless tobacco: Never  Vaping Use   Vaping Use: Never used  Substance and Sexual Activity   Alcohol use: Never   Drug use: Never   Sexual activity: Not on file  Other Topics Concern   Not on file  Social History Narrative   Right Handed    Lives in a one story    Social Determinants of Health   Financial Resource Strain: Not on file  Food Insecurity: Not on file  Transportation Needs: Not on file  Physical Activity: Not on file  Stress: Not on file  Social Connections: Not on file    Family History  Problem Relation Age of Onset   Cancer Mother        Bladder Cancer & Breast Cancer   Diabetes Mellitus II Mother    Thyroid disease Mother    Hypertension Mother    CAD Mother    Kidney disease Mother    Heart failure Mother    Cancer Brother        Lung Cancer   Hypertension Brother    Diabetes Mellitus II Brother     Outpatient Encounter  Medications as of 11/06/2021  Medication Sig   Accu-Chek FastClix Lancets MISC USE AS DIRECTED TWICE DAILY   albuterol (VENTOLIN HFA) 108 (90 Base) MCG/ACT inhaler 4 (four) times daily as needed.   ALPRAZolam (XANAX) 0.5 MG tablet daily.   aspirin EC 81 MG tablet Take 81 mg by mouth daily.   azelastine (ASTELIN) 0.1 % nasal spray  USE 2 SPRAY(S) IN EACH NOSTRIL TWICE DAILY   b complex vitamins capsule Take 1 capsule by mouth daily.   benzonatate (TESSALON) 200 MG capsule daily as needed.   brimonidine (ALPHAGAN) 0.2 % ophthalmic solution    Cholecalciferol (VITAMIN D3) 1.25 MG (50000 UT) CAPS Take 1 capsule by mouth once a week.   dicyclomine (BENTYL) 10 MG capsule 10 mg 3 (three) times daily before meals.    DROPLET PEN  NEEDLES 31G X 5 MM MISC USE AS DIRECTED THREE TIMES DAILY   DULoxetine (CYMBALTA) 60 MG capsule Take by mouth.   famotidine (PEPCID) 20 MG tablet famotidine 20 mg tablet  TAKE 1 TABLET BY MOUTH TWICE DAILY   fluticasone (FLONASE) 50 MCG/ACT nasal spray 2 sprays daily.   furosemide (LASIX) 20 MG tablet Take 20 mg by mouth 2 (two) times daily.   gabapentin (NEURONTIN) 300 MG capsule at bedtime. Take one capsule  in the morning and two capsules at bedtime   gabapentin (NEURONTIN) 600 MG tablet Take 600 mg by mouth at bedtime.   glipiZIDE (GLUCOTROL XL) 10 MG 24 hr tablet TAKE 1 TABLET EVERY DAY WITH BREAKFAST   glucose blood (ACCU-CHEK GUIDE) test strip Use as instructed to monitor glucose 4 times daily.   Use as back up method for CGM   HYDROcodone-acetaminophen (NORCO/VICODIN) 5-325 MG tablet 2 (two) times daily.   lansoprazole (PREVACID) 30 MG capsule Take by mouth.   levocetirizine (XYZAL) 5 MG tablet Take 5 mg by mouth daily as needed.   levothyroxine (SYNTHROID) 125 MCG tablet Take 1 tablet (125 mcg total) by mouth daily before breakfast.   meloxicam (MOBIC) 7.5 MG tablet Take 7.5 mg by mouth daily.   metFORMIN (GLUCOPHAGE) 1000 MG tablet TAKE 1 TABLET TWICE DAILY    methocarbamol (ROBAXIN) 500 MG tablet Take by mouth.   metoCLOPramide (REGLAN) 5 MG tablet Take by mouth.   metoprolol tartrate (LOPRESSOR) 25 MG tablet Take 25 mg by mouth 2 (two) times daily with a meal.   nitrofurantoin (MACRODANTIN) 50 MG capsule Take 50 mg by mouth daily.   nystatin (MYCOSTATIN) 100000 UNIT/ML suspension Take 5 mLs by mouth 4 (four) times daily.   nystatin cream (MYCOSTATIN) 2 (two) times daily.   phenazopyridine (PYRIDIUM) 200 MG tablet Take 200 mg by mouth as needed.   pramipexole (MIRAPEX) 1 MG tablet Take 1 tablet by mouth 2 (two) times daily.    predniSONE (DELTASONE) 5 MG tablet    Semaglutide (OZEMPIC, 2 MG/DOSE, [place]) Inject 2 mg into the skin once a week.   Sod Fluoride-Potassium Nitrate (PREVIDENT 5000 ENAMEL PROTECT) 1.1-5 % GEL After you floss, brush for two full minutes at night ONLY. Spit excess toothpaste out.   SODIUM FLUORIDE 5000 SENSITIVE 1.1-5 % GEL Take by mouth.   SSD 1 % cream Apply topically 2 (two) times daily.   STOOL SOFTENER 100 MG capsule 2 capsules daily.   triamcinolone cream (KENALOG) 0.1 %    triamterene-hydrochlorothiazide (DYAZIDE) 37.5-25 MG capsule Take 1 capsule by mouth daily.   vitamin B-12 (CYANOCOBALAMIN) 1000 MCG tablet Take 1,000 mcg by mouth daily.   Vitamin D, Ergocalciferol, (DRISDOL) 1.25 MG (50000 UT) CAPS capsule Take 50,000 Units by mouth once a week.   [DISCONTINUED] insulin aspart protamine - aspart (NOVOLOG MIX 70/30 FLEXPEN) (70-30) 100 UNIT/ML FlexPen Inject 85 Units into the skin 2 (two) times daily with a meal.   ibuprofen (ADVIL) 200 MG tablet Take 200 mg by mouth every 6 (six) hours as needed.   insulin aspart protamine - aspart (NOVOLOG MIX 70/30 FLEXPEN) (70-30) 100 UNIT/ML FlexPen Inject 75 Units into the skin 2 (two) times daily with a meal.   PERCOCET 5-325 MG tablet 1 tablet PO Q 6 H PRN  - Pain (Patient not taking: Reported on 11/06/2021)   No facility-administered encounter medications on file as of  11/06/2021.    ALLERGIES: Allergies  Allergen Reactions   Lisinopril Swelling   Amoxicillin  Dapagliflozin    Ezetimibe    Hydroxychloroquine    Metronidazole    Naproxen    Pioglitazone    Plaquenil  [Hydroxychloroquine Sulfate]    Statins    Wound Dressings     VACCINATION STATUS:  There is no immunization history on file for this patient.  Diabetes She presents for her follow-up diabetic visit. She has type 2 diabetes mellitus. Onset time: She was diagnosed at approximate age of 76 years.  She has a previous history of gestational diabetes. Her disease course has been improving. Hypoglycemia symptoms include tremors (intermittent whole body tremors). Pertinent negatives for hypoglycemia include no confusion, headaches, nervousness/anxiousness, pallor or seizures. Associated symptoms include fatigue, foot paresthesias and polyuria. Pertinent negatives for diabetes include no chest pain, no polydipsia, no polyphagia and no weight loss. There are no hypoglycemic complications. Symptoms are improving. Diabetic complications include peripheral neuropathy. Risk factors for coronary artery disease include diabetes mellitus, dyslipidemia, family history, hypertension, obesity, sedentary lifestyle, post-menopausal and tobacco exposure. Current diabetic treatment includes insulin injections and oral agent (dual therapy) (and Ozempic). She is compliant with treatment most of the time. Her weight is decreasing steadily. She is following a generally healthy diet. When asked about meal planning, she reported none. She has had a previous visit with a dietitian. She never participates in exercise. Her home blood glucose trend is decreasing steadily. Her overall blood glucose range is >200 mg/dl. (She presents today with her CGM showing improved glycemic profile overall.  Her POCT A1c today is 8.8%, improving from last visit of 11.1%.  Analysis of her CGM shows TIR 34%, TAR 64%, TBR <2% with a GMI of  8.2%.  ) An ACE inhibitor/angiotensin II receptor blocker is contraindicated. She sees a podiatrist.Eye exam is current.  Thyroid Problem Presents for follow-up visit. Onset time: She is saying that she was diagnosed with hypothyroidism more than 20 years ago, has been on her current dose of levothyroxine 75 mcg for more than 5 years. Symptoms include fatigue, hair loss, tremors (intermittent whole body tremors) and weight gain. Patient reports no anxiety, cold intolerance, constipation, depressed mood, diarrhea, heat intolerance, palpitations or weight loss. (He brought in bags of hair which she has lost on any given day.  Her daughter also had to cut mats out of her hair.  She was wondering if this hair loss was related to her thyroid levels.) The symptoms have been stable. Her past medical history is significant for diabetes, hyperlipidemia, neuropathy and obesity. Risk factors include family history of hypothyroidism.  Hyperlipidemia This is a chronic problem. The current episode started more than 1 year ago. The problem is controlled. Recent lipid tests were reviewed and are normal. Exacerbating diseases include diabetes, hypothyroidism and obesity. Factors aggravating her hyperlipidemia include thiazides and fatty foods. Pertinent negatives include no chest pain, myalgias or shortness of breath. She is currently on no antihyperlipidemic treatment (statin intolerant. on welchol). Compliance problems include adherence to diet and adherence to exercise.  Risk factors for coronary artery disease include diabetes mellitus, dyslipidemia, a sedentary lifestyle, post-menopausal, family history, obesity and hypertension.    Review of systems  Constitutional: +decreasing body weight,  current Body mass index is 38.43 kg/m. 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 Cardiovascular: no chest pain, no shortness of breath, no palpitations, + leg swelling Respiratory:  no cough, no shortness of breath Gastrointestinal: no nausea/vomiting/diarrhea Musculoskeletal: no muscle/joint aches, disequilibrium with falls- also reports jerking movement intermittently Skin: + rashes to abdominal folds (undergoing surgical eval for panniculectomy), mouth and legs, blisters to BLE  Neurological: no tremors, no numbness, no tingling, no dizziness Psychiatric: no depression, no anxiety   Objective:    BP (!) 140/86 (BP Location: Right Leg, Patient Position: Sitting, Cuff Size: Large)   Pulse 91   Ht 5' 3.5" (1.613 m)   Wt 220 lb 6.4 oz (100 kg)   BMI 38.43 kg/m   Wt Readings from Last 3 Encounters:  11/06/21 220 lb 6.4 oz (100 kg)  08/06/21 233 lb (105.7 kg)  05/06/21 228 lb (103.4 kg)    BP Readings from Last 3 Encounters:  11/06/21 (!) 140/86  08/06/21 136/72  05/06/21 124/69     Physical Exam- Limited  Constitutional:  Body mass index is 38.43 kg/m. , not in acute distress, normal state of mind Eyes:  EOMI, no exophthalmos Neck: Supple Cardiovascular: RRR, no murmurs, rubs, or gallops, erythematous BLE, + 1+ pitting edema to BLE with blistering Respiratory: Adequate breathing efforts, no crackles, rales, rhonchi, or wheezing Musculoskeletal: no gross deformities, strength intact in all four extremities, no gross restriction of joint movements Skin: scattered fluid filled blisters noted to BLE Neurological: no tremor with outstretched hands   Diabetic Foot Exam - Simple   No data filed     Recent Results (from the past 2160 hour(s))  HgB A1c     Status: Abnormal   Collection Time: 11/06/21  2:24 PM  Result Value Ref Range   Hemoglobin A1C 8.8 (A) 4.0 - 5.6 %   HbA1c POC (<> result, manual entry)     HbA1c, POC (prediabetic range)     HbA1c, POC (controlled diabetic range)         Assessment & Plan:   1) Uncontrolled type 2 diabetes mellitus with hyperglycemia (HCC)  - Jaime Waller has currently uncontrolled symptomatic type 2 DM since 76 years of age.   She presents today with her CGM showing improved glycemic profile overall.  Her POCT A1c today is 8.8%, improving from last visit of 11.1%.  Analysis of her CGM shows TIR 34%, TAR 64%, TBR <2% with a GMI of 8.2%.    - Recent labs reviewed.  - I had a long discussion with her about the progressive nature of diabetes and the pathology behind its complications. -her diabetes is complicated by obesity/sedentary life, peripheral neuropathy and she remains at a high risk for more acute and chronic complications which include CAD, CVA, CKD, retinopathy, and neuropathy. These are all discussed in detail with her.  - Nutritional counseling repeated at each appointment due to patients tendency to fall back in to old habits.  - The patient admits there is a room for improvement in their diet and drink choices. -  Suggestion is made for the patient to avoid simple carbohydrates from their diet including Cakes, Sweet Desserts / Pastries, Ice Cream, Soda (diet and regular), Sweet Tea, Candies, Chips, Cookies, Sweet Pastries, Store Bought Juices, Alcohol in Excess of 1-2 drinks a day, Artificial Sweeteners, Coffee Creamer, and "Sugar-free" Products. This will help patient to have stable blood glucose profile and potentially avoid unintended weight gain.   - I encouraged the patient to switch to unprocessed or minimally processed complex starch and increased protein intake (animal or plant source), fruits, and vegetables.   - Patient is advised to stick to  a routine mealtimes to eat 3 meals a day and avoid unnecessary snacks (to snack only to correct hypoglycemia).  - I have approached her with the following individualized plan to manage  her diabetes and patient agrees:   - she will continue to need multiple daily  injections of insulin in order for her to achieve and maintain control of diabetes to target.  Cost is a major concern for her.  I also discussed the potential to start her on insulin pump in the future which they will look into about cost.  -For simplicity reasons, we agreed that premixed insulin is the best choice.    -She is advised to increase her Ozempic to 2 mg SQ weekly.  Once she gets the dosage increase from patient assistance program, she can lower her 70/30 to 75 units BID with meals if glucose is above 90 and she is eating.  She can continue Metformin 1000 mg po twice daily with meals and Glipizide 10 mg XL daily with breakfast.    -She is encouraged to continue consistently monitoring glucose at least 3 times per day, before injecting insulin (at breakfast and supper) and before bed using her CGM device.  She is encouraged to call the clinic if she has readings less than 70 or greater than 300 for 3 tests in a row.  - she is warned not to take insulin without proper monitoring per orders.  - Patient specific target  A1c;  LDL, HDL, Triglycerides, were discussed in detail.  2) Blood Pressure /Hypertension: Her blood pressure is controlled to target.  She is advised to continue Lasix 40 mg po daily, Metoprolol 25 mg po twice daily, and Triamterene-HCT 37.5-25 mg po daily.    3) Lipids/Hyperlipidemia:  Her most recent lipid panel from 08/06/21 shows uncontrolled LDL of 142 and elevated triglycerides of 188 (improving).  Insurance would not cover her Repatha but they are trying to get assistance with the cost through a grant with her PCP.  She is intolerant to statins and has Zetia listed as an allergy.  She sees PCP tomorrow and had labs done today.    4)  Weight/Diet:  Her Body mass index is 38.43 kg/m.-  clearly complicating her diabetes care.  She is a candidate for modest weight loss.  I discussed with her the fact that loss of 5 - 10% of her  current body weight will have the most  impact on her diabetes management.  CDE Consult will be initiated . Exercise, and detailed carbohydrates information provided  -  detailed on discharge instructions.  5) Hypothyroidism-longstanding -There are no recent TFTs to review.  She is advised to stay on same dose of Synthroid 125 mcg po daily before breakfast.  Will recheck TFTs prior to next visit.   - We discussed about the correct intake of her thyroid hormone, on empty stomach at fasting, with water, separated by at least 30 minutes from breakfast and other medications,  and separated by more than 4 hours from calcium, iron, multivitamins, acid reflux medications (PPIs). -Patient is made aware of the fact that thyroid hormone replacement is needed for life, dose to be adjusted by periodic monitoring of thyroid function tests.  6) Chronic Care/Health Maintenance: -she is not on ACEI/ARB or Statin medications due to multiple allergies and is encouraged to initiate and continue to follow up with Ophthalmology, Dentist, Podiatrist at least yearly or according to recommendations, and advised to  stay away from smoking. I have recommended yearly  flu vaccine and pneumonia vaccine at least every 5 years; moderate intensity exercise for up to 150 minutes weekly; and  sleep for at least 7 hours a day.  - she is advised to locate her PMD for primary care needs, as well as her other providers for optimal and coordinated care.      I spent 44 minutes in the care of the patient today including review of labs from CMP, Lipids, Thyroid Function, Hematology (current and previous including abstractions from other facilities); face-to-face time discussing  her blood glucose readings/logs, discussing hypoglycemia and hyperglycemia episodes and symptoms, medications doses, her options of short and long term treatment based on the latest standards of care / guidelines;  discussion about incorporating lifestyle medicine;  and documenting the encounter. Risk  reduction counseling performed per USPSTF guidelines to reduce obesity and cardiovascular risk factors.     Please refer to Patient Instructions for Blood Glucose Monitoring and Insulin/Medications Dosing Guide"  in media tab for additional information. Please  also refer to " Patient Self Inventory" in the Media  tab for reviewed elements of pertinent patient history.  Jaime Waller participated in the discussions, expressed understanding, and voiced agreement with the above plans.  All questions were answered to her satisfaction. she is encouraged to contact clinic should she have any questions or concerns prior to her return visit.   Follow up plan: - Return in about 3 months (around 02/05/2022) for Diabetes F/U with A1c in office, Thyroid follow up, Previsit labs, Bring meter and logs.  Ronny Bacon, [facility] [address] [place], [place] [address] Phone: [phone] Fax: [phone]  11/06/2021, 4:13 PM

## 2021-12-04 ENCOUNTER — Telehealth: Payer: Self-pay | Admitting: Nurse Practitioner

## 2021-12-04 NOTE — Telephone Encounter (Signed)
Called pt lvm stating her pt assistance is ready for p/u with pen needles

## 2021-12-09 NOTE — Telephone Encounter (Signed)
Pt's daughter picked up her insulin and her pen needles

## 2021-12-15 ENCOUNTER — Telehealth: Payer: Self-pay | Admitting: Nurse Practitioner

## 2021-12-15 NOTE — Telephone Encounter (Signed)
Patient called and asked if Dr Dorris Fetch and Jaime Waller signed those diabetic foot papers. Can you look and call pt

## 2021-12-15 NOTE — Telephone Encounter (Signed)
The foot exam notes have been faxed to the doctor in [place]. Patient was made aware.

## 2021-12-23 ENCOUNTER — Telehealth: Payer: Self-pay | Admitting: Nurse Practitioner

## 2021-12-23 NOTE — Telephone Encounter (Signed)
Pt has pt assistance ozempic in fridge. She is aware to pick it up

## 2022-02-05 ENCOUNTER — Ambulatory Visit: Payer: Medicare HMO | Admitting: Nurse Practitioner

## 2022-02-05 DIAGNOSIS — E039 Hypothyroidism, unspecified: Secondary | ICD-10-CM

## 2022-02-05 DIAGNOSIS — E1165 Type 2 diabetes mellitus with hyperglycemia: Secondary | ICD-10-CM

## 2022-02-05 DIAGNOSIS — E782 Mixed hyperlipidemia: Secondary | ICD-10-CM

## 2022-02-05 DIAGNOSIS — I10 Essential (primary) hypertension: Secondary | ICD-10-CM

## 2022-03-04 LAB — T4, FREE: Free T4: 1.17 ng/dL (ref 0.82–1.77)

## 2022-03-04 LAB — TSH: TSH: 3.82 u[IU]/mL (ref 0.450–4.500)

## 2022-03-05 ENCOUNTER — Encounter: Payer: Self-pay | Admitting: Nurse Practitioner

## 2022-03-05 ENCOUNTER — Ambulatory Visit: Payer: Medicare HMO | Admitting: Nurse Practitioner

## 2022-03-05 VITALS — BP 160/80 | HR 103 | Ht 63.5 in | Wt 222.6 lb

## 2022-03-05 DIAGNOSIS — E782 Mixed hyperlipidemia: Secondary | ICD-10-CM | POA: Diagnosis not present

## 2022-03-05 DIAGNOSIS — E039 Hypothyroidism, unspecified: Secondary | ICD-10-CM | POA: Diagnosis not present

## 2022-03-05 DIAGNOSIS — I10 Essential (primary) hypertension: Secondary | ICD-10-CM | POA: Diagnosis not present

## 2022-03-05 DIAGNOSIS — E1165 Type 2 diabetes mellitus with hyperglycemia: Secondary | ICD-10-CM | POA: Diagnosis not present

## 2022-03-05 LAB — POCT GLYCOSYLATED HEMOGLOBIN (HGB A1C): Hemoglobin A1C: 9.1 % — AB (ref 4.0–5.6)

## 2022-03-05 NOTE — Progress Notes (Signed)
03/05/2022, 4:17 PM   Endocrinology follow-up note    Subjective:    Patient ID: Jaime Waller, female    DOB: 11-Dec-1945.  Jaime Waller is being seen in follow-up after she was seen in consultation for  management of currently uncontrolled symptomatic diabetes requested by  Sherrilee Gilles, DO.   Past Medical History:  Diagnosis Date   Anxiety    Asthma    Fibromyalgia    Hypertension    Hypothyroidism    Mixed hyperlipidemia    Opioid abuse ([place])    Osteopenia    Restless leg    Type II diabetes mellitus, uncontrolled    Venous insufficiency    Vitamin D deficiency      Social History   Socioeconomic History   Marital status: Married    Spouse name: Not on file   Number of children: 2   Years of education: Not on file   Highest education level: Not on file  Occupational History   Not on file  Tobacco Use   Smoking status: Former    Packs/day: 1.50    Types: Cigarettes    Quit date: 6    Years since quitting: 37.0   Smokeless tobacco: Never  Vaping Use   Vaping Use: Never used  Substance and Sexual Activity   Alcohol use: Never   Drug use: Never   Sexual activity: Not on file  Other Topics Concern   Not on file  Social History Narrative   Right Handed    Lives in a one story    Social Determinants of Health   Financial Resource Strain: Not on file  Food Insecurity: Not on file  Transportation Needs: Not on file  Physical Activity: Not on file  Stress: Not on file  Social Connections: Not on file    Family History  Problem Relation Age of Onset   Cancer Mother        Bladder Cancer & Breast Cancer   Diabetes Mellitus II Mother    Thyroid disease Mother    Hypertension Mother    CAD Mother    Kidney disease Mother    Heart failure Mother    Cancer Brother        Lung Cancer   Hypertension Brother    Diabetes Mellitus II Brother     Outpatient Encounter  Medications as of 03/05/2022  Medication Sig   Accu-Chek FastClix Lancets MISC USE AS DIRECTED TWICE DAILY   albuterol (VENTOLIN HFA) 108 (90 Base) MCG/ACT inhaler 4 (four) times daily as needed.   ALPRAZolam (XANAX) 0.5 MG tablet daily.   aspirin EC 81 MG tablet Take 81 mg by mouth daily.   azelastine (ASTELIN) 0.1 % nasal spray  USE 2 SPRAY(S) IN EACH NOSTRIL TWICE DAILY   b complex vitamins capsule Take 1 capsule by mouth daily.   benzonatate (TESSALON) 200 MG capsule daily as needed.   brimonidine (ALPHAGAN) 0.2 % ophthalmic solution    Cholecalciferol (VITAMIN D3) 1.25 MG (50000 UT) CAPS Take 1 capsule by mouth once a week.   dicyclomine (BENTYL) 10 MG capsule 10 mg 3 (three) times daily before meals.    DROPLET PEN  NEEDLES 31G X 5 MM MISC USE AS DIRECTED THREE TIMES DAILY   DULoxetine (CYMBALTA) 60 MG capsule Take by mouth.   famotidine (PEPCID) 20 MG tablet famotidine 20 mg tablet  TAKE 1 TABLET BY MOUTH TWICE DAILY   fluticasone (FLONASE) 50 MCG/ACT nasal spray 2 sprays daily.   furosemide (LASIX) 20 MG tablet Take 20 mg by mouth 2 (two) times daily.   gabapentin (NEURONTIN) 300 MG capsule at bedtime. Take one capsule  in the morning and two capsules at bedtime   gabapentin (NEURONTIN) 600 MG tablet Take 600 mg by mouth at bedtime.   glipiZIDE (GLUCOTROL XL) 10 MG 24 hr tablet TAKE 1 TABLET EVERY DAY WITH BREAKFAST   glucose blood (ACCU-CHEK GUIDE) test strip Use as instructed to monitor glucose 4 times daily.   Use as back up method for CGM   HYDROcodone-acetaminophen (NORCO/VICODIN) 5-325 MG tablet 2 (two) times daily.   ibuprofen (ADVIL) 200 MG tablet Take 200 mg by mouth every 6 (six) hours as needed.   insulin aspart protamine - aspart (NOVOLOG MIX 70/30 FLEXPEN) (70-30) 100 UNIT/ML FlexPen Inject 75 Units into the skin 2 (two) times daily with a meal.   lansoprazole (PREVACID) 30 MG capsule Take by mouth.   levocetirizine (XYZAL) 5 MG tablet Take 5 mg by mouth daily as needed.    levothyroxine (SYNTHROID) 125 MCG tablet Take 1 tablet (125 mcg total) by mouth daily before breakfast.   meloxicam (MOBIC) 7.5 MG tablet Take 7.5 mg by mouth daily.   metFORMIN (GLUCOPHAGE) 1000 MG tablet TAKE 1 TABLET TWICE DAILY   methocarbamol (ROBAXIN) 500 MG tablet Take by mouth.   metoCLOPramide (REGLAN) 5 MG tablet Take by mouth.   metoprolol tartrate (LOPRESSOR) 25 MG tablet Take 25 mg by mouth 2 (two) times daily with a meal.   nitrofurantoin (MACRODANTIN) 50 MG capsule Take 50 mg by mouth daily.   nystatin (MYCOSTATIN) 100000 UNIT/ML suspension Take 5 mLs by mouth 4 (four) times daily.   nystatin cream (MYCOSTATIN) 2 (two) times daily.   phenazopyridine (PYRIDIUM) 200 MG tablet Take 200 mg by mouth as needed.   pramipexole (MIRAPEX) 1 MG tablet Take 1 tablet by mouth 2 (two) times daily.    predniSONE (DELTASONE) 5 MG tablet    Semaglutide (OZEMPIC, 2 MG/DOSE, [place]) Inject 2 mg into the skin once a week.   Sod Fluoride-Potassium Nitrate (PREVIDENT 5000 ENAMEL PROTECT) 1.1-5 % GEL After you floss, brush for two full minutes at night ONLY. Spit excess toothpaste out.   SODIUM FLUORIDE 5000 SENSITIVE 1.1-5 % GEL Take by mouth.   SSD 1 % cream Apply topically 2 (two) times daily.   STOOL SOFTENER 100 MG capsule 2 capsules daily.   triamcinolone cream (KENALOG) 0.1 %    triamterene-hydrochlorothiazide (DYAZIDE) 37.5-25 MG capsule Take 1 capsule by mouth daily.   vitamin B-12 (CYANOCOBALAMIN) 1000 MCG tablet Take 1,000 mcg by mouth daily.   Vitamin D, Ergocalciferol, (DRISDOL) 1.25 MG (50000 UT) CAPS capsule Take 50,000 Units by mouth once a week.   PERCOCET 5-325 MG tablet 1 tablet PO Q 6 H PRN  - Pain (Patient not taking: Reported on 11/06/2021)   No facility-administered encounter medications on file as of 03/05/2022.    ALLERGIES: Allergies  Allergen Reactions   Lisinopril Swelling   Amoxicillin    Dapagliflozin    Ezetimibe    Hydroxychloroquine    Metronidazole     Naproxen    Pioglitazone    Plaquenil  [Hydroxychloroquine  Sulfate]    Statins    Wound Dressings     VACCINATION STATUS:  There is no immunization history on file for this patient.  Diabetes She presents for her follow-up diabetic visit. She has type 2 diabetes mellitus. Onset time: She was diagnosed at approximate age of 6 years.  She has a previous history of gestational diabetes. Her disease course has been worsening. There are no hypoglycemic associated symptoms. Pertinent negatives for hypoglycemia include no confusion, headaches, nervousness/anxiousness, pallor or seizures. Tremors: intermittent whole body tremors.Associated symptoms include fatigue, foot paresthesias, polydipsia and polyuria. Pertinent negatives for diabetes include no chest pain, no polyphagia and no weight loss. There are no hypoglycemic complications. Symptoms are improving. Diabetic complications include peripheral neuropathy. Risk factors for coronary artery disease include diabetes mellitus, dyslipidemia, family history, hypertension, obesity, sedentary lifestyle, post-menopausal and tobacco exposure. Current diabetic treatment includes insulin injections and oral agent (dual therapy) (and Ozempic). She is compliant with treatment most of the time. Her weight is decreasing steadily. She is following a generally healthy diet. When asked about meal planning, she reported none. She has had a previous visit with a dietitian. She never participates in exercise. Her home blood glucose trend is increasing rapidly. Her overall blood glucose range is >200 mg/dl. (She presents today with her CGM showing gross hyperglycemia overall.  Her POCT A1c today is 9.1%, increasing from last visit of 8.8%.  She notes she did over indulge during the holidays but also had 2 steroid injections just yesterday for pain in hip and shoulder.  Analysis of her CGM shows TIR 8%, TAR 92% (61% in level 2 hyperglycemia), TBR 0% with a GMI of 9.8%.) An ACE  inhibitor/angiotensin II receptor blocker is contraindicated. She sees a podiatrist.Eye exam is current.  Thyroid Problem Presents for follow-up visit. Onset time: She is saying that she was diagnosed with hypothyroidism more than 20 years ago, has been on her current dose of levothyroxine 75 mcg for more than 5 years. Symptoms include fatigue, hair loss and weight gain. Patient reports no anxiety, cold intolerance, constipation, depressed mood, diarrhea, heat intolerance, palpitations or weight loss. Tremors: intermittent whole body tremors.(He brought in bags of hair which she has lost on any given day.  Her daughter also had to cut mats out of her hair.  She was wondering if this hair loss was related to her thyroid levels.) The symptoms have been stable. Her past medical history is significant for diabetes, hyperlipidemia, neuropathy and obesity. Risk factors include family history of hypothyroidism.  Hyperlipidemia This is a chronic problem. The current episode started more than 1 year ago. The problem is controlled. Recent lipid tests were reviewed and are normal. Exacerbating diseases include diabetes, hypothyroidism and obesity. Factors aggravating her hyperlipidemia include thiazides and fatty foods. Pertinent negatives include no chest pain, myalgias or shortness of breath. She is currently on no antihyperlipidemic treatment (statin intolerant. on welchol). Compliance problems include adherence to diet and adherence to exercise.  Risk factors for coronary artery disease include diabetes mellitus, dyslipidemia, a sedentary lifestyle, post-menopausal, family history, obesity and hypertension.    Review of systems  Constitutional: +minimally fluctuating body weight,  current Body mass index is 38.81 kg/m. 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 Cardiovascular: no chest pain, no shortness of breath, no palpitations, + leg swelling Respiratory:  no cough, no shortness of breath Gastrointestinal: no nausea/vomiting/diarrhea Musculoskeletal: + muscle/joint aches, disequilibrium with falls- also reports jerking movement intermittently Skin: + rashes to abdominal folds (undergoing surgical eval for panniculectomy), mouth and legs, blisters to BLE  Neurological: no tremors, no numbness, no tingling, no dizziness Psychiatric: no depression, no anxiety   Objective:    BP (!) 160/80 Comment: Manuel cuff  Pulse (!) 103   Ht 5' 3.5" (1.613 m)   Wt 222 lb 9.6 oz (101 kg)   BMI 38.81 kg/m   Wt Readings from Last 3 Encounters:  03/05/22 222 lb 9.6 oz (101 kg)  11/06/21 220 lb 6.4 oz (100 kg)  08/06/21 233 lb (105.7 kg)    BP Readings from Last 3 Encounters:  03/05/22 (!) 160/80  11/06/21 (!) 140/86  08/06/21 136/72     Physical Exam- Limited  Constitutional:  Body mass index is 38.81 kg/m. , not in acute distress, normal state of mind Eyes:  EOMI, no exophthalmos Musculoskeletal: no gross deformities, strength intact in all four extremities, no gross restriction of joint movements Skin: scattered fluid filled blisters noted to BLE Neurological: no tremor with outstretched hands   Diabetic Foot Exam - Simple   No data filed     Recent Results (from the past 2160 hour(s))  TSH     Status: None   Collection Time: 03/03/22  3:27 PM  Result Value Ref Range   TSH 3.820 0.450 - 4.500 uIU/mL  T4, free     Status: None   Collection Time: 03/03/22  3:27 PM  Result Value Ref Range   Free T4 1.17 0.82 - 1.77 ng/dL  HgB B5Z     Status: Abnormal   Collection Time: 03/05/22  3:43 PM  Result Value Ref Range   Hemoglobin A1C 9.1 (A) 4.0 - 5.6 %   HbA1c POC (<> result, manual entry)     HbA1c, POC (prediabetic range)     HbA1c, POC (controlled diabetic range)         Assessment & Plan:   1) Uncontrolled type 2 diabetes mellitus with hyperglycemia (HCC)  - Jaime Waller has currently uncontrolled symptomatic type 2 DM since 77 years of age.   She presents today with her CGM showing gross hyperglycemia overall.  Her POCT A1c today is 9.1%, increasing from last visit of 8.8%.  She notes she did over indulge during the holidays but also had 2 steroid injections just yesterday for pain in hip and shoulder.  Analysis of her CGM shows TIR 8%, TAR 92% (61% in level 2 hyperglycemia), TBR 0% with a GMI of 9.8%.  - Recent labs reviewed.  - I had a long discussion with her about the progressive nature of diabetes and the pathology behind its complications. -her diabetes is complicated by obesity/sedentary life, peripheral neuropathy and she remains at a high risk for more acute and chronic complications which include CAD, CVA, CKD, retinopathy, and neuropathy. These are all discussed in detail with her.  - Nutritional counseling repeated at each appointment due to patients tendency to fall back in to old habits.  - The patient admits there is a room for improvement in their diet and drink choices. -  Suggestion is made for the patient to avoid simple carbohydrates from their diet including Cakes, Sweet Desserts / Pastries, Ice Cream, Soda (diet and regular), Sweet Tea, Candies, Chips, Cookies, Sweet Pastries, Store Bought Juices, Alcohol in Excess of 1-2 drinks a day, Artificial Sweeteners, Coffee Creamer, and "Sugar-free"  Products. This will help patient to have stable blood glucose profile and potentially avoid unintended weight gain.   - I encouraged the patient to switch to unprocessed or minimally processed complex starch and increased protein intake (animal or plant source), fruits, and vegetables.   - Patient is advised to stick to a routine mealtimes to eat 3 meals a day and avoid unnecessary snacks (to snack only to correct hypoglycemia).                            - I have approached her with the following individualized plan to manage  her diabetes and patient agrees:   - she will continue to need multiple daily injections of insulin in order for her to achieve and maintain control of diabetes to target.  Cost is a major concern for her.  I also discussed the potential to start her on insulin pump in the future which they will look into about cost.  -For simplicity reasons, we agreed that premixed insulin is the best choice.    -She is advised to continue her Ozempic 2 mg SQ weekly.  She is advised to increase her 70/30 to 90 units BID with meals if glucose is above 90 and she is eating.  She can continue Metformin 1000 mg po twice daily with meals and Glipizide 10 mg XL daily with breakfast.    -She is encouraged to continue consistently monitoring glucose at least 3 times per day, before injecting insulin (at breakfast and supper) and before bed using her CGM device.  She is encouraged to call the clinic if she has readings less than 70 or greater than 300 for 3 tests in a row.  - she is warned not to take insulin without proper monitoring per orders.  - Patient specific target  A1c;  LDL, HDL, Triglycerides, were discussed in detail.  2) Blood Pressure /Hypertension: Her blood pressure is not controlled to target but reports she has been in significant amount of pain recently.  She is advised to continue Lasix 40 mg po daily, Metoprolol 25 mg po twice daily, and Triamterene-HCT 37.5-25 mg po daily.    3) Lipids/Hyperlipidemia:  Her most recent lipid panel from 08/06/21 shows uncontrolled LDL of 142 and elevated triglycerides of 188 (improving).  Insurance would not cover her Repatha but they are trying to get assistance with the cost through a grant with her PCP (She says she was approved for this-recently got the letter).  She is intolerant to statins and has Zetia listed as an allergy.  Will recheck lipid panel prior to next visit.    4)   Weight/Diet:  Her Body mass index is 38.81 kg/m.-  clearly complicating her diabetes care.  She is a candidate for modest weight loss.  I discussed with her the fact that loss of 5 - 10% of her  current body weight will have the most impact on her diabetes management.  CDE Consult will be initiated . Exercise, and detailed carbohydrates information provided  -  detailed on discharge instructions.  5) Hypothyroidism-longstanding -Her previsit TFTs are consistent with appropriate hormone replacement.  She is advised to stay on same dose of Synthroid 125 mcg po daily before breakfast.  Will recheck TFTs prior to next visit.   - We discussed about the correct intake of her thyroid hormone, on empty stomach at fasting, with water, separated by at least 30 minutes from breakfast and other medications,  and separated by more than 4 hours from calcium, iron, multivitamins, acid reflux medications (PPIs). -Patient is made aware of the fact that thyroid hormone replacement is needed for life, dose to be adjusted by periodic monitoring of thyroid function tests.  6) Chronic Care/Health Maintenance: -she is not on ACEI/ARB or Statin medications due to multiple allergies and is encouraged to initiate and continue to follow up with Ophthalmology, Dentist, Podiatrist at least yearly or according to recommendations, and advised to  stay away from smoking. I have recommended yearly flu vaccine and pneumonia vaccine at least every 5 years; moderate intensity exercise for up to 150 minutes weekly; and  sleep for at least 7 hours a day.  - she is advised to locate her PMD for primary care needs, as well as her other providers for optimal and coordinated care.     I spent 50 minutes in the care of the patient today including review of labs from [place], Lipids, Thyroid Function, Hematology (current and previous including abstractions from other facilities); face-to-face time discussing  her blood glucose readings/logs,  discussing hypoglycemia and hyperglycemia episodes and symptoms, medications doses, her options of short and long term treatment based on the latest standards of care / guidelines;  discussion about incorporating lifestyle medicine;  and documenting the encounter. Risk reduction counseling performed per USPSTF guidelines to reduce obesity and cardiovascular risk factors.     Please refer to Patient Instructions for Blood Glucose Monitoring and Insulin/Medications Dosing Guide"  in media tab for additional information. Please  also refer to " Patient Self Inventory" in the Media  tab for reviewed elements of pertinent patient history.  Jaime Waller participated in the discussions, expressed understanding, and voiced agreement with the above plans.  All questions were answered to her satisfaction. she is encouraged to contact clinic should she have any questions or concerns prior to her return visit.   Follow up plan: - Return in about 3 months (around 06/04/2022) for Diabetes F/U with A1c in office, Previsit labs, Bring meter and logs.  Jaime Waller, [facility] [address] [place], [place] [address] Phone: [phone] Fax: [phone]  03/05/2022, 4:17 PM

## 2022-04-23 ENCOUNTER — Other Ambulatory Visit: Payer: Self-pay | Admitting: Nurse Practitioner

## 2022-04-29 ENCOUNTER — Other Ambulatory Visit: Payer: Self-pay | Admitting: Nurse Practitioner

## 2022-05-18 ENCOUNTER — Telehealth: Payer: Self-pay

## 2022-05-18 NOTE — Telephone Encounter (Signed)
Tried to call pt but did not receive an answer and was unable to leave a message. 

## 2022-06-03 ENCOUNTER — Telehealth: Payer: Self-pay | Admitting: Nurse Practitioner

## 2022-06-03 NOTE — Telephone Encounter (Signed)
Called pt to advise her that this form has been faxed several times with Dr Isidoro Donning signature. Pt answered phone. I could hear her breathing on the on the other end of line but she would not speak/respond. Also spoke with representative from [employer] and he states that we are not the ones that she needs to be calling. She needs to contact her Podiatrist. He states everything on our end is taking care of.

## 2022-06-03 NOTE — Telephone Encounter (Signed)
Pt said to please have Dr Fransico Him sign off on her diabetic foot form and not [facility], she states that a NP can not sign off on this. Please Advise with patient and give her an update. Thank you

## 2022-06-04 ENCOUNTER — Ambulatory Visit: Payer: Medicare HMO | Admitting: Nurse Practitioner

## 2022-06-04 DIAGNOSIS — I10 Essential (primary) hypertension: Secondary | ICD-10-CM

## 2022-06-04 DIAGNOSIS — E782 Mixed hyperlipidemia: Secondary | ICD-10-CM

## 2022-06-04 DIAGNOSIS — E1165 Type 2 diabetes mellitus with hyperglycemia: Secondary | ICD-10-CM

## 2022-06-04 DIAGNOSIS — E039 Hypothyroidism, unspecified: Secondary | ICD-10-CM

## 2022-06-04 LAB — LIPID PANEL
Chol/HDL Ratio: 4.1 ratio (ref 0.0–4.4)
Cholesterol, Total: 199 mg/dL (ref 100–199)
HDL: 49 mg/dL (ref >39)
LDL Chol Calc (NIH): 114 mg/dL — ABNORMAL HIGH (ref 0–99)
Triglycerides: 207 mg/dL — ABNORMAL HIGH (ref 0–149)
VLDL Cholesterol Cal: 36 mg/dL (ref 5–40)

## 2022-06-04 LAB — COMPREHENSIVE METABOLIC PANEL
ALT: 25 IU/L (ref 0–32)
AST: 40 IU/L (ref 0–40)
Albumin/Globulin Ratio: 1.6 (ref 1.2–2.2)
Albumin: 4.1 g/dL (ref 3.8–4.8)
Alkaline Phosphatase: 97 IU/L (ref 44–121)
BUN/Creatinine Ratio: 19 (ref 12–28)
BUN: 10 mg/dL (ref 8–27)
Bilirubin Total: <0.2 mg/dL (ref 0.0–1.2)
CO2: 22 mmol/L (ref 20–29)
Calcium: 9.8 mg/dL (ref 8.7–10.3)
Chloride: 99 mmol/L (ref 96–106)
Creatinine, Ser: 0.54 mg/dL — ABNORMAL LOW (ref 0.57–1.00)
Globulin, Total: 2.6 g/dL (ref 1.5–4.5)
Glucose: 178 mg/dL — ABNORMAL HIGH (ref 70–99)
Potassium: 4.6 mmol/L (ref 3.5–5.2)
Sodium: 139 mmol/L (ref 134–144)
Total Protein: 6.7 g/dL (ref 6.0–8.5)
eGFR: 95 mL/min/{1.73_m2} (ref >59)

## 2022-06-04 LAB — VITAMIN D 25 HYDROXY (VIT D DEFICIENCY, FRACTURES): Vit D, 25-Hydroxy: 68 ng/mL (ref 30.0–100.0)

## 2022-06-04 LAB — T4, FREE: Free T4: 1.32 ng/dL (ref 0.82–1.77)

## 2022-06-04 LAB — TSH: TSH: 2.86 u[IU]/mL (ref 0.450–4.500)

## 2022-06-18 ENCOUNTER — Ambulatory Visit (INDEPENDENT_AMBULATORY_CARE_PROVIDER_SITE_OTHER): Payer: Medicare PPO | Admitting: Nurse Practitioner

## 2022-06-18 ENCOUNTER — Encounter: Payer: Self-pay | Admitting: Nurse Practitioner

## 2022-06-18 VITALS — BP 116/72 | HR 103 | Ht 63.5 in | Wt 212.4 lb

## 2022-06-18 DIAGNOSIS — L97519 Non-pressure chronic ulcer of other part of right foot with unspecified severity: Secondary | ICD-10-CM

## 2022-06-18 DIAGNOSIS — E782 Mixed hyperlipidemia: Secondary | ICD-10-CM | POA: Diagnosis not present

## 2022-06-18 DIAGNOSIS — E039 Hypothyroidism, unspecified: Secondary | ICD-10-CM | POA: Diagnosis not present

## 2022-06-18 DIAGNOSIS — E11621 Type 2 diabetes mellitus with foot ulcer: Secondary | ICD-10-CM | POA: Diagnosis not present

## 2022-06-18 DIAGNOSIS — E1165 Type 2 diabetes mellitus with hyperglycemia: Secondary | ICD-10-CM

## 2022-06-18 LAB — POCT GLYCOSYLATED HEMOGLOBIN (HGB A1C): Hemoglobin A1C: 8.4 % — AB (ref 4.0–5.6)

## 2022-06-18 NOTE — Progress Notes (Signed)
06/18/2022, 4:08 PM   Endocrinology follow-up note    Subjective:    Patient ID: Jaime Waller, female    DOB: 1945/05/23.  Jaime Waller is being seen in follow-up after she was seen in consultation for  management of currently uncontrolled symptomatic diabetes requested by  Jaime Bellows, DO.   Past Medical History:  Diagnosis Date   Anxiety    Asthma    Fibromyalgia    Hypertension    Hypothyroidism    Mixed hyperlipidemia    Opioid abuse (HCC)    Osteopenia    Restless leg    Type II diabetes mellitus, uncontrolled    Venous insufficiency    Vitamin D deficiency      Social History   Socioeconomic History   Marital status: Married    Spouse name: Not on file   Number of children: 2   Years of education: Not on file   Highest education level: Not on file  Occupational History   Not on file  Tobacco Use   Smoking status: Former    Packs/day: 1.5    Types: Cigarettes    Quit date: 62    Years since quitting: 37.3   Smokeless tobacco: Never  Vaping Use   Vaping Use: Never used  Substance and Sexual Activity   Alcohol use: Never   Drug use: Never   Sexual activity: Not on file  Other Topics Concern   Not on file  Social History Narrative   Right Handed    Lives in a one story    Social Determinants of Health   Financial Resource Strain: Not on file  Food Insecurity: Not on file  Transportation Needs: Not on file  Physical Activity: Not on file  Stress: Not on file  Social Connections: Not on file    Family History  Problem Relation Age of Onset   Cancer Mother        Bladder Cancer & Breast Cancer   Diabetes Mellitus II Mother    Thyroid disease Mother    Hypertension Mother    CAD Mother    Kidney disease Mother    Heart failure Mother    Cancer Brother        Lung Cancer   Hypertension Brother    Diabetes Mellitus II Brother     Outpatient Encounter  Medications as of 06/18/2022  Medication Sig   Accu-Chek FastClix Lancets MISC USE AS DIRECTED TWICE DAILY   albuterol (VENTOLIN HFA) 108 (90 Base) MCG/ACT inhaler 4 (four) times daily as needed.   ALPRAZolam (XANAX) 0.5 MG tablet daily.   azelastine (ASTELIN) 0.1 % nasal spray  USE 2 SPRAY(S) IN EACH NOSTRIL TWICE DAILY   b complex vitamins capsule Take 1 capsule by mouth daily.   benzonatate (TESSALON) 200 MG capsule daily as needed.   brimonidine (ALPHAGAN) 0.2 % ophthalmic solution    Cholecalciferol (VITAMIN D3) 1.25 MG (50000 UT) CAPS Take 1 capsule by mouth once a week.   dicyclomine (BENTYL) 10 MG capsule 10 mg 3 (three) times daily before meals.    DROPLET PEN NEEDLES 31G X 5 MM MISC USE AS DIRECTED THREE TIMES DAILY  DULoxetine (CYMBALTA) 60 MG capsule Take by mouth.   famotidine (PEPCID) 20 MG tablet famotidine 20 mg tablet  TAKE 1 TABLET BY MOUTH TWICE DAILY   fluticasone (FLONASE) 50 MCG/ACT nasal spray 2 sprays daily.   furosemide (LASIX) 20 MG tablet Take 20 mg by mouth 2 (two) times daily.   gabapentin (NEURONTIN) 300 MG capsule at bedtime. Take one capsule  in the morning and two capsules at bedtime   gabapentin (NEURONTIN) 600 MG tablet Take 600 mg by mouth at bedtime.   glipiZIDE (GLUCOTROL XL) 10 MG 24 hr tablet TAKE 1 TABLET EVERY DAY WITH BREAKFAST   glucose blood (ACCU-CHEK GUIDE) test strip Use as instructed to monitor glucose 4 times daily.   Use as back up method for CGM   HYDROcodone-acetaminophen (NORCO/VICODIN) 5-325 MG tablet 2 (two) times daily.   ibuprofen (ADVIL) 200 MG tablet Take 200 mg by mouth every 6 (six) hours as needed.   insulin aspart protamine - aspart (NOVOLOG MIX 70/30 FLEXPEN) (70-30) 100 UNIT/ML FlexPen Inject 75 Units into the skin 2 (two) times daily with a meal.   lansoprazole (PREVACID) 30 MG capsule Take by mouth.   levocetirizine (XYZAL) 5 MG tablet Take 5 mg by mouth daily as needed.   levothyroxine (SYNTHROID) 125 MCG tablet TAKE 1  TABLET (125 MCG TOTAL) DAILY BEFORE BREAKFAST.   meloxicam (MOBIC) 7.5 MG tablet Take 7.5 mg by mouth daily.   metFORMIN (GLUCOPHAGE) 1000 MG tablet TAKE 1 TABLET TWICE DAILY   methocarbamol (ROBAXIN) 500 MG tablet Take by mouth.   metoCLOPramide (REGLAN) 5 MG tablet Take by mouth.   metoprolol tartrate (LOPRESSOR) 25 MG tablet Take 25 mg by mouth 2 (two) times daily with a meal.   nitrofurantoin (MACRODANTIN) 50 MG capsule Take 50 mg by mouth daily.   nystatin (MYCOSTATIN) 100000 UNIT/ML suspension Take 5 mLs by mouth 4 (four) times daily.   nystatin cream (MYCOSTATIN) 2 (two) times daily.   phenazopyridine (PYRIDIUM) 200 MG tablet Take 200 mg by mouth as needed.   pramipexole (MIRAPEX) 1 MG tablet Take 1 tablet by mouth 2 (two) times daily.    predniSONE (DELTASONE) 5 MG tablet    Semaglutide (OZEMPIC, 2 MG/DOSE, [place]) Inject 2 mg into the skin once a week.   Sod Fluoride-Potassium Nitrate (PREVIDENT 5000 ENAMEL PROTECT) 1.1-5 % GEL After you floss, brush for two full minutes at night ONLY. Spit excess toothpaste out.   SODIUM FLUORIDE 5000 SENSITIVE 1.1-5 % GEL Take by mouth.   SSD 1 % cream Apply topically 2 (two) times daily.   STOOL SOFTENER 100 MG capsule 2 capsules daily.   triamcinolone cream (KENALOG) 0.1 %    triamterene-hydrochlorothiazide (DYAZIDE) 37.5-25 MG capsule Take 1 capsule by mouth daily.   vitamin B-12 (CYANOCOBALAMIN) 1000 MCG tablet Take 1,000 mcg by mouth daily.   Vitamin D, Ergocalciferol, (DRISDOL) 1.25 MG (50000 UT) CAPS capsule Take 50,000 Units by mouth once a week.   aspirin EC 81 MG tablet Take 81 mg by mouth daily. (Patient not taking: Reported on 06/18/2022)   PERCOCET 5-325 MG tablet 1 tablet PO Q 6 H PRN  - Pain (Patient not taking: Reported on 11/06/2021)   No facility-administered encounter medications on file as of 06/18/2022.    ALLERGIES: Allergies  Allergen Reactions   Lisinopril Swelling   Amoxicillin    Dapagliflozin    Ezetimibe     Hydroxychloroquine    Metronidazole    Naproxen    Pioglitazone  Plaquenil  [Hydroxychloroquine Sulfate]    Statins    Wound Dressings     VACCINATION STATUS:  There is no immunization history on file for this patient.  Diabetes She presents for her follow-up diabetic visit. She has type 2 diabetes mellitus. Onset time: She was diagnosed at approximate age of 75 years.  She has a previous history of gestational diabetes. Her disease course has been improving. Hypoglycemia symptoms include nervousness/anxiousness, sleepiness, sweats and tremors (intermittent whole body tremors). Pertinent negatives for hypoglycemia include no confusion, headaches, pallor or seizures. Associated symptoms include fatigue, foot paresthesias and weight loss. Pertinent negatives for diabetes include no chest pain, no polydipsia, no polyphagia and no polyuria. Hypoglycemia complications include nocturnal hypoglycemia (rare). Symptoms are improving. Diabetic complications include peripheral neuropathy. Risk factors for coronary artery disease include diabetes mellitus, dyslipidemia, family history, hypertension, obesity, sedentary lifestyle, post-menopausal and tobacco exposure. Current diabetic treatment includes insulin injections and oral agent (dual therapy) (and Ozempic). She is compliant with treatment most of the time. Her weight is decreasing steadily. She is following a generally healthy diet. When asked about meal planning, she reported none. She has had a previous visit with a dietitian. She never participates in exercise. Her home blood glucose trend is decreasing steadily. Her overall blood glucose range is >200 mg/dl. (She presents today, accompanied by her daughter, with her CGM showing improved, yet still above target glycemic profile.  Her POCT A1c today is 8.4%, improving from last visit of 9.1%.  She has had several other health issues since last visit including having dental procedures requiring  antibiotics and cellulitis.  She has lost approx 10 lbs since last visit as result of reduced intake as a result.  She was finally able to get her diabetic shoes.  Analysis of her CGM shows TIR 37%, TAR 62%, TBR 1% with a GMI of 7.9%.) An ACE inhibitor/angiotensin II receptor blocker is contraindicated. She sees a podiatrist.Eye exam is current.  Thyroid Problem Presents for follow-up visit. Onset time: She is saying that she was diagnosed with hypothyroidism more than 20 years ago, has been on her current dose of levothyroxine 75 mcg for more than 5 years. Symptoms include anxiety, fatigue, hair loss, tremors (intermittent whole body tremors), weight gain and weight loss. Patient reports no cold intolerance, constipation, depressed mood, diarrhea, heat intolerance or palpitations. (He brought in bags of hair which she has lost on any given day.  Her daughter also had to cut mats out of her hair.  She was wondering if this hair loss was related to her thyroid levels.) The symptoms have been stable. Her past medical history is significant for diabetes, hyperlipidemia, neuropathy and obesity. Risk factors include family history of hypothyroidism.  Hyperlipidemia This is a chronic problem. The current episode started more than 1 year ago. The problem is controlled. Recent lipid tests were reviewed and are normal. Exacerbating diseases include diabetes, hypothyroidism and obesity. Factors aggravating her hyperlipidemia include thiazides and fatty foods. Pertinent negatives include no chest pain, myalgias or shortness of breath. She is currently on no antihyperlipidemic treatment (statin intolerant. on welchol). Compliance problems include adherence to diet and adherence to exercise.  Risk factors for coronary artery disease include diabetes mellitus, dyslipidemia, a sedentary lifestyle, post-menopausal, family history, obesity and hypertension.    Review of systems  Constitutional: +decreasing body weight,   current Body mass index is 37.03 kg/m. , + fatigue, no subjective hyperthermia, no subjective hypothermia Eyes: no blurry vision, no xerophthalmia ENT: + sore  throat, no nodules palpated in throat, no dysphagia/odynophagia, no hoarseness, diagnosed with burning mouth syndrome, yeast on tongue, having all teeth pulled-currently on antibiotics Cardiovascular: no chest pain, no shortness of breath, no palpitations, + leg swelling, recent cellulitis infection Respiratory: no cough, no shortness of breath Gastrointestinal: no nausea/vomiting/diarrhea Musculoskeletal: + muscle/joint aches, disequilibrium with falls Skin: + rashes to abdominal folds (undergoing surgical eval for panniculectomy but needs to lose more weight and get A1c under control), mouth and legs, blisters to BLE  Neurological: no tremors, no numbness, no tingling, no dizziness Psychiatric: no depression, no anxiety   Objective:    BP 116/72 (BP Location: Right Arm, Patient Position: Sitting, Cuff Size: Large)   Pulse (!) 103   Ht 5' 3.5" (1.613 m)   Wt 212 lb 6.4 oz (96.3 kg)   BMI 37.03 kg/m   Wt Readings from Last 3 Encounters:  06/18/22 212 lb 6.4 oz (96.3 kg)  03/05/22 222 lb 9.6 oz (101 kg)  11/06/21 220 lb 6.4 oz (100 kg)    BP Readings from Last 3 Encounters:  06/18/22 116/72  03/05/22 (!) 160/80  11/06/21 (!) 140/86    Physical Exam- Limited  Constitutional:  Body mass index is 37.03 kg/m. , not in acute distress, normal state of mind Eyes:  EOMI, no exophthalmos Musculoskeletal: no gross deformities, strength intact in all four extremities, no gross restriction of joint movements Neurological: no tremor with outstretched hands   Diabetic Foot Exam - Simple   No data filed     Recent Results (from the past 2160 hour(s))  Comprehensive metabolic panel     Status: Abnormal   Collection Time: 06/03/22  3:52 PM  Result Value Ref Range   Glucose 178 (H) 70 - 99 mg/dL   BUN 10 8 - 27 mg/dL    Creatinine, Ser 1.61 (L) 0.57 - 1.00 mg/dL   eGFR 95 >09 UE/AVW/0.98   BUN/Creatinine Ratio 19 12 - 28   Sodium 139 134 - 144 mmol/L   Potassium 4.6 3.5 - 5.2 mmol/L   Chloride 99 96 - 106 mmol/L   CO2 22 20 - 29 mmol/L   Calcium 9.8 8.7 - 10.3 mg/dL   Total Protein 6.7 6.0 - 8.5 g/dL   Albumin 4.1 3.8 - 4.8 g/dL   Globulin, Total 2.6 1.5 - 4.5 g/dL   Albumin/Globulin Ratio 1.6 1.2 - 2.2   Bilirubin Total <0.2 0.0 - 1.2 mg/dL   Alkaline Phosphatase 97 44 - 121 IU/L   AST 40 0 - 40 IU/L   ALT 25 0 - 32 IU/L  Lipid panel     Status: Abnormal   Collection Time: 06/03/22  3:52 PM  Result Value Ref Range   Cholesterol, Total 199 100 - 199 mg/dL   Triglycerides 119 (H) 0 - 149 mg/dL   HDL 49 >14 mg/dL   VLDL Cholesterol Cal 36 5 - 40 mg/dL   LDL Chol Calc (NIH) 782 (H) 0 - 99 mg/dL   Chol/HDL Ratio 4.1 0.0 - 4.4 ratio    Comment:                                   T. Chol/HDL Ratio  Men  Women                               1/2 Avg.Risk  3.4    3.3                                   Avg.Risk  5.0    4.4                                2X Avg.Risk  9.6    7.1                                3X Avg.Risk 23.4   11.0   TSH     Status: None   Collection Time: 06/03/22  3:52 PM  Result Value Ref Range   TSH 2.860 0.450 - 4.500 uIU/mL  T4, free     Status: None   Collection Time: 06/03/22  3:52 PM  Result Value Ref Range   Free T4 1.32 0.82 - 1.77 ng/dL  VITAMIN D 25 Hydroxy (Vit-D Deficiency, Fractures)     Status: None   Collection Time: 06/03/22  3:52 PM  Result Value Ref Range   Vit D, 25-Hydroxy 68.0 30.0 - 100.0 ng/mL    Comment: Vitamin D deficiency has been defined by the Institute of Medicine and an Endocrine Society practice guideline as a level of serum 25-OH vitamin D less than 20 ng/mL (1,2). The Endocrine Society went on to further define vitamin D insufficiency as a level between 21 and 29 ng/mL (2). 1. IOM (Institute of  Medicine). 2010. Dietary reference    intakes for calcium and D. [place]: The    [employer]. 2. Holick MF, Binkley [place], Bischoff-Ferrari HA, et al.    Evaluation, treatment, and prevention of vitamin D    deficiency: an Endocrine Society clinical practice    guideline. JCEM. 2011 Jul; 96(7):1911-30.   HgB A1c     Status: Abnormal   Collection Time: 06/18/22  3:34 PM  Result Value Ref Range   Hemoglobin A1C 8.4 (A) 4.0 - 5.6 %   HbA1c POC (<> result, manual entry)     HbA1c, POC (prediabetic range)     HbA1c, POC (controlled diabetic range)        Assessment & Plan:   1) Uncontrolled type 2 diabetes mellitus with hyperglycemia (HCC)  - Devanie Galanti has currently uncontrolled symptomatic type 2 DM since 77 years of age.   She presents today, accompanied by her daughter, with her CGM showing improved, yet still above target glycemic profile.  Her POCT A1c today is 8.4%, improving from last visit of 9.1%.  She has had several other health issues since last visit including having dental procedures requiring antibiotics and cellulitis.  She has lost approx 10 lbs since last visit as result of reduced intake as a result.  She was finally able to get her diabetic shoes.  Analysis of her CGM shows TIR 37%, TAR 62%, TBR 1% with a GMI of 7.9%.  - Recent labs reviewed.  - I had a long discussion with her about the progressive nature of diabetes and the pathology behind its complications. -her diabetes is complicated by obesity/sedentary life, peripheral neuropathy and  she remains at a high risk for more acute and chronic complications which include CAD, CVA, CKD, retinopathy, and neuropathy. These are all discussed in detail with her.  - Nutritional counseling repeated at each appointment due to patients tendency to fall back in to old habits.  - The patient admits there is a room for improvement in their diet and drink choices. -  Suggestion is made for the patient to  avoid simple carbohydrates from their diet including Cakes, Sweet Desserts / Pastries, Ice Cream, Soda (diet and regular), Sweet Tea, Candies, Chips, Cookies, Sweet Pastries, Store Bought Juices, Alcohol in Excess of 1-2 drinks a day, Artificial Sweeteners, Coffee Creamer, and "Sugar-free" Products. This will help patient to have stable blood glucose profile and potentially avoid unintended weight gain.   - I encouraged the patient to switch to unprocessed or minimally processed complex starch and increased protein intake (animal or plant source), fruits, and vegetables.   - Patient is advised to stick to a routine mealtimes to eat 3 meals a day and avoid unnecessary snacks (to snack only to correct hypoglycemia).                         - I have approached her with the following individualized plan to manage  her diabetes and patient agrees:   - she will continue to need multiple daily injections of insulin in order for her to achieve and maintain control of diabetes to target.  Cost is a major concern for her.  I also discussed the potential to start her on insulin pump in the future which they will look into about cost.  -For simplicity reasons, we agreed that premixed insulin is the best choice.    -She is advised to continue her Ozempic 2 mg SQ weekly.  She is advised to lower her 70/30 to 80 units BID with meals if glucose is above 90 and she is eating due to hypoglycemia related to reduced food intake.  She can continue Metformin 1000 mg po twice daily with meals and Glipizide 10 mg XL daily with breakfast (may reduce this if she continues to have hypoglycemia).    -She is encouraged to continue consistently monitoring glucose at least 3 times per day, before injecting insulin (at breakfast and supper) and before bed using her CGM device.  She is encouraged to call the clinic if she has readings less than 70 or greater than 300 for 3 tests in a row.  - she is warned not to take insulin without  proper monitoring per orders.  - Patient specific target  A1c;  LDL, HDL, Triglycerides, were discussed in detail.  2) Blood Pressure /Hypertension: Her blood pressure is controlled to target.  She is advised to continue Lasix 40 mg po daily, Metoprolol 25 mg po twice daily, and Triamterene-HCT 37.5-25 mg po daily.    3) Lipids/Hyperlipidemia:  Her most recent lipid panel from 06/03/22 shows uncontrolled LDL of 114 and elevated triglycerides of 207 (improving).  She is working on PAP for Repatha as she is intolerant to statins and has Zetia listed as an allergy.     4)  Weight/Diet:  Her Body mass index is 37.03 kg/m.-  clearly complicating her diabetes care.  She is a candidate for modest weight loss.  I discussed with her the fact that loss of 5 - 10% of her  current body weight will have the most impact on her diabetes management.  CDE Consult  will be initiated . Exercise, and detailed carbohydrates information provided  -  detailed on discharge instructions.  5) Hypothyroidism-longstanding -Her previsit TFTs are consistent with appropriate hormone replacement.  She is advised to stay on same dose of Synthroid 125 mcg po daily before breakfast.     - We discussed about the correct intake of her thyroid hormone, on empty stomach at fasting, with water, separated by at least 30 minutes from breakfast and other medications,  and separated by more than 4 hours from calcium, iron, multivitamins, acid reflux medications (PPIs). -Patient is made aware of the fact that thyroid hormone replacement is needed for life, dose to be adjusted by periodic monitoring of thyroid function tests.  6) Chronic Care/Health Maintenance: -she is not on ACEI/ARB or Statin medications due to multiple allergies and is encouraged to initiate and continue to follow up with Ophthalmology, Dentist, Podiatrist at least yearly or according to recommendations, and advised to  stay away from smoking. I have recommended yearly  flu vaccine and pneumonia vaccine at least every 5 years; moderate intensity exercise for up to 150 minutes weekly; and  sleep for at least 7 hours a day.  - she is advised to locate her PMD for primary care needs, as well as her other providers for optimal and coordinated care.     I spent  46  minutes in the care of the patient today including review of labs from CMP, Lipids, Thyroid Function, Hematology (current and previous including abstractions from other facilities); face-to-face time discussing  her blood glucose readings/logs, discussing hypoglycemia and hyperglycemia episodes and symptoms, medications doses, her options of short and long term treatment based on the latest standards of care / guidelines;  discussion about incorporating lifestyle medicine;  and documenting the encounter. Risk reduction counseling performed per USPSTF guidelines to reduce obesity and cardiovascular risk factors.     Please refer to Patient Instructions for Blood Glucose Monitoring and Insulin/Medications Dosing Guide"  in media tab for additional information. Please  also refer to " Patient Self Inventory" in the Media  tab for reviewed elements of pertinent patient history.  [name] participated in the discussions, expressed understanding, and voiced agreement with the above plans.  All questions were answered to her satisfaction. she is encouraged to contact clinic should she have any questions or concerns prior to her return visit.   Follow up plan: - Return in about 3 months (around 09/18/2022) for Diabetes F/U with A1c in office, No previsit labs, Bring meter and logs.  Ronny Bacon, [facility] [address] [place], [place] [address] Phone: [phone] Fax: [phone]  06/18/2022, 4:08 PM

## 2022-07-06 ENCOUNTER — Other Ambulatory Visit: Payer: Self-pay | Admitting: Nurse Practitioner

## 2022-09-24 ENCOUNTER — Ambulatory Visit: Payer: Medicare PPO | Admitting: Nurse Practitioner

## 2022-09-24 DIAGNOSIS — Z7984 Long term (current) use of oral hypoglycemic drugs: Secondary | ICD-10-CM

## 2022-09-24 DIAGNOSIS — I10 Essential (primary) hypertension: Secondary | ICD-10-CM

## 2022-09-24 DIAGNOSIS — Z7985 Long-term (current) use of injectable non-insulin antidiabetic drugs: Secondary | ICD-10-CM

## 2022-09-24 DIAGNOSIS — E1165 Type 2 diabetes mellitus with hyperglycemia: Secondary | ICD-10-CM

## 2022-09-24 DIAGNOSIS — E782 Mixed hyperlipidemia: Secondary | ICD-10-CM

## 2022-09-24 DIAGNOSIS — E039 Hypothyroidism, unspecified: Secondary | ICD-10-CM

## 2022-09-24 DIAGNOSIS — Z794 Long term (current) use of insulin: Secondary | ICD-10-CM

## 2022-09-29 ENCOUNTER — Ambulatory Visit (INDEPENDENT_AMBULATORY_CARE_PROVIDER_SITE_OTHER): Payer: Medicare PPO | Admitting: Nurse Practitioner

## 2022-09-29 ENCOUNTER — Encounter: Payer: Self-pay | Admitting: Nurse Practitioner

## 2022-09-29 VITALS — BP 117/67 | HR 84 | Ht 63.5 in | Wt 215.8 lb

## 2022-09-29 DIAGNOSIS — Z794 Long term (current) use of insulin: Secondary | ICD-10-CM | POA: Diagnosis not present

## 2022-09-29 DIAGNOSIS — E1165 Type 2 diabetes mellitus with hyperglycemia: Secondary | ICD-10-CM

## 2022-09-29 DIAGNOSIS — E039 Hypothyroidism, unspecified: Secondary | ICD-10-CM

## 2022-09-29 DIAGNOSIS — Z7984 Long term (current) use of oral hypoglycemic drugs: Secondary | ICD-10-CM | POA: Diagnosis not present

## 2022-09-29 DIAGNOSIS — Z7985 Long-term (current) use of injectable non-insulin antidiabetic drugs: Secondary | ICD-10-CM

## 2022-09-29 LAB — POCT GLYCOSYLATED HEMOGLOBIN (HGB A1C): Hemoglobin A1C: 8.8 % — AB (ref 4.0–5.6)

## 2022-09-29 MED ORDER — DEXCOM G7 SENSOR MISC: 1.0000 | 3 refills | Status: DC

## 2022-09-29 MED ORDER — DEXCOM G7 RECEIVER DEVI: 1.0000 | Freq: Once | 0 refills | Status: AC

## 2022-09-29 NOTE — Progress Notes (Signed)
09/29/2022, 4:35 PM   Endocrinology follow-up note    Subjective:    Patient ID: Jaime Waller, female    DOB: [birth year].  Jaime Waller is being seen in follow-up after she was seen in consultation for  management of currently uncontrolled symptomatic diabetes requested by  Jonathon Bellows, DO.   Past Medical History:  Diagnosis Date   Anxiety    Asthma    Fibromyalgia    Hypertension    Hypothyroidism    Mixed hyperlipidemia    Opioid abuse (HCC)    Osteopenia    Restless leg    Type II diabetes mellitus, uncontrolled    Venous insufficiency    Vitamin D deficiency      Social History   Socioeconomic History   Marital status: Married    Spouse name: Not on file   Number of children: 2   Years of education: Not on file   Highest education level: Not on file  Occupational History   Not on file  Tobacco Use   Smoking status: Former    Current packs/day: 0.00    Types: Cigarettes    Quit date: 33    Years since quitting: 37.6   Smokeless tobacco: Never  Vaping Use   Vaping status: Never Used  Substance and Sexual Activity   Alcohol use: Never   Drug use: Never   Sexual activity: Not on file  Other Topics Concern   Not on file  Social History Narrative   Right Handed    Lives in a one story    Social Determinants of Health   Financial Resource Strain: Not on file  Food Insecurity: Not on file  Transportation Needs: Not on file  Physical Activity: Not on file  Stress: Not on file  Social Connections: Not on file    Family History  Problem Relation Age of Onset   Cancer Mother        Bladder Cancer & Breast Cancer   Diabetes Mellitus II Mother    Thyroid disease Mother    Hypertension Mother    CAD Mother    Kidney disease Mother    Heart failure Mother    Cancer Brother        Lung Cancer   Hypertension Brother    Diabetes Mellitus II Brother     Outpatient  Encounter Medications as of 09/29/2022  Medication Sig   Accu-Chek FastClix Lancets MISC USE AS DIRECTED TWICE DAILY   albuterol (VENTOLIN HFA) 108 (90 Base) MCG/ACT inhaler 4 (four) times daily as needed.   ALPRAZolam (XANAX) 0.5 MG tablet daily.   azelastine (ASTELIN) 0.1 % nasal spray  USE 2 SPRAY(S) IN EACH NOSTRIL TWICE DAILY   b complex vitamins capsule Take 1 capsule by mouth daily.   benzonatate (TESSALON) 200 MG capsule daily as needed.   brimonidine (ALPHAGAN) 0.2 % ophthalmic solution    Cholecalciferol (VITAMIN D3) 1.25 MG (50000 UT) CAPS Take 1 capsule by mouth once a week.   Continuous Glucose Receiver (DEXCOM G7 RECEIVER) DEVI 1 Device by Does not apply route once for 1 dose.   Continuous Glucose Sensor (DEXCOM G7 SENSOR) MISC Inject 1 Application  into the skin as directed. Change sensor every 10 days as directed.   dicyclomine (BENTYL) 10 MG capsule 10 mg 3 (three) times daily before meals.    DROPLET PEN NEEDLES 31G X 5 MM MISC USE AS DIRECTED THREE TIMES DAILY   DULoxetine (CYMBALTA) 60 MG capsule Take by mouth.   famotidine (PEPCID) 20 MG tablet famotidine 20 mg tablet  TAKE 1 TABLET BY MOUTH TWICE DAILY   fluticasone (FLONASE) 50 MCG/ACT nasal spray 2 sprays daily.   furosemide (LASIX) 20 MG tablet Take 20 mg by mouth 2 (two) times daily.   gabapentin (NEURONTIN) 300 MG capsule at bedtime. Take one capsule  in the morning and two capsules at bedtime   gabapentin (NEURONTIN) 600 MG tablet Take 600 mg by mouth at bedtime.   glipiZIDE (GLUCOTROL XL) 10 MG 24 hr tablet TAKE 1 TABLET EVERY DAY WITH BREAKFAST   glucose blood (ACCU-CHEK GUIDE) test strip Use as instructed to monitor glucose 4 times daily.   Use as back up method for CGM   HYDROcodone-acetaminophen (NORCO/VICODIN) 5-325 MG tablet 2 (two) times daily.   ibuprofen (ADVIL) 200 MG tablet Take 200 mg by mouth every 6 (six) hours as needed.   insulin aspart protamine - aspart (NOVOLOG MIX 70/30 FLEXPEN) (70-30) 100  UNIT/ML FlexPen Inject 75 Units into the skin 2 (two) times daily with a meal.   lansoprazole (PREVACID) 30 MG capsule Take by mouth.   levocetirizine (XYZAL) 5 MG tablet Take 5 mg by mouth daily as needed.   levothyroxine (SYNTHROID) 125 MCG tablet TAKE 1 TABLET (125 MCG TOTAL) DAILY BEFORE BREAKFAST.   meloxicam (MOBIC) 7.5 MG tablet Take 7.5 mg by mouth daily.   metFORMIN (GLUCOPHAGE) 1000 MG tablet TAKE 1 TABLET TWICE DAILY   methocarbamol (ROBAXIN) 500 MG tablet Take by mouth.   metoCLOPramide (REGLAN) 5 MG tablet Take by mouth.   metoprolol tartrate (LOPRESSOR) 25 MG tablet Take 25 mg by mouth 2 (two) times daily with a meal.   nitrofurantoin (MACRODANTIN) 50 MG capsule Take 50 mg by mouth daily.   nystatin (MYCOSTATIN) 100000 UNIT/ML suspension Take 5 mLs by mouth 4 (four) times daily.   nystatin cream (MYCOSTATIN) 2 (two) times daily.   phenazopyridine (PYRIDIUM) 200 MG tablet Take 200 mg by mouth as needed.   pramipexole (MIRAPEX) 1 MG tablet Take 1 tablet by mouth 2 (two) times daily.    predniSONE (DELTASONE) 5 MG tablet    Semaglutide (OZEMPIC, 2 MG/DOSE, [place]) Inject 2 mg into the skin once a week.   Sod Fluoride-Potassium Nitrate (PREVIDENT 5000 ENAMEL PROTECT) 1.1-5 % GEL After you floss, brush for two full minutes at night ONLY. Spit excess toothpaste out.   SODIUM FLUORIDE 5000 SENSITIVE 1.1-5 % GEL Take by mouth.   SSD 1 % cream Apply topically 2 (two) times daily.   STOOL SOFTENER 100 MG capsule 2 capsules daily.   triamcinolone cream (KENALOG) 0.1 %    triamterene-hydrochlorothiazide (DYAZIDE) 37.5-25 MG capsule Take 1 capsule by mouth daily.   vitamin B-12 (CYANOCOBALAMIN) 1000 MCG tablet Take 1,000 mcg by mouth daily.   Vitamin D, Ergocalciferol, (DRISDOL) 1.25 MG (50000 UT) CAPS capsule Take 50,000 Units by mouth once a week.   aspirin EC 81 MG tablet Take 81 mg by mouth daily. (Patient not taking: Reported on 06/18/2022)   PERCOCET 5-325 MG tablet 1 tablet PO Q 6 H PRN  -  Pain (Patient not taking: Reported on 11/06/2021)   No facility-administered encounter medications on  file as of 09/29/2022.    ALLERGIES: Allergies  Allergen Reactions   Lisinopril Swelling   Amoxicillin    Dapagliflozin    Ezetimibe    Hydroxychloroquine    Metronidazole    Naproxen    Pioglitazone    Plaquenil  [Hydroxychloroquine Sulfate]    Statins    Wound Dressings     VACCINATION STATUS:  There is no immunization history on file for this patient.  Diabetes She presents for her follow-up diabetic visit. She has type 2 diabetes mellitus. Onset time: She was diagnosed at approximate age of 54 years.  She has a previous history of gestational diabetes. Her disease course has been worsening. Hypoglycemia symptoms include nervousness/anxiousness, sleepiness, sweats and tremors (intermittent whole body tremors). Pertinent negatives for hypoglycemia include no confusion, headaches, pallor or seizures. Associated symptoms include fatigue and foot paresthesias. Pertinent negatives for diabetes include no chest pain, no polydipsia, no polyphagia, no polyuria and no weight loss. Hypoglycemia complications include nocturnal hypoglycemia (rare). Symptoms are stable. Diabetic complications include peripheral neuropathy. Risk factors for coronary artery disease include diabetes mellitus, dyslipidemia, family history, hypertension, obesity, sedentary lifestyle, post-menopausal and tobacco exposure. Current diabetic treatment includes insulin injections and oral agent (dual therapy) (and Ozempic). She is compliant with treatment most of the time. Her weight is fluctuating minimally. She is following a generally healthy diet. When asked about meal planning, she reported none. She has had a previous visit with a dietitian. She never participates in exercise. Her home blood glucose trend is fluctuating dramatically. Her overall blood glucose range is >200 mg/dl. (She presents today, accompanied by her  daughter, with her CGM showing above target glycemic profile overall with drastic fluctuations.  Her POCT A1c today is 8.8%, increasing from last visit of 8.4%.  She has been out of Ozempic for about a month now, brought in patient assistance forms for that and Repatha with her today.  Analysis of her CGM shows TIR 23%, TAR 77%, TBR 0% with a GMI of 8.6%.) An ACE inhibitor/angiotensin II receptor blocker is contraindicated. She sees a podiatrist.Eye exam is current.  Thyroid Problem Presents for follow-up visit. Onset time: She is saying that she was diagnosed with hypothyroidism more than 20 years ago, has been on her current dose of levothyroxine 75 mcg for more than 5 years. Symptoms include anxiety, fatigue, hair loss, tremors (intermittent whole body tremors) and weight gain. Patient reports no cold intolerance, constipation, depressed mood, diarrhea, heat intolerance, palpitations or weight loss. (He brought in bags of hair which she has lost on any given day.  Her daughter also had to cut mats out of her hair.  She was wondering if this hair loss was related to her thyroid levels.) The symptoms have been stable. Her past medical history is significant for diabetes, hyperlipidemia, neuropathy and obesity. Risk factors include family history of hypothyroidism.  Hyperlipidemia This is a chronic problem. The current episode started more than 1 year ago. The problem is controlled. Recent lipid tests were reviewed and are normal. Exacerbating diseases include diabetes, hypothyroidism and obesity. Factors aggravating her hyperlipidemia include thiazides and fatty foods. Pertinent negatives include no chest pain, myalgias or shortness of breath. She is currently on no antihyperlipidemic treatment (statin intolerant. on welchol). Compliance problems include adherence to diet and adherence to exercise.  Risk factors for coronary artery disease include diabetes mellitus, dyslipidemia, a sedentary lifestyle,  post-menopausal, family history, obesity and hypertension.    Review of systems  Constitutional: +stable body weight,  current Body mass index is 37.63 kg/m. 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, needs teeth extractions but needs A1c under control first Cardiovascular: no chest pain, no shortness of breath, no palpitations, + leg swelling Respiratory: no cough, no shortness of breath Gastrointestinal: no nausea/vomiting/diarrhea Musculoskeletal: + muscle/joint aches, disequilibrium with falls Skin: + rashes to abdominal folds (undergoing surgical eval for panniculectomy but needs to lose more weight and get A1c under control), mouth and legs, blisters to BLE  Neurological: no tremors, no numbness, no tingling, no dizziness Psychiatric: no depression, no anxiety   Objective:    BP 117/67 (BP Location: Left Arm, Patient Position: Sitting, Cuff Size: Normal)   Pulse 84   Ht 5' 3.5" (1.613 m)   Wt 215 lb 12.8 oz (97.9 kg)   BMI 37.63 kg/m   Wt Readings from Last 3 Encounters:  09/29/22 215 lb 12.8 oz (97.9 kg)  06/18/22 212 lb 6.4 oz (96.3 kg)  03/05/22 222 lb 9.6 oz (101 kg)    BP Readings from Last 3 Encounters:  09/29/22 117/67  06/18/22 116/72  03/05/22 (!) 160/80     Physical Exam- Limited  Constitutional:  Body mass index is 37.63 kg/m. , not in acute distress, normal state of mind Eyes:  EOMI, no exophthalmos Musculoskeletal: no gross deformities, strength intact in all four extremities, no gross restriction of joint movements Skin:  no rashes, no hyperemia Neurological: no tremor with outstretched hands   Diabetic Foot Exam - Simple   Simple Foot Form Visual Inspection See comments: Yes Sensation Testing Intact to touch and monofilament testing  bilaterally: Yes Pulse Check Posterior Tibialis and Dorsalis pulse intact bilaterally: Yes Comments Callus to right plantar foot; decreased sensation to monofilament tool on left foot     Recent Results (from the past 2160 hour(s))  HgB A1c     Status: Abnormal   Collection Time: 09/29/22  3:29 PM  Result Value Ref Range   Hemoglobin A1C 8.8 (A) 4.0 - 5.6 %   HbA1c POC (<> result, manual entry)     HbA1c, POC (prediabetic range)     HbA1c, POC (controlled diabetic range)         Assessment & Plan:   1) Uncontrolled type 2 diabetes mellitus with hyperglycemia (HCC)  - Jaime Waller has currently uncontrolled symptomatic type 2 DM since 77 years of age.   She presents today, accompanied by her daughter, with her CGM showing above target glycemic profile overall with drastic fluctuations.  Her POCT A1c today is 8.8%, increasing from last visit of 8.4%.  She has been out of Ozempic for about a month now, brought in patient assistance forms for that and Repatha with her today.  Analysis of her CGM shows TIR 23%, TAR 77%, TBR 0% with a GMI of 8.6%.  - Recent labs reviewed.  - I had a long discussion with her about the progressive nature of diabetes and the pathology behind its complications. -her diabetes is complicated by obesity/sedentary life, peripheral neuropathy and she remains at a high risk for more acute and chronic complications which include CAD, CVA, CKD, retinopathy, and neuropathy. These are all discussed in detail with her.  - Nutritional counseling repeated at each appointment due to patients tendency to fall back in to old habits.  - The patient admits there is  a room for improvement in their diet and drink choices. -  Suggestion is made for the patient to avoid simple carbohydrates from their diet including Cakes, Sweet Desserts / Pastries, Ice Cream, Soda (diet and regular), Sweet Tea, Candies, Chips, Cookies, Sweet Pastries, Store Bought Juices, Alcohol in Excess of  1-2 drinks a day, Artificial Sweeteners, Coffee Creamer, and "Sugar-free" Products. This will help patient to have stable blood glucose profile and potentially avoid unintended weight gain.   - I encouraged the patient to switch to unprocessed or minimally processed complex starch and increased protein intake (animal or plant source), fruits, and vegetables.   - Patient is advised to stick to a routine mealtimes to eat 3 meals a day and avoid unnecessary snacks (to snack only to correct hypoglycemia).                     - I have approached her with the following individualized plan to manage  her diabetes and patient agrees:   -she will continue to need multiple daily injections of insulin in order for her to achieve and maintain control of diabetes to target.  Cost is a major concern for her.  I also discussed the potential to start her on insulin pump in the future which they will look into about cost.  -For simplicity reasons, we agreed that premixed insulin is the best choice.    -She is advised to restart Ozempic 0.25 mg SQ weekly x 2 weeks, then increase to 0.5 mg SQ weekly (will request that PAP be for 1 mg dose so we can titrate back to max dose which she tolerated well).  She is advised to lower her 70/30 to 80 units BID with meals if glucose is above 90 and she is eating due to hypoglycemia related to reduced food intake.  She can continue Metformin 1000 mg po twice daily with meals and Glipizide 10 mg XL daily with breakfast (may reduce this if she continues to have hypoglycemia).    -She is encouraged to continue consistently monitoring glucose at least 3 times per day, before injecting insulin (at breakfast and supper) and before bed using her CGM device.  She is encouraged to call the clinic if she has readings less than 70 or greater than 300 for 3 tests in a row.  - she is warned not to take insulin without proper monitoring per orders.  - Patient specific target  A1c;  LDL, HDL,  Triglycerides, were discussed in detail.  2) Blood Pressure /Hypertension: Her blood pressure is controlled to target.  She is advised to continue Lasix 40 mg po daily, Metoprolol 25 mg po twice daily, and Triamterene-HCT 37.5-25 mg po daily.    3) Lipids/Hyperlipidemia:  Her most recent lipid panel from 06/03/22 shows uncontrolled LDL of 114 and elevated triglycerides of 207 (improving).  She is working on PAP for Repatha as she is intolerant to statins (brought in paperwork with her today for [place] to fill out and send) and has Zetia listed as an allergy.     4)  Weight/Diet:  Her Body mass index is 37.63 kg/m.-  clearly complicating her diabetes care.  She is a candidate for modest weight loss.  I discussed with her the fact that loss of 5 - 10% of her  current body weight will have the most impact on her diabetes management.  CDE Consult will be initiated . Exercise, and detailed carbohydrates information provided  -  detailed on discharge  instructions.  5) Hypothyroidism-longstanding -There are no recent TFTs to review.  She is advised to stay on same dose of Synthroid 125 mcg po daily before breakfast.  Will recheck TFTs on subsequent visits and adjust if needed.   - We discussed about the correct intake of her thyroid hormone, on empty stomach at fasting, with water, separated by at least 30 minutes from breakfast and other medications,  and separated by more than 4 hours from calcium, iron, multivitamins, acid reflux medications (PPIs). -Patient is made aware of the fact that thyroid hormone replacement is needed for life, dose to be adjusted by periodic monitoring of thyroid function tests.  6) Chronic Care/Health Maintenance: -she is not on ACEI/ARB or Statin medications due to multiple allergies and is encouraged to initiate and continue to follow up with Ophthalmology, Dentist, Podiatrist at least yearly or according to recommendations, and advised to  stay away from smoking. I have  recommended yearly flu vaccine and pneumonia vaccine at least every 5 years; moderate intensity exercise for up to 150 minutes weekly; and  sleep for at least 7 hours a day.  - she is advised to locate her PMD for primary care needs, as well as her other providers for optimal and coordinated care.  I did not recommend proceeding with tooth extractions at this time due to A1c being elevated.  It is in part due to steroid injections she gets every 3 months.  Once she gets back on the Ozempic, I suspect her A1c will improve and we can re-evaluate at that time.   I spent  43  minutes in the care of the patient today including review of labs from CMP, Lipids, Thyroid Function, Hematology (current and previous including abstractions from other facilities); face-to-face time discussing  her blood glucose readings/logs, discussing hypoglycemia and hyperglycemia episodes and symptoms, medications doses, her options of short and long term treatment based on the latest standards of care / guidelines;  discussion about incorporating lifestyle medicine;  and documenting the encounter. Risk reduction counseling performed per USPSTF guidelines to reduce obesity and cardiovascular risk factors.     Please refer to Patient Instructions for Blood Glucose Monitoring and Insulin/Medications Dosing Guide"  in media tab for additional information. Please  also refer to " Patient Self Inventory" in the Media  tab for reviewed elements of pertinent patient history.  Jaime Waller participated in the discussions, expressed understanding, and voiced agreement with the above plans.  All questions were answered to her satisfaction. she is encouraged to contact clinic should she have any questions or concerns prior to her return visit.   Follow up plan: - Return in about 3 months (around 12/30/2022) for Diabetes F/U with A1c in office, Thyroid follow up, Previsit labs, Bring meter and logs.  Ronny Bacon, [facility]  [facility] [address] [place], [place] [address] Phone: [phone] Fax: [phone]  09/29/2022, 4:35 PM

## 2022-10-13 ENCOUNTER — Telehealth: Payer: Self-pay | Admitting: Nurse Practitioner

## 2022-10-13 NOTE — Telephone Encounter (Signed)
Made pt's daughter aware her pt assistance is here for pickup

## 2022-10-22 NOTE — Telephone Encounter (Signed)
Pt picked up pt assistance ozempic.

## 2022-10-28 ENCOUNTER — Telehealth: Payer: Self-pay | Admitting: Nurse Practitioner

## 2022-10-28 NOTE — Telephone Encounter (Signed)
Called pt to let her know that her pt assistance was here. No answer VM full

## 2022-11-06 ENCOUNTER — Other Ambulatory Visit: Payer: Self-pay | Admitting: *Deleted

## 2022-11-06 DIAGNOSIS — E039 Hypothyroidism, unspecified: Secondary | ICD-10-CM

## 2022-11-06 MED ORDER — LEVOTHYROXINE SODIUM 125 MCG PO TABS: 125.0000 ug | ORAL_TABLET | Freq: Every day | ORAL | 1 refills | Status: DC

## 2022-11-25 LAB — COMPREHENSIVE METABOLIC PANEL
ALT: 21 [IU]/L (ref 0–32)
AST: 33 [IU]/L (ref 0–40)
Albumin: 4.1 g/dL (ref 3.8–4.8)
Alkaline Phosphatase: 83 [IU]/L (ref 44–121)
BUN/Creatinine Ratio: 14 (ref 12–28)
BUN: 10 mg/dL (ref 8–27)
Bilirubin Total: <0.2 mg/dL (ref 0.0–1.2)
CO2: 28 mmol/L (ref 20–29)
Calcium: 9.7 mg/dL (ref 8.7–10.3)
Chloride: 98 mmol/L (ref 96–106)
Creatinine, Ser: 0.73 mg/dL (ref 0.57–1.00)
Globulin, Total: 2.4 g/dL (ref 1.5–4.5)
Glucose: 90 mg/dL (ref 70–99)
Potassium: 4.3 mmol/L (ref 3.5–5.2)
Sodium: 142 mmol/L (ref 134–144)
Total Protein: 6.5 g/dL (ref 6.0–8.5)
eGFR: 84 mL/min/{1.73_m2} (ref >59)

## 2022-11-25 LAB — T4, FREE: Free T4: 1.23 ng/dL (ref 0.82–1.77)

## 2022-11-25 LAB — TSH: TSH: 0.81 u[IU]/mL (ref 0.450–4.500)

## 2022-12-15 ENCOUNTER — Telehealth: Payer: Self-pay | Admitting: Nurse Practitioner

## 2022-12-15 NOTE — Telephone Encounter (Signed)
Called and let pt know that pt assistance of Ozempic was ready for pick up, could not leave message as mailbox was full

## 2022-12-24 ENCOUNTER — Telehealth: Payer: Self-pay | Admitting: Nurse Practitioner

## 2022-12-24 NOTE — Telephone Encounter (Signed)
Pt called stating she needs labs for her next appt on 11/13. Called pt. No answer. VM full. Pt had labs on 10/8

## 2022-12-30 ENCOUNTER — Ambulatory Visit (INDEPENDENT_AMBULATORY_CARE_PROVIDER_SITE_OTHER): Payer: Medicare PPO | Admitting: Nurse Practitioner

## 2022-12-30 ENCOUNTER — Encounter: Payer: Self-pay | Admitting: Nurse Practitioner

## 2022-12-30 VITALS — BP 142/72 | HR 99 | Ht 63.5 in | Wt 211.8 lb

## 2022-12-30 DIAGNOSIS — Z794 Long term (current) use of insulin: Secondary | ICD-10-CM

## 2022-12-30 DIAGNOSIS — E1165 Type 2 diabetes mellitus with hyperglycemia: Secondary | ICD-10-CM | POA: Diagnosis not present

## 2022-12-30 DIAGNOSIS — Z7984 Long term (current) use of oral hypoglycemic drugs: Secondary | ICD-10-CM

## 2022-12-30 DIAGNOSIS — E039 Hypothyroidism, unspecified: Secondary | ICD-10-CM | POA: Diagnosis not present

## 2022-12-30 DIAGNOSIS — I10 Essential (primary) hypertension: Secondary | ICD-10-CM

## 2022-12-30 DIAGNOSIS — Z7985 Long-term (current) use of injectable non-insulin antidiabetic drugs: Secondary | ICD-10-CM

## 2022-12-30 DIAGNOSIS — E782 Mixed hyperlipidemia: Secondary | ICD-10-CM

## 2022-12-30 LAB — POCT GLYCOSYLATED HEMOGLOBIN (HGB A1C): Hemoglobin A1C: 8.2 % — AB (ref 4.0–5.6)

## 2022-12-30 NOTE — Progress Notes (Signed)
12/30/2022, 3:52 PM   Endocrinology follow-up note    Subjective:    Patient ID: Jaime Waller, female    DOB: Jun 24, 1945.  Jaime Waller is being seen in follow-up after she was seen in consultation for  management of currently uncontrolled symptomatic diabetes requested by  Jonathon Bellows, DO.   Past Medical History:  Diagnosis Date   Anxiety    Asthma    Fibromyalgia    Hypertension    Hypothyroidism    Mixed hyperlipidemia    Opioid abuse (HCC)    Osteopenia    Restless leg    Type II diabetes mellitus, uncontrolled    Venous insufficiency    Vitamin D deficiency      Social History   Socioeconomic History   Marital status: Married    Spouse name: Not on file   Number of children: 2   Years of education: Not on file   Highest education level: Not on file  Occupational History   Not on file  Tobacco Use   Smoking status: Former    Current packs/day: 0.00    Types: Cigarettes    Quit date: 7    Years since quitting: 37.8   Smokeless tobacco: Never  Vaping Use   Vaping status: Never Used  Substance and Sexual Activity   Alcohol use: Never   Drug use: Never   Sexual activity: Not on file  Other Topics Concern   Not on file  Social History Narrative   Right Handed    Lives in a one story    Social Determinants of Health   Financial Resource Strain: Not on file  Food Insecurity: Not on file  Transportation Needs: Not on file  Physical Activity: Not on file  Stress: Not on file  Social Connections: Not on file    Family History  Problem Relation Age of Onset   Cancer Mother        Bladder Cancer & Breast Cancer   Diabetes Mellitus II Mother    Thyroid disease Mother    Hypertension Mother    CAD Mother    Kidney disease Mother    Heart failure Mother    Cancer Brother        Lung Cancer   Hypertension Brother    Diabetes Mellitus II Brother     Outpatient  Encounter Medications as of 12/30/2022  Medication Sig   Accu-Chek FastClix Lancets MISC USE AS DIRECTED TWICE DAILY   albuterol (VENTOLIN HFA) 108 (90 Base) MCG/ACT inhaler 4 (four) times daily as needed.   ALPRAZolam (XANAX) 0.5 MG tablet daily.   azelastine (ASTELIN) 0.1 % nasal spray  USE 2 SPRAY(S) IN EACH NOSTRIL TWICE DAILY   b complex vitamins capsule Take 1 capsule by mouth daily.   benzonatate (TESSALON) 200 MG capsule daily as needed.   brimonidine (ALPHAGAN) 0.2 % ophthalmic solution    Cholecalciferol (VITAMIN D3) 1.25 MG (50000 UT) CAPS Take 1 capsule by mouth once a week.   Continuous Glucose Sensor (DEXCOM G7 SENSOR) MISC Inject 1 Application into the skin as directed. Change sensor every 10 days as directed.   dicyclomine (BENTYL) 10 MG capsule 10  mg 3 (three) times daily before meals.    DROPLET PEN NEEDLES 31G X 5 MM MISC USE AS DIRECTED THREE TIMES DAILY   DULoxetine (CYMBALTA) 60 MG capsule Take by mouth.   famotidine (PEPCID) 20 MG tablet famotidine 20 mg tablet  TAKE 1 TABLET BY MOUTH TWICE DAILY   fluticasone (FLONASE) 50 MCG/ACT nasal spray 2 sprays daily.   furosemide (LASIX) 20 MG tablet Take 20 mg by mouth 2 (two) times daily.   gabapentin (NEURONTIN) 300 MG capsule at bedtime. Take one capsule  in the morning and two capsules at bedtime   gabapentin (NEURONTIN) 600 MG tablet Take 600 mg by mouth at bedtime.   glipiZIDE (GLUCOTROL XL) 10 MG 24 hr tablet TAKE 1 TABLET EVERY DAY WITH BREAKFAST   glucose blood (ACCU-CHEK GUIDE) test strip Use as instructed to monitor glucose 4 times daily.   Use as back up method for CGM   HYDROcodone-acetaminophen (NORCO/VICODIN) 5-325 MG tablet 2 (two) times daily.   ibuprofen (ADVIL) 200 MG tablet Take 200 mg by mouth every 6 (six) hours as needed.   insulin aspart protamine - aspart (NOVOLOG MIX 70/30 FLEXPEN) (70-30) 100 UNIT/ML FlexPen Inject 75 Units into the skin 2 (two) times daily with a meal.   lansoprazole (PREVACID) 30  MG capsule Take by mouth.   levocetirizine (XYZAL) 5 MG tablet Take 5 mg by mouth daily as needed.   levothyroxine (SYNTHROID) 125 MCG tablet Take 1 tablet (125 mcg total) by mouth daily before breakfast.   meloxicam (MOBIC) 7.5 MG tablet Take 7.5 mg by mouth daily.   metFORMIN (GLUCOPHAGE) 1000 MG tablet TAKE 1 TABLET TWICE DAILY   methocarbamol (ROBAXIN) 500 MG tablet Take by mouth.   metoCLOPramide (REGLAN) 5 MG tablet Take by mouth.   metoprolol tartrate (LOPRESSOR) 25 MG tablet Take 25 mg by mouth 2 (two) times daily with a meal.   nitrofurantoin (MACRODANTIN) 50 MG capsule Take 50 mg by mouth daily.   nystatin (MYCOSTATIN) 100000 UNIT/ML suspension Take 5 mLs by mouth 4 (four) times daily.   nystatin cream (MYCOSTATIN) 2 (two) times daily.   PERCOCET 5-325 MG tablet    phenazopyridine (PYRIDIUM) 200 MG tablet Take 200 mg by mouth as needed.   pramipexole (MIRAPEX) 1 MG tablet Take 1 tablet by mouth 2 (two) times daily.    predniSONE (DELTASONE) 5 MG tablet    Semaglutide (OZEMPIC, 2 MG/DOSE, [place]) Inject 2 mg into the skin once a week.   Sod Fluoride-Potassium Nitrate (PREVIDENT 5000 ENAMEL PROTECT) 1.1-5 % GEL After you floss, brush for two full minutes at night ONLY. Spit excess toothpaste out.   SODIUM FLUORIDE 5000 SENSITIVE 1.1-5 % GEL Take by mouth.   SSD 1 % cream Apply topically 2 (two) times daily.   STOOL SOFTENER 100 MG capsule 2 capsules daily.   triamcinolone cream (KENALOG) 0.1 %    triamterene-hydrochlorothiazide (DYAZIDE) 37.5-25 MG capsule Take 1 capsule by mouth daily.   vitamin B-12 (CYANOCOBALAMIN) 1000 MCG tablet Take 1,000 mcg by mouth daily.   Vitamin D, Ergocalciferol, (DRISDOL) 1.25 MG (50000 UT) CAPS capsule Take 50,000 Units by mouth once a week.   aspirin EC 81 MG tablet Take 81 mg by mouth daily. (Patient not taking: Reported on 06/18/2022)   No facility-administered encounter medications on file as of 12/30/2022.    ALLERGIES: Allergies  Allergen  Reactions   Lisinopril Swelling   Amoxicillin    Dapagliflozin    Ezetimibe    Hydroxychloroquine  Metronidazole    Naproxen    Pioglitazone    Plaquenil  [Hydroxychloroquine Sulfate]    Statins    Wound Dressings     VACCINATION STATUS:  There is no immunization history on file for this patient.  Diabetes She presents for her follow-up diabetic visit. She has type 2 diabetes mellitus. Onset time: She was diagnosed at approximate age of 34 years.  She has a previous history of gestational diabetes. Her disease course has been improving. There are no hypoglycemic associated symptoms. Pertinent negatives for hypoglycemia include no confusion, headaches, pallor or seizures. Tremors: intermittent whole body tremors.Associated symptoms include fatigue and foot paresthesias. Pertinent negatives for diabetes include no chest pain, no polydipsia, no polyphagia, no polyuria and no weight loss. There are no hypoglycemic complications. Nocturnal hypoglycemia: rare. Symptoms are stable. Diabetic complications include peripheral neuropathy. Risk factors for coronary artery disease include diabetes mellitus, dyslipidemia, family history, hypertension, obesity, sedentary lifestyle, post-menopausal and tobacco exposure. Current diabetic treatment includes insulin injections and oral agent (dual therapy) (and Ozempic). She is compliant with treatment most of the time. Her weight is fluctuating minimally. She is following a generally healthy diet. When asked about meal planning, she reported none. She has had a previous visit with a dietitian. She never participates in exercise. Her home blood glucose trend is decreasing steadily. Her overall blood glucose range is >200 mg/dl. (She presents today, accompanied by her daughter, with her CGM showing improving glycemic profile overall.  Her POCT A1c today is 8.2%, improving from last visit of 8.8%.  She has resumed all her medications as prescribed, is seeing  positive results with glucose.  Analysis of her CGM shows TIR 33%, TAR 67%, TB 0% with a GMi of 8.4%.  She is in need to have multiple tooth extractions by the end of the year before insurance changes.) An ACE inhibitor/angiotensin II receptor blocker is contraindicated. She sees a podiatrist.Eye exam is current.  Thyroid Problem Presents for follow-up visit. Onset time: She is saying that she was diagnosed with hypothyroidism more than 20 years ago, has been on her current dose of levothyroxine 75 mcg for more than 5 years. Symptoms include fatigue, hair loss and weight gain. Patient reports no cold intolerance, constipation, depressed mood, diarrhea, heat intolerance, palpitations or weight loss. Tremors: intermittent whole body tremors.(He brought in bags of hair which she has lost on any given day.  Her daughter also had to cut mats out of her hair.  She was wondering if this hair loss was related to her thyroid levels.) The symptoms have been stable. Her past medical history is significant for diabetes, hyperlipidemia, neuropathy and obesity. Risk factors include family history of hypothyroidism.  Hyperlipidemia This is a chronic problem. The current episode started more than 1 year ago. The problem is controlled. Recent lipid tests were reviewed and are normal. Exacerbating diseases include diabetes, hypothyroidism and obesity. Factors aggravating her hyperlipidemia include thiazides and fatty foods. Pertinent negatives include no chest pain, myalgias or shortness of breath. She is currently on no antihyperlipidemic treatment (statin intolerant. on welchol). Compliance problems include adherence to diet and adherence to exercise.  Risk factors for coronary artery disease include diabetes mellitus, dyslipidemia, a sedentary lifestyle, post-menopausal, family history, obesity and hypertension.    Review of systems  Constitutional: +stable body weight,  current Body mass index is 36.93 kg/m. 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, needs teeth extractions but needs A1c under control first Cardiovascular: no chest pain, no shortness of breath, no palpitations, + leg swelling Respiratory: no cough, no shortness of breath Gastrointestinal: no nausea/vomiting/diarrhea Musculoskeletal: + muscle/joint aches, disequilibrium with falls Skin: + rashes to abdominal folds (undergoing surgical eval for panniculectomy but needs to lose more weight and get A1c under control), mouth and legs, blisters to BLE  Neurological: no tremors, no numbness, no tingling, no dizziness Psychiatric: no depression, no anxiety   Objective:    BP (!) 142/72 (BP Location: Right Arm, Patient Position: Sitting, Cuff Size: Large) Comment: Retake Manuel Cuff- Patient follows Dr. Mila Palmer for her HTN - patient made aware  Pulse 99   Ht 5' 3.5" (1.613 m)   Wt 211 lb 12.8 oz (96.1 kg)   BMI 36.93 kg/m   Wt Readings from Last 3 Encounters:  12/30/22 211 lb 12.8 oz (96.1 kg)  09/29/22 215 lb 12.8 oz (97.9 kg)  06/18/22 212 lb 6.4 oz (96.3 kg)    BP Readings from Last 3 Encounters:  12/30/22 (!) 142/72  09/29/22 117/67  06/18/22 116/72     Physical Exam- Limited  Constitutional:  Body mass index is 36.93 kg/m. , not in acute distress, normal state of mind Eyes:  EOMI, no exophthalmos Musculoskeletal: no gross deformities, strength intact in all four extremities, no gross restriction of joint movements Skin:  no rashes, no hyperemia Neurological: no tremor with outstretched hands   Diabetic Foot Exam - Simple   Simple Foot Form Diabetic Foot exam was performed with the following findings: Yes 12/30/2022  3:38 PM  Visual Inspection See comments: Yes Sensation Testing See comments: Yes Pulse Check Posterior  Tibialis and Dorsalis pulse intact bilaterally: Yes Comments Decreased sensation to monofilament tool bilaterally, calluses noted bilaterally, hammer toe deformity to second toe on right foot     Recent Results (from the past 2160 hour(s))  Comprehensive metabolic panel     Status: None   Collection Time: 11/24/22  2:57 PM  Result Value Ref Range   Glucose 90 70 - 99 mg/dL   BUN 10 8 - 27 mg/dL   Creatinine, Ser 6.29 0.57 - 1.00 mg/dL   eGFR 84 >52 WU/XLK/4.40   BUN/Creatinine Ratio 14 12 - 28   Sodium 142 134 - 144 mmol/L   Potassium 4.3 3.5 - 5.2 mmol/L   Chloride 98 96 - 106 mmol/L   CO2 28 20 - 29 mmol/L   Calcium 9.7 8.7 - 10.3 mg/dL   Total Protein 6.5 6.0 - 8.5 g/dL   Albumin 4.1 3.8 - 4.8 g/dL   Globulin, Total 2.4 1.5 - 4.5 g/dL   Bilirubin Total <1.0 0.0 - 1.2 mg/dL   Alkaline Phosphatase 83 44 - 121 IU/L   AST 33 0 - 40 IU/L   ALT 21 0 - 32 IU/L  TSH     Status: None   Collection Time: 11/24/22  2:57 PM  Result Value Ref Range   TSH 0.810 0.450 - 4.500 uIU/mL  T4, free     Status: None   Collection Time: 11/24/22  2:57 PM  Result Value Ref Range   Free T4 1.23 0.82 - 1.77 ng/dL  HgB U7O     Status: Abnormal   Collection Time: 12/30/22  3:20 PM  Result Value Ref Range   Hemoglobin A1C 8.2 (A) 4.0 - 5.6 %   HbA1c POC (<> result, manual entry)  HbA1c, POC (prediabetic range)     HbA1c, POC (controlled diabetic range)         Assessment & Plan:   1) Uncontrolled type 2 diabetes mellitus with hyperglycemia (HCC)  - Gal Chmiel has currently uncontrolled symptomatic type 2 DM since 77 years of age.   She presents today, accompanied by her daughter, with her CGM showing improving glycemic profile overall.  Her POCT A1c today is 8.2%, improving from last visit of 8.8%.  She has resumed all her medications as prescribed, is seeing positive results with glucose.  Analysis of her CGM shows TIR 33%, TAR 67%, TB 0% with a GMi of 8.4%.  She is in need to have  multiple tooth extractions by the end of the year before insurance changes.  - Recent labs reviewed.  - I had a long discussion with her about the progressive nature of diabetes and the pathology behind its complications. -her diabetes is complicated by obesity/sedentary life, peripheral neuropathy and she remains at a high risk for more acute and chronic complications which include CAD, CVA, CKD, retinopathy, and neuropathy. These are all discussed in detail with her.  - Nutritional counseling repeated at each appointment due to patients tendency to fall back in to old habits.  - The patient admits there is a room for improvement in their diet and drink choices. -  Suggestion is made for the patient to avoid simple carbohydrates from their diet including Cakes, Sweet Desserts / Pastries, Ice Cream, Soda (diet and regular), Sweet Tea, Candies, Chips, Cookies, Sweet Pastries, Store Bought Juices, Alcohol in Excess of 1-2 drinks a day, Artificial Sweeteners, Coffee Creamer, and "Sugar-free" Products. This will help patient to have stable blood glucose profile and potentially avoid unintended weight gain.   - I encouraged the patient to switch to unprocessed or minimally processed complex starch and increased protein intake (animal or plant source), fruits, and vegetables.   - Patient is advised to stick to a routine mealtimes to eat 3 meals a day and avoid unnecessary snacks (to snack only to correct hypoglycemia).                    - I have approached her with the following individualized plan to manage  her diabetes and patient agrees:   -she will continue to need multiple daily injections of insulin in order for her to achieve and maintain control of diabetes to target.  Cost is a major concern for her.  I also discussed the potential to start her on insulin pump in the future which they will look into about cost.  -For simplicity reasons, we agreed that premixed insulin is the best choice.     -She is advised to increase her Ozempic to 1 mg SQ weekly (may double inject the 0.5 mg dose to equal the 1 mg dose until she depletes that supply), continue her 70/30 80 units BID with meals if glucose is above 90 and she is eating, Metformin 1000 mg po twice daily with meals, and Glipizide 10 mg XL daily with breakfast.    -She is encouraged to continue consistently monitoring glucose at least 3 times per day, before injecting insulin (at breakfast and supper) and before bed using her CGM device.  She is encouraged to call the clinic if she has readings less than 70 or greater than 300 for 3 tests in a row.  - she is warned not to take insulin without proper monitoring per orders.  -  Patient specific target  A1c;  LDL, HDL, Triglycerides, were discussed in detail.  2) Blood Pressure /Hypertension: Her blood pressure is controlled to target.  She is advised to continue Lasix 40 mg po daily, Metoprolol 25 mg po twice daily, and Triamterene-HCT 37.5-25 mg po daily.    3) Lipids/Hyperlipidemia:  Her most recent lipid panel from 06/03/22 shows uncontrolled LDL of 114 and elevated triglycerides of 207 (improving).  She is working on PAP for Repatha as she is intolerant to statins and has Zetia listed as an allergy.     4)  Weight/Diet:  Her Body mass index is 36.93 kg/m.-  clearly complicating her diabetes care.  She is a candidate for modest weight loss.  I discussed with her the fact that loss of 5 - 10% of her  current body weight will have the most impact on her diabetes management.  CDE Consult will be initiated . Exercise, and detailed carbohydrates information provided  -  detailed on discharge instructions.  5) Hypothyroidism-longstanding -Her previsit TFTs are consistent with appropriate hormone replacement.  She is advised to stay on same dose of Synthroid 125 mcg po daily before breakfast.  Will recheck TFTs on subsequent visits and adjust if needed.   - We discussed about the correct  intake of her thyroid hormone, on empty stomach at fasting, with water, separated by at least 30 minutes from breakfast and other medications,  and separated by more than 4 hours from calcium, iron, multivitamins, acid reflux medications (PPIs). -Patient is made aware of the fact that thyroid hormone replacement is needed for life, dose to be adjusted by periodic monitoring of thyroid function tests.  6) Chronic Care/Health Maintenance: -she is not on ACEI/ARB or Statin medications due to multiple allergies and is encouraged to initiate and continue to follow up with Ophthalmology, Dentist, Podiatrist at least yearly or according to recommendations, and advised to  stay away from smoking. I have recommended yearly flu vaccine and pneumonia vaccine at least every 5 years; moderate intensity exercise for up to 150 minutes weekly; and  sleep for at least 7 hours a day.  - she is advised to locate her PMD for primary care needs, as well as her other providers for optimal and coordinated care.  I did give my approval for her tooth extractions.  Even though her A1c is not at goal, glucose levels are drastically improving every day.  I would anticipate her needing antibiotics at the dentists discretion.  I did give the patient her patient assistance medications from the fridge.     I spent  53  minutes in the care of the patient today including review of labs from CMP, Lipids, Thyroid Function, Hematology (current and previous including abstractions from other facilities); face-to-face time discussing  her blood glucose readings/logs, discussing hypoglycemia and hyperglycemia episodes and symptoms, medications doses, her options of short and long term treatment based on the latest standards of care / guidelines;  discussion about incorporating lifestyle medicine;  and documenting the encounter. Risk reduction counseling performed per USPSTF guidelines to reduce obesity and cardiovascular risk factors.      Please refer to Patient Instructions for Blood Glucose Monitoring and Insulin/Medications Dosing Guide"  in media tab for additional information. Please  also refer to " Patient Self Inventory" in the Media  tab for reviewed elements of pertinent patient history.  [name] participated in the discussions, expressed understanding, and voiced agreement with the above plans.  All questions were answered to  her satisfaction. she is encouraged to contact clinic should she have any questions or concerns prior to her return visit.   Follow up plan: - Return in about 3 months (around 04/01/2023) for Diabetes F/U with A1c in office, No previsit labs, Bring meter and logs.  Ronny Bacon, [facility] [address] [place], [place] [address] Phone: [phone] Fax: [phone]  12/30/2022, 3:52 PM

## 2022-12-30 NOTE — Telephone Encounter (Signed)
Gave pt her pt assistance at her appt

## 2023-01-01 ENCOUNTER — Telehealth: Payer: Self-pay | Admitting: Nurse Practitioner

## 2023-01-01 NOTE — Telephone Encounter (Signed)
Patient made aware that her pt assistance is here for pick up

## 2023-02-03 NOTE — Telephone Encounter (Signed)
Pt assistance was picked up

## 2023-03-11 ENCOUNTER — Telehealth: Payer: Self-pay | Admitting: Nurse Practitioner

## 2023-03-11 NOTE — Telephone Encounter (Signed)
2025 re enrollment for for [employer] PAP mailed to pt.

## 2023-03-16 ENCOUNTER — Telehealth: Payer: Self-pay | Admitting: Nurse Practitioner

## 2023-03-16 NOTE — Telephone Encounter (Signed)
[employer] called back ,she will p/u

## 2023-03-16 NOTE — Telephone Encounter (Signed)
Called pt's daughter (VM full), made aware that her pt assistance is here for pick up

## 2023-04-07 ENCOUNTER — Ambulatory Visit: Payer: Medicare PPO | Admitting: Nurse Practitioner

## 2023-04-22 ENCOUNTER — Ambulatory Visit (INDEPENDENT_AMBULATORY_CARE_PROVIDER_SITE_OTHER): Payer: Medicare Other | Admitting: Nurse Practitioner

## 2023-04-22 ENCOUNTER — Encounter: Payer: Self-pay | Admitting: Nurse Practitioner

## 2023-04-22 VITALS — BP 120/88 | HR 110 | Ht 63.5 in | Wt 217.0 lb

## 2023-04-22 DIAGNOSIS — Z794 Long term (current) use of insulin: Secondary | ICD-10-CM

## 2023-04-22 DIAGNOSIS — Z7985 Long-term (current) use of injectable non-insulin antidiabetic drugs: Secondary | ICD-10-CM | POA: Diagnosis not present

## 2023-04-22 DIAGNOSIS — E1165 Type 2 diabetes mellitus with hyperglycemia: Secondary | ICD-10-CM

## 2023-04-22 DIAGNOSIS — I10 Essential (primary) hypertension: Secondary | ICD-10-CM

## 2023-04-22 DIAGNOSIS — E782 Mixed hyperlipidemia: Secondary | ICD-10-CM

## 2023-04-22 DIAGNOSIS — Z7984 Long term (current) use of oral hypoglycemic drugs: Secondary | ICD-10-CM | POA: Diagnosis not present

## 2023-04-22 DIAGNOSIS — E039 Hypothyroidism, unspecified: Secondary | ICD-10-CM | POA: Diagnosis not present

## 2023-04-22 LAB — POCT GLYCOSYLATED HEMOGLOBIN (HGB A1C): Hemoglobin A1C: 8.3 % — AB (ref 4.0–5.6)

## 2023-04-22 MED ORDER — ZINC OXIDE 20 % EX OINT: TOPICAL_OINTMENT | Freq: Three times a day (TID) | CUTANEOUS | 3 refills | Status: DC | PRN

## 2023-04-22 NOTE — Progress Notes (Signed)
 04/22/2023, 3:54 PM   Endocrinology follow-up note    Subjective:    Patient ID: Jaime Waller, female    DOB: 10-03-1945.  Jaime Waller is being seen in follow-up after she was seen in consultation for  management of currently uncontrolled symptomatic diabetes requested by  Jonathon Bellows, DO.   Past Medical History:  Diagnosis Date   Anxiety    Asthma    Fibromyalgia    Hypertension    Hypothyroidism    Mixed hyperlipidemia    Opioid abuse (HCC)    Osteopenia    Restless leg    Type II diabetes mellitus, uncontrolled    Venous insufficiency    Vitamin D deficiency      Social History   Socioeconomic History   Marital status: Married    Spouse name: Not on file   Number of children: 2   Years of education: Not on file   Highest education level: Not on file  Occupational History   Not on file  Tobacco Use   Smoking status: Former    Current packs/day: 0.00    Types: Cigarettes    Quit date: 51    Years since quitting: 38.2   Smokeless tobacco: Never  Vaping Use   Vaping status: Never Used  Substance and Sexual Activity   Alcohol use: Never   Drug use: Never   Sexual activity: Not on file  Other Topics Concern   Not on file  Social History Narrative   Right Handed    Lives in a one story    Social Drivers of Health   Financial Resource Strain: Not on file  Food Insecurity: Not on file  Transportation Needs: Not on file  Physical Activity: Not on file  Stress: Not on file  Social Connections: Not on file    Family History  Problem Relation Age of Onset   Cancer Mother        Bladder Cancer & Breast Cancer   Diabetes Mellitus II Mother    Thyroid disease Mother    Hypertension Mother    CAD Mother    Kidney disease Mother    Heart failure Mother    Cancer Brother        Lung Cancer   Hypertension Brother    Diabetes Mellitus II Brother     Outpatient  Encounter Medications as of 04/22/2023  Medication Sig   Accu-Chek FastClix Lancets MISC USE AS DIRECTED TWICE DAILY   albuterol (VENTOLIN HFA) 108 (90 Base) MCG/ACT inhaler 4 (four) times daily as needed.   ALPRAZolam (XANAX) 0.5 MG tablet daily.   azelastine (ASTELIN) 0.1 % nasal spray  USE 2 SPRAY(S) IN EACH NOSTRIL TWICE DAILY   b complex vitamins capsule Take 1 capsule by mouth daily.   benzonatate (TESSALON) 200 MG capsule daily as needed.   brimonidine (ALPHAGAN) 0.2 % ophthalmic solution    Cholecalciferol (VITAMIN D3) 1.25 MG (50000 UT) CAPS Take 1 capsule by mouth once a week.   Continuous Glucose Sensor (DEXCOM G7 SENSOR) MISC Inject 1 Application into the skin as directed. Change sensor every 10 days as directed.   dicyclomine (BENTYL) 10 MG capsule 10  mg 3 (three) times daily before meals.    DROPLET PEN NEEDLES 31G X 5 MM MISC USE AS DIRECTED THREE TIMES DAILY   DULoxetine (CYMBALTA) 60 MG capsule Take by mouth.   famotidine (PEPCID) 20 MG tablet famotidine 20 mg tablet  TAKE 1 TABLET BY MOUTH TWICE DAILY   fluticasone (FLONASE) 50 MCG/ACT nasal spray 2 sprays daily.   furosemide (LASIX) 20 MG tablet Take 20 mg by mouth 2 (two) times daily.   gabapentin (NEURONTIN) 300 MG capsule at bedtime. Take one capsule  in the morning and two capsules at bedtime   gabapentin (NEURONTIN) 600 MG tablet Take 600 mg by mouth at bedtime.   glipiZIDE (GLUCOTROL XL) 10 MG 24 hr tablet TAKE 1 TABLET EVERY DAY WITH BREAKFAST   glucose blood (ACCU-CHEK GUIDE) test strip Use as instructed to monitor glucose 4 times daily.   Use as back up method for CGM   HYDROcodone-acetaminophen (NORCO/VICODIN) 5-325 MG tablet 2 (two) times daily.   hydrocortisone 2.5%-nystatin-zinc oxide 20% 1:1:1 ointment mixture Apply topically 3 (three) times daily as needed.   ibuprofen (ADVIL) 200 MG tablet Take 200 mg by mouth every 6 (six) hours as needed.   insulin aspart protamine - aspart (NOVOLOG MIX 70/30 FLEXPEN)  (70-30) 100 UNIT/ML FlexPen Inject 75 Units into the skin 2 (two) times daily with a meal.   lansoprazole (PREVACID) 30 MG capsule Take by mouth.   levocetirizine (XYZAL) 5 MG tablet Take 5 mg by mouth daily as needed.   levothyroxine (SYNTHROID) 125 MCG tablet Take 1 tablet (125 mcg total) by mouth daily before breakfast.   meloxicam (MOBIC) 7.5 MG tablet Take 7.5 mg by mouth daily.   metFORMIN (GLUCOPHAGE) 1000 MG tablet TAKE 1 TABLET TWICE DAILY   methocarbamol (ROBAXIN) 500 MG tablet Take by mouth.   metoCLOPramide (REGLAN) 5 MG tablet Take by mouth.   metoprolol tartrate (LOPRESSOR) 25 MG tablet Take 25 mg by mouth 2 (two) times daily with a meal.   nitrofurantoin (MACRODANTIN) 50 MG capsule Take 50 mg by mouth daily.   nystatin (MYCOSTATIN) 100000 UNIT/ML suspension Take 5 mLs by mouth 4 (four) times daily.   nystatin cream (MYCOSTATIN) 2 (two) times daily.   PERCOCET 5-325 MG tablet    phenazopyridine (PYRIDIUM) 200 MG tablet Take 200 mg by mouth as needed.   pramipexole (MIRAPEX) 1 MG tablet Take 1 tablet by mouth 2 (two) times daily.    Semaglutide (OZEMPIC, 2 MG/DOSE, [place]) Inject 2 mg into the skin once a week.   Sod Fluoride-Potassium Nitrate (PREVIDENT 5000 ENAMEL PROTECT) 1.1-5 % GEL After you floss, brush for two full minutes at night ONLY. Spit excess toothpaste out.   SODIUM FLUORIDE 5000 SENSITIVE 1.1-5 % GEL Take by mouth.   SSD 1 % cream Apply topically 2 (two) times daily.   STOOL SOFTENER 100 MG capsule 2 capsules daily.   triamcinolone cream (KENALOG) 0.1 %    triamterene-hydrochlorothiazide (DYAZIDE) 37.5-25 MG capsule Take 1 capsule by mouth daily.   vitamin B-12 (CYANOCOBALAMIN) 1000 MCG tablet Take 1,000 mcg by mouth daily.   Vitamin D, Ergocalciferol, (DRISDOL) 1.25 MG (50000 UT) CAPS capsule Take 50,000 Units by mouth once a week.   aspirin EC 81 MG tablet Take 81 mg by mouth daily. (Patient not taking: Reported on 04/22/2023)   predniSONE (DELTASONE) 5 MG tablet   (Patient not taking: Reported on 04/22/2023)   No facility-administered encounter medications on file as of 04/22/2023.    ALLERGIES: Allergies  Allergen Reactions   Lisinopril Swelling   Amoxicillin    Dapagliflozin    Ezetimibe    Hydroxychloroquine    Metronidazole    Naproxen    Pioglitazone    Plaquenil  [Hydroxychloroquine Sulfate]    Statins    Wound Dressings     VACCINATION STATUS:  There is no immunization history on file for this patient.  Diabetes She presents for her follow-up diabetic visit. She has type 2 diabetes mellitus. Onset time: She was diagnosed at approximate age of 71 years.  She has a previous history of gestational diabetes. Her disease course has been improving. There are no hypoglycemic associated symptoms. Pertinent negatives for hypoglycemia include no confusion, headaches, pallor or seizures. Tremors: intermittent whole body tremors.Associated symptoms include fatigue and foot paresthesias. Pertinent negatives for diabetes include no chest pain, no polydipsia, no polyphagia, no polyuria and no weight loss. There are no hypoglycemic complications. Nocturnal hypoglycemia: rare. Symptoms are stable. Diabetic complications include peripheral neuropathy. Risk factors for coronary artery disease include diabetes mellitus, dyslipidemia, family history, hypertension, obesity, sedentary lifestyle, post-menopausal and tobacco exposure. Current diabetic treatment includes insulin injections and oral agent (dual therapy) (and Ozempic). She is compliant with treatment most of the time. Her weight is fluctuating minimally. She is following a generally healthy diet. When asked about meal planning, she reported none. She has had a previous visit with a dietitian. She never participates in exercise. Her home blood glucose trend is decreasing steadily. Her overall blood glucose range is 180-200 mg/dl. (She presents today, accompanied by her daughter, with her CGM showing improving  glycemic profile overall.  Her POCT A1c today is 8.3%,essentially unchanged from previous visit of 8.2%.  Analysis of her CGM shows TIR 44%, TAR 56%, TBR 0% with a GMI of 7.8%.  She has had her teeth extracted since last visit, has had several UTIs and yeast infections as well as skin rash.) An ACE inhibitor/angiotensin II receptor blocker is contraindicated. She sees a podiatrist.Eye exam is current.  Thyroid Problem Presents for follow-up visit. Symptoms include fatigue, hair loss and weight gain. Patient reports no cold intolerance, constipation, depressed mood, diarrhea, heat intolerance, palpitations or weight loss. Tremors: intermittent whole body tremors.(He brought in bags of hair which she has lost on any given day.  Her daughter also had to cut mats out of her hair.  She was wondering if this hair loss was related to her thyroid levels.) The symptoms have been stable. Her past medical history is significant for diabetes.  Hyperlipidemia This is a chronic problem. The current episode started more than 1 year ago. The problem is controlled. Recent lipid tests were reviewed and are normal. Exacerbating diseases include diabetes, hypothyroidism and obesity. Factors aggravating her hyperlipidemia include thiazides and fatty foods. Pertinent negatives include no chest pain, myalgias or shortness of breath. She is currently on no antihyperlipidemic treatment (statin intolerant. on welchol). Compliance problems include adherence to diet and adherence to exercise.  Risk factors for coronary artery disease include diabetes mellitus, dyslipidemia, a sedentary lifestyle, post-menopausal, family history, obesity and hypertension.    Review of systems  Constitutional: +stable body weight,  current Body mass index is 37.84 kg/m. 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 Neurological: no tremors, no numbness, no tingling, no dizziness Psychiatric: no depression, no anxiety   Objective:    BP 120/88 (BP Location: Right Arm, Patient Position: Sitting, Cuff Size: Large)   Pulse (!) 110   Ht 5' 3.5" (1.613 m)   Wt 217 lb (98.4 kg)   BMI 37.84 kg/m   Wt Readings from Last 3 Encounters:  04/22/23 217 lb (98.4 kg)  12/30/22 211 lb 12.8 oz (96.1 kg)  09/29/22 215 lb 12.8 oz (97.9 kg)    BP Readings from Last 3 Encounters:  04/22/23 120/88  12/30/22 (!) 142/72  09/29/22 117/67     Physical Exam- Limited  Constitutional:  Body mass index is 37.84 kg/m. , not in acute distress, normal state of mind Eyes:  EOMI, no exophthalmos Musculoskeletal: no gross deformities, strength intact in all four extremities, no gross restriction of joint movements Skin:  no hyperemia, MASD to groin Neurological: no tremor with outstretched hands   Diabetic Foot Exam - Simple   No data filed     Recent Results (from the past 2160 hours)  HgB A1c     Status: Abnormal   Collection Time: 04/22/23  3:29 PM  Result Value Ref Range   Hemoglobin A1C 8.3 (A) 4.0 - 5.6 %   HbA1c POC (<> result, manual entry)     HbA1c, POC (prediabetic range)     HbA1c, POC (controlled diabetic range)          Assessment & Plan:   1) Uncontrolled type 2 diabetes mellitus with hyperglycemia (HCC)  - Jaime Waller has currently uncontrolled symptomatic type 2 DM since 78 years of age.   She presents today, accompanied by her daughter, with her CGM showing improving glycemic profile overall.  Her POCT A1c today is 8.3%,essentially unchanged from previous visit of 8.2%.  Analysis of her CGM shows TIR 44%, TAR 56%, TBR 0% with a GMI of 7.8%.  She has had her teeth extracted since last visit, has had several UTIs and yeast infections as well as skin rash.  - Recent labs reviewed.  - I had a long discussion with her about the progressive nature of diabetes and the pathology behind its complications. -her diabetes is complicated by obesity/sedentary life, peripheral neuropathy and she remains at a high risk for more acute and chronic complications which include CAD, CVA, CKD, retinopathy, and neuropathy. These are all discussed in detail with her.  - Nutritional counseling repeated at each appointment due to patients tendency to fall back in to old habits.  - The patient admits there is a room for improvement in their diet and drink choices. -  Suggestion is made for the patient to avoid simple carbohydrates from their diet including Cakes, Sweet Desserts / Pastries, Ice Cream, Soda (diet and regular), Sweet Tea, Candies, Chips, Cookies, Sweet Pastries, Store Bought Juices, Alcohol in Excess of 1-2 drinks a day, Artificial Sweeteners, Coffee Creamer, and "Sugar-free" Products. This will help patient to have stable blood glucose profile and potentially avoid unintended weight gain.   - I encouraged the patient to switch to unprocessed or minimally processed complex starch and increased protein intake (animal or plant source), fruits, and vegetables.   -  Patient is advised to stick to a routine mealtimes to eat 3 meals a day and avoid unnecessary snacks (to snack only to correct hypoglycemia).                  - I have approached her with the following individualized plan to manage  her diabetes and patient  agrees:   -she will continue to need multiple daily injections of insulin in order for her to achieve and maintain control of diabetes to target.  Cost is a major concern for her.  I also discussed the potential to start her on insulin pump in the future which they will look into about cost.  -For simplicity reasons, we agreed that premixed insulin is the best choice.    -She is advised to increase her Ozempic to 2 mg SQ weekly (may double up on her 1 mg pens until she depletes supply), continue her 70/30 80 units BID with meals if glucose is above 90 and she is eating, Metformin 1000 mg po twice daily with meals, and STOP the Glipizide today.  -She is encouraged to continue consistently monitoring glucose at least 3 times per day, before injecting insulin (at breakfast and supper) and before bed using her CGM device.  She is encouraged to call the clinic if she has readings less than 70 or greater than 300 for 3 tests in a row.  - she is warned not to take insulin without proper monitoring per orders.  - Patient specific target  A1c;  LDL, HDL, Triglycerides, were discussed in detail.  2) Blood Pressure /Hypertension: Her blood pressure is controlled to target.  She is advised to continue Lasix 40 mg po daily, Metoprolol 25 mg po twice daily, and Triamterene-HCT 37.5-25 mg po daily.    3) Lipids/Hyperlipidemia:  Her most recent lipid panel from 06/03/22 shows uncontrolled LDL of 114 and elevated triglycerides of 207 (improving).  She is working on PAP for Repatha as she is intolerant to statins and has Zetia listed as an allergy.     4)  Weight/Diet:  Her Body mass index is 37.84 kg/m.-  clearly complicating her diabetes care.  She is a candidate for modest weight loss.  I discussed with her the fact that loss of 5 - 10% of her  current body weight will have the most impact on her diabetes management.  CDE Consult will be initiated . Exercise, and detailed carbohydrates information provided  -   detailed on discharge instructions.  5) Hypothyroidism-longstanding -Her previsit TFTs are consistent with appropriate hormone replacement.  She is advised to stay on same dose of Synthroid 125 mcg po daily before breakfast.  Will recheck TFTs on subsequent visits and adjust if needed.   - We discussed about the correct intake of her thyroid hormone, on empty stomach at fasting, with water, separated by at least 30 minutes from breakfast and other medications,  and separated by more than 4 hours from calcium, iron, multivitamins, acid reflux medications (PPIs). -Patient is made aware of the fact that thyroid hormone replacement is needed for life, dose to be adjusted by periodic monitoring of thyroid function tests.  6) Chronic Care/Health Maintenance: -she is not on ACEI/ARB or Statin medications due to multiple allergies and is encouraged to initiate and continue to follow up with Ophthalmology, Dentist, Podiatrist at least yearly or according to recommendations, and advised to  stay away from smoking. I have recommended yearly flu vaccine and pneumonia vaccine at least every 5  years; moderate intensity exercise for up to 150 minutes weekly; and  sleep for at least 7 hours a day.  - she is advised to locate her PMD for primary care needs, as well as her other providers for optimal and coordinated care.  I did give my approval for her tooth extractions.  Even though her A1c is not at goal, glucose levels are drastically improving every day.  I would anticipate her needing antibiotics at the dentists discretion.  I did give the patient her patient assistance medications from the fridge.  I also sent in script for compounded cream for her perineal area to act as a moisture barrier and help heal faster.     I spent  55  minutes in the care of the patient today including review of labs from CMP, Lipids, Thyroid Function, Hematology (current and previous including abstractions from other facilities);  face-to-face time discussing  her blood glucose readings/logs, discussing hypoglycemia and hyperglycemia episodes and symptoms, medications doses, her options of short and long term treatment based on the latest standards of care / guidelines;  discussion about incorporating lifestyle medicine;  and documenting the encounter. Risk reduction counseling performed per USPSTF guidelines to reduce obesity and cardiovascular risk factors.     Please refer to Patient Instructions for Blood Glucose Monitoring and Insulin/Medications Dosing Guide"  in media tab for additional information. Please  also refer to " Patient Self Inventory" in the Media  tab for reviewed elements of pertinent patient history.  Jaime Waller participated in the discussions, expressed understanding, and voiced agreement with the above plans.  All questions were answered to her satisfaction. she is encouraged to contact clinic should she have any questions or concerns prior to her return visit.   Follow up plan: - Return in about 3 months (around 07/23/2023) for Diabetes F/U with A1c in office, No previsit labs, Bring meter and logs.  Ronny Bacon, [facility] [address] [place], [place] [address] Phone: [phone] Fax: [phone]  04/22/2023, 3:54 PM

## 2023-04-23 ENCOUNTER — Telehealth: Payer: Self-pay | Admitting: Nurse Practitioner

## 2023-04-23 MED ORDER — ZINC OXIDE 20 % EX OINT: TOPICAL_OINTMENT | Freq: Three times a day (TID) | CUTANEOUS | 3 refills | Status: AC | PRN

## 2023-04-23 NOTE — Telephone Encounter (Signed)
 done

## 2023-04-23 NOTE — Telephone Encounter (Signed)
 Called and spoke with patient regarding her compound cream. Can you send it to [facility] on [facility]

## 2023-05-27 ENCOUNTER — Other Ambulatory Visit: Payer: Self-pay | Admitting: "Endocrinology

## 2023-06-01 ENCOUNTER — Telehealth: Payer: Self-pay | Admitting: Nurse Practitioner

## 2023-06-01 NOTE — Telephone Encounter (Signed)
 Pt's daughter is calling to check the status of the PA for Dexcom. She said it has to be sent to [facility]

## 2023-06-03 ENCOUNTER — Telehealth: Payer: Self-pay

## 2023-06-03 ENCOUNTER — Other Ambulatory Visit (HOSPITAL_COMMUNITY): Payer: Self-pay

## 2023-06-03 NOTE — Telephone Encounter (Signed)
-  she will continue to need multiple daily injections of insulin in order for her to achieve and maintain control of diabetes to target.  Cost is a major concern for her.  I also discussed the potential to start her on insulin pump in the future which they will look into about cost.   -For simplicity reasons, we agreed that premixed insulin is the best choice.     -She is advised to increase her Ozempic to 2 mg SQ weekly (may double up on her 1 mg pens until she depletes supply), continue her 70/30 80 units BID with meals if glucose is above 90 and she is eating, Metformin 1000 mg po twice daily with meals, and STOP the Glipizide today.   -She is encouraged to continue consistently monitoring glucose at least 3 times per day, before injecting insulin (at breakfast and supper) and before bed using her CGM device.  She is encouraged to call the clinic if she has readings less than 70 or greater than 300 for 3 tests in a row.  This is a portion of the office note from 04/2023. Patient is on insulin.

## 2023-06-03 NOTE — Telephone Encounter (Signed)
 Pharmacy Patient Advocate Encounter   Received notification from Pt Calls Messages that prior authorization for Dexcom G7 sensor is required/requested.   Insurance verification completed.   The patient is insured through [employer] .   CGM's will only be approved with the use of insulin or history of severe hypoglycemic episodes. I am unable to fond documentation to support either of those requirements.

## 2023-06-04 ENCOUNTER — Other Ambulatory Visit (HOSPITAL_COMMUNITY): Payer: Self-pay

## 2023-06-04 NOTE — Telephone Encounter (Signed)
 PA has been submitted.  Key: [id number]

## 2023-06-08 ENCOUNTER — Other Ambulatory Visit: Payer: Self-pay

## 2023-06-08 DIAGNOSIS — E1165 Type 2 diabetes mellitus with hyperglycemia: Secondary | ICD-10-CM

## 2023-06-08 MED ORDER — DEXCOM G7 SENSOR MISC
1.0000 | 0 refills | Status: DC
Start: 2023-06-08 — End: 2023-07-26

## 2023-06-24 NOTE — Telephone Encounter (Signed)
 Pharmacy Patient Advocate Encounter  Received notification from [facility] Medicare Part D that Prior Authorization for Dexcom G7 Sensor has been APPROVED from 06-04-2023 to 02-16-2024   PA #/Case ID/Reference #: [id number]

## 2023-07-24 ENCOUNTER — Other Ambulatory Visit: Payer: Self-pay | Admitting: Nurse Practitioner

## 2023-07-24 DIAGNOSIS — E1165 Type 2 diabetes mellitus with hyperglycemia: Secondary | ICD-10-CM

## 2023-08-04 ENCOUNTER — Encounter: Payer: Self-pay | Admitting: Nurse Practitioner

## 2023-08-04 ENCOUNTER — Ambulatory Visit (INDEPENDENT_AMBULATORY_CARE_PROVIDER_SITE_OTHER): Admitting: Nurse Practitioner

## 2023-08-04 VITALS — BP 128/78 | HR 100 | Ht 63.5 in | Wt 211.4 lb

## 2023-08-04 DIAGNOSIS — Z794 Long term (current) use of insulin: Secondary | ICD-10-CM | POA: Diagnosis not present

## 2023-08-04 DIAGNOSIS — E039 Hypothyroidism, unspecified: Secondary | ICD-10-CM | POA: Diagnosis not present

## 2023-08-04 DIAGNOSIS — E538 Deficiency of other specified B group vitamins: Secondary | ICD-10-CM

## 2023-08-04 DIAGNOSIS — Z7984 Long term (current) use of oral hypoglycemic drugs: Secondary | ICD-10-CM | POA: Diagnosis not present

## 2023-08-04 DIAGNOSIS — E559 Vitamin D deficiency, unspecified: Secondary | ICD-10-CM

## 2023-08-04 DIAGNOSIS — L97519 Non-pressure chronic ulcer of other part of right foot with unspecified severity: Secondary | ICD-10-CM

## 2023-08-04 DIAGNOSIS — I10 Essential (primary) hypertension: Secondary | ICD-10-CM

## 2023-08-04 DIAGNOSIS — Z7985 Long-term (current) use of injectable non-insulin antidiabetic drugs: Secondary | ICD-10-CM

## 2023-08-04 DIAGNOSIS — E1165 Type 2 diabetes mellitus with hyperglycemia: Secondary | ICD-10-CM | POA: Diagnosis not present

## 2023-08-04 DIAGNOSIS — E782 Mixed hyperlipidemia: Secondary | ICD-10-CM

## 2023-08-04 DIAGNOSIS — E11621 Type 2 diabetes mellitus with foot ulcer: Secondary | ICD-10-CM

## 2023-08-04 LAB — POCT GLYCOSYLATED HEMOGLOBIN (HGB A1C): Hemoglobin A1C: 8.1 % — AB (ref 4.0–5.6)

## 2023-08-04 NOTE — Progress Notes (Signed)
 08/04/2023, 4:31 PM   Endocrinology follow-up note    Subjective:    Patient ID: Jaime Waller, female    DOB: July 24, 1945.  Jaime Waller is being seen in follow-up after she was seen in consultation for  management of currently uncontrolled symptomatic diabetes requested by  Ava Lei, DO.   Past Medical History:  Diagnosis Date   Anxiety    Asthma    Fibromyalgia    Hypertension    Hypothyroidism    Mixed hyperlipidemia    Opioid abuse (HCC)    Osteopenia    Restless leg    Type II diabetes mellitus, uncontrolled    Venous insufficiency    Vitamin D  deficiency      Social History   Socioeconomic History   Marital status: Married    Spouse name: Not on file   Number of children: 2   Years of education: Not on file   Highest education level: Not on file  Occupational History   Not on file  Tobacco Use   Smoking status: Former    Current packs/day: 0.00    Types: Cigarettes    Quit date: 49    Years since quitting: 38.4   Smokeless tobacco: Never  Vaping Use   Vaping status: Never Used  Substance and Sexual Activity   Alcohol use: Never   Drug use: Never   Sexual activity: Not on file  Other Topics Concern   Not on file  Social History Narrative   Right Handed    Lives in a one story    Social Drivers of Health   Financial Resource Strain: Not on file  Food Insecurity: Not on file  Transportation Needs: Not on file  Physical Activity: Not on file  Stress: Not on file  Social Connections: Not on file    Family History  Problem Relation Age of Onset   Cancer Mother        Bladder Cancer & Breast Cancer   Diabetes Mellitus II Mother    Thyroid disease Mother    Hypertension Mother    CAD Mother    Kidney disease Mother    Heart failure Mother    Cancer Brother        Lung Cancer   Hypertension Brother    Diabetes Mellitus II Brother     Outpatient  Encounter Medications as of 08/04/2023  Medication Sig   Accu-Chek FastClix Lancets MISC USE AS DIRECTED TWICE DAILY   albuterol (VENTOLIN HFA) 108 (90 Base) MCG/ACT inhaler 4 (four) times daily as needed.   ALPRAZolam (XANAX) 0.5 MG tablet daily.   azelastine (ASTELIN) 0.1 % nasal spray  USE 2 SPRAY(S) IN EACH NOSTRIL TWICE DAILY   b complex vitamins capsule Take 1 capsule by mouth daily.   benzonatate (TESSALON) 200 MG capsule daily as needed.   brimonidine (ALPHAGAN) 0.2 % ophthalmic solution    Cholecalciferol (VITAMIN D3) 1.25 MG (50000 UT) CAPS Take 1 capsule by mouth once a week.   Continuous Glucose Sensor (DEXCOM G7 SENSOR) MISC INJECT 1 SENSOR INTO THE SKIN AS DIRECTED. CHANGE SENSOR EVERY 10 DAYS AS DIRECTED   dicyclomine (BENTYL) 10 MG capsule 10  mg 3 (three) times daily before meals.    DROPLET PEN NEEDLES 31G X 5 MM MISC USE AS DIRECTED THREE TIMES DAILY   DULoxetine (CYMBALTA) 60 MG capsule Take by mouth.   famotidine (PEPCID) 20 MG tablet famotidine 20 mg tablet  TAKE 1 TABLET BY MOUTH TWICE DAILY   fluticasone (FLONASE) 50 MCG/ACT nasal spray 2 sprays daily.   furosemide (LASIX) 20 MG tablet Take 20 mg by mouth 2 (two) times daily.   gabapentin (NEURONTIN) 300 MG capsule at bedtime. Take one capsule  in the morning and two capsules at bedtime   gabapentin (NEURONTIN) 600 MG tablet Take 600 mg by mouth at bedtime.   aspirin EC 81 MG tablet Take 81 mg by mouth daily. (Patient not taking: Reported on 04/22/2023)   glipiZIDE  (GLUCOTROL  XL) 10 MG 24 hr tablet TAKE 1 TABLET EVERY DAY WITH BREAKFAST (Patient not taking: Reported on 08/04/2023)   glucose blood (ONETOUCH ULTRA TEST) test strip USE 1 STRIP TO CHECK GLUCOSE THREE TIMES DAILY BEFORE  MEALS   HYDROcodone-acetaminophen (NORCO/VICODIN) 5-325 MG tablet 2 (two) times daily.   hydrocortisone 2.5%-nystatin-zinc  oxide 20% 1:1:1 ointment mixture Apply topically 3 (three) times daily as needed.   ibuprofen (ADVIL) 200 MG tablet Take  200 mg by mouth every 6 (six) hours as needed.   insulin aspart  protamine - aspart (NOVOLOG  MIX 70/30 FLEXPEN) (70-30) 100 UNIT/ML FlexPen Inject 75 Units into the skin 2 (two) times daily with a meal.   lansoprazole (PREVACID) 30 MG capsule Take by mouth.   levocetirizine (XYZAL) 5 MG tablet Take 5 mg by mouth daily as needed.   levothyroxine  (SYNTHROID ) 125 MCG tablet Take 1 tablet (125 mcg total) by mouth daily before breakfast.   meloxicam (MOBIC) 7.5 MG tablet Take 7.5 mg by mouth daily.   metFORMIN  (GLUCOPHAGE ) 1000 MG tablet TAKE 1 TABLET TWICE DAILY   methocarbamol (ROBAXIN) 500 MG tablet Take by mouth.   metoCLOPramide (REGLAN) 5 MG tablet Take by mouth.   metoprolol tartrate (LOPRESSOR) 25 MG tablet Take 25 mg by mouth 2 (two) times daily with a meal.   nitrofurantoin (MACRODANTIN) 50 MG capsule Take 50 mg by mouth daily.   nystatin (MYCOSTATIN) 100000 UNIT/ML suspension Take 5 mLs by mouth 4 (four) times daily.   nystatin cream (MYCOSTATIN) 2 (two) times daily.   PERCOCET 5-325 MG tablet    phenazopyridine (PYRIDIUM) 200 MG tablet Take 200 mg by mouth as needed.   pramipexole (MIRAPEX) 1 MG tablet Take 1 tablet by mouth 2 (two) times daily.    predniSONE (DELTASONE) 5 MG tablet  (Patient not taking: Reported on 04/22/2023)   Semaglutide  (OZEMPIC , 2 MG/DOSE, [place]) Inject 2 mg into the skin once a week.   Sod Fluoride-Potassium Nitrate (PREVIDENT 5000 ENAMEL PROTECT) 1.1-5 % GEL After you floss, brush for two full minutes at night ONLY. Spit excess toothpaste out.   SODIUM FLUORIDE 5000 SENSITIVE 1.1-5 % GEL Take by mouth.   SSD 1 % cream Apply topically 2 (two) times daily.   STOOL SOFTENER 100 MG capsule 2 capsules daily.   triamcinolone cream (KENALOG) 0.1 %    triamterene-hydrochlorothiazide (DYAZIDE) 37.5-25 MG capsule Take 1 capsule by mouth daily.   vitamin B-12 (CYANOCOBALAMIN) 1000 MCG tablet Take 1,000 mcg by mouth daily.   Vitamin D , Ergocalciferol , (DRISDOL) 1.25 MG (50000  UT) CAPS capsule Take 50,000 Units by mouth once a week.   No facility-administered encounter medications on file as of 08/04/2023.    ALLERGIES: Allergies  Allergen Reactions   Lisinopril Swelling   Amoxicillin    Dapagliflozin    Ezetimibe    Hydroxychloroquine    Metronidazole    Naproxen    Pioglitazone    Plaquenil  [Hydroxychloroquine Sulfate]    Statins    Wound Dressings     VACCINATION STATUS:  There is no immunization history on file for this patient.  Diabetes She presents for her follow-up diabetic visit. She has type 2 diabetes mellitus. Onset time: She was diagnosed at approximate age of 59 years.  She has a previous history of gestational diabetes. Her disease course has been improving. There are no hypoglycemic associated symptoms. Pertinent negatives for hypoglycemia include no confusion, headaches, pallor or seizures. Tremors: intermittent whole body tremors.Associated symptoms include fatigue and foot paresthesias. Pertinent negatives for diabetes include no chest pain, no polydipsia, no polyphagia, no polyuria and no weight loss. There are no hypoglycemic complications. Nocturnal hypoglycemia: rare. Symptoms are stable. Diabetic complications include peripheral neuropathy. Risk factors for coronary artery disease include diabetes mellitus, dyslipidemia, family history, hypertension, obesity, sedentary lifestyle, post-menopausal and tobacco exposure. Current diabetic treatment includes insulin injections and oral agent (dual therapy) (and Ozempic ). She is compliant with treatment most of the time. Her weight is fluctuating minimally. She is following a generally healthy diet. When asked about meal planning, she reported none. She has had a previous visit with a dietitian. She never participates in exercise. Her home blood glucose trend is decreasing steadily. Her overall blood glucose range is 140-180 mg/dl. (She presents today, accompanied by her daughter, with her CGM  showing improving glycemic profile.  Her POCT A1c today is 8.1%, improving from last visit of 8.3%.  Analysis of her CGM shows TIR 47%, TAR 53%, TBR 0% with a GMI of 7.8%.  She has had several UTIs/yeast infections since last visit.  She also has some lingering skin issues too-seeing wound care for this.  She did have oral surgery, has dentures but did not wear them today (needed the glue for them first).) An ACE inhibitor/angiotensin II receptor blocker is contraindicated. She sees a podiatrist.Eye exam is current.  Thyroid Problem Presents for follow-up visit. Symptoms include fatigue, hair loss and weight gain. Patient reports no cold intolerance, constipation, depressed mood, diarrhea, heat intolerance, palpitations or weight loss. Tremors: intermittent whole body tremors.(He brought in bags of hair which she has lost on any given day.  Her daughter also had to cut mats out of her hair.  She was wondering if this hair loss was related to her thyroid levels.) The symptoms have been stable. Her past medical history is significant for diabetes and hyperlipidemia.  Hyperlipidemia This is a chronic problem. The current episode started more than 1 year ago. The problem is controlled. Recent lipid tests were reviewed and are normal. Exacerbating diseases include diabetes, hypothyroidism and obesity. Factors aggravating her hyperlipidemia include thiazides and fatty foods. Pertinent negatives include no chest pain, myalgias or shortness of breath. She is currently on no antihyperlipidemic treatment (statin intolerant. on welchol). Compliance problems include adherence to diet and adherence to exercise.  Risk factors for coronary artery disease include diabetes mellitus, dyslipidemia, a sedentary lifestyle, post-menopausal, family history, obesity and hypertension.    Review of systems  Constitutional: +stable body weight,  current Body mass index is 36.86 kg/m. , + fatigue, no subjective hyperthermia, no  subjective hypothermia Eyes: no blurry vision, no xerophthalmia ENT: no nodules palpated in throat, no dysphagia/odynophagia, no hoarseness, diagnosed with burning mouth syndrome Cardiovascular:  no chest pain, no shortness of breath, no palpitations Respiratory: no cough, no shortness of breath Gastrointestinal: no nausea/vomiting/diarrhea Musculoskeletal: + muscle/joint aches, disequilibrium with falls Skin: + rashes to abdominal folds (undergoing surgical eval for panniculectomy but needs to lose more weight and get A1c under control), mouth and legs, blisters to BLE, skin breakdown to groin/perineal area- seeing wound care clinic for this Neurological: no tremors, no numbness, no tingling, no dizziness Psychiatric: no depression, no anxiety   Objective:    BP 128/78 (BP Location: Left Arm, Patient Position: Sitting, Cuff Size: Large)   Pulse 100   Ht 5' 3.5 (1.613 m)   Wt 211 lb 6.4 oz (95.9 kg)   BMI 36.86 kg/m   Wt Readings from Last 3 Encounters:  08/04/23 211 lb 6.4 oz (95.9 kg)  04/22/23 217 lb (98.4 kg)  12/30/22 211 lb 12.8 oz (96.1 kg)    BP Readings from Last 3 Encounters:  08/04/23 128/78  04/22/23 120/88  12/30/22 (!) 142/72     Physical Exam- Limited  Constitutional:  Body mass index is 36.86 kg/m. , not in acute distress, normal state of mind Eyes:  EOMI, no exophthalmos Musculoskeletal: no gross deformities, strength intact in all four extremities, no gross restriction of joint movements Skin:  no hyperemia, MASD to groin Neurological: no tremor with outstretched hands   Diabetic Foot Exam - Simple   No data filed     Recent Results (from the past 2160 hours)  HgB A1c     Status: Abnormal   Collection Time: 08/04/23  3:26 PM  Result Value Ref Range   Hemoglobin A1C 8.1 (A) 4.0 - 5.6 %   HbA1c POC (<> result, manual entry)     HbA1c, POC (prediabetic range)     HbA1c, POC (controlled diabetic range)           Assessment & Plan:   1)  Uncontrolled type 2 diabetes mellitus with hyperglycemia (HCC)  - Chantille Navarrete has currently uncontrolled symptomatic type 2 DM since 78 years of age.   She presents today, accompanied by her daughter, with her CGM showing improving glycemic profile.  Her POCT A1c today is 8.1%, improving from last visit of 8.3%.  Analysis of her CGM shows TIR 47%, TAR 53%, TBR 0% with a GMI of 7.8%.  She has had several UTIs/yeast infections since last visit.  She also has some lingering skin issues too-seeing wound care for this.  She did have oral surgery, has dentures but did not wear them today (needed the glue for them first).  - Recent labs reviewed.  - I had a long discussion with her about the progressive nature of diabetes and the pathology behind its complications. -her diabetes is complicated by obesity/sedentary life, peripheral neuropathy and she remains at a high risk for more acute and chronic complications which include CAD, CVA, CKD, retinopathy, and neuropathy. These are all discussed in detail with her.  - Nutritional counseling repeated at each appointment due to patients tendency to fall back in to old habits.  - The patient admits there is a room for improvement in their diet and drink choices. -  Suggestion is made for the patient to avoid simple carbohydrates from their diet including Cakes, Sweet Desserts / Pastries, Ice Cream, Soda (diet and regular), Sweet Tea, Candies, Chips, Cookies, Sweet Pastries, Store Bought Juices, Alcohol in Excess of 1-2 drinks a day, Artificial Sweeteners, Coffee Creamer, and Sugar-free Products. This will help patient to have stable blood  glucose profile and potentially avoid unintended weight gain.   - I encouraged the patient to switch to unprocessed or minimally processed complex starch and increased protein intake (animal or plant source), fruits, and vegetables.   - Patient is advised to stick to a routine mealtimes to eat 3 meals a day and avoid  unnecessary snacks (to snack only to correct hypoglycemia).                - I have approached her with the following individualized plan to manage  her diabetes and patient agrees:   -she will continue to need multiple daily injections of insulin in order for her to achieve and maintain control of diabetes to target.  Cost is a major concern for her.  I also discussed the potential to start her on insulin pump in the future which they will look into about cost.  -For simplicity reasons, we agreed that premixed insulin is the best choice.    -She is advised to continue Ozempic  2 mg SQ weekly, continue her 70/30 80 units with breakfast and lower supper dose to 70 units if glucose is above 90 and she is eating, and continue Metformin  1000 mg po twice daily with meals.  She has done well without the Glipizide , will keep her off for now.  -She is encouraged to continue consistently monitoring glucose at least 3 times per day, before injecting insulin (at breakfast and supper) and before bed using her CGM device.  She is encouraged to call the clinic if she has readings less than 70 or greater than 300 for 3 tests in a row.  She did have the Dexcom sensor rip her skin off last night when she was due for the change.  I advised daughter to start using baby oil to help loosen the adhesive first.  - she is warned not to take insulin without proper monitoring per orders.  - Patient specific target  A1c;  LDL, HDL, Triglycerides, were discussed in detail.  2) Blood Pressure /Hypertension: Her blood pressure is controlled to target.  She is advised to continue Lasix 40 mg po daily, Metoprolol 25 mg po twice daily, and Triamterene-HCT 37.5-25 mg po daily.    3) Lipids/Hyperlipidemia:  Her most recent lipid panel from 06/03/22 shows uncontrolled LDL of 114 and elevated triglycerides of 207 (improving).  She is working on PAP for Repatha as she is intolerant to statins and has Zetia listed as an allergy.      4)  Weight/Diet:  Her Body mass index is 36.86 kg/m.-  clearly complicating her diabetes care.  She is a candidate for modest weight loss.  I discussed with her the fact that loss of 5 - 10% of her  current body weight will have the most impact on her diabetes management.  CDE Consult will be initiated . Exercise, and detailed carbohydrates information provided  -  detailed on discharge instructions.  5) Hypothyroidism-longstanding -There are no recent TFTs to review.  She is advised to stay on same dose of Synthroid  125 mcg po daily before breakfast.  Will recheck TFTs before next visit and adjust if needed.   - We discussed about the correct intake of her thyroid hormone, on empty stomach at fasting, with water, separated by at least 30 minutes from breakfast and other medications,  and separated by more than 4 hours from calcium, iron, multivitamins, acid reflux medications (PPIs). -Patient is made aware of the fact that thyroid hormone replacement is needed  for life, dose to be adjusted by periodic monitoring of thyroid function tests.  6) Chronic Care/Health Maintenance: -she is not on ACEI/ARB or Statin medications due to multiple allergies and is encouraged to initiate and continue to follow up with Ophthalmology, Dentist, Podiatrist at least yearly or according to recommendations, and advised to  stay away from smoking. I have recommended yearly flu vaccine and pneumonia vaccine at least every 5 years; moderate intensity exercise for up to 150 minutes weekly; and  sleep for at least 7 hours a day.  - she is advised to locate her PMD for primary care needs, as well as her other providers for optimal and coordinated care.  -I am also checking b12 at her request as she has family history of deficiency and takes supplementation for this.     I spent  46  minutes in the care of the patient today including review of labs from CMP, Lipids, Thyroid Function, Hematology (current and previous  including abstractions from other facilities); face-to-face time discussing  her blood glucose readings/logs, discussing hypoglycemia and hyperglycemia episodes and symptoms, medications doses, her options of short and long term treatment based on the latest standards of care / guidelines;  discussion about incorporating lifestyle medicine;  and documenting the encounter. Risk reduction counseling performed per USPSTF guidelines to reduce obesity and cardiovascular risk factors.     Please refer to Patient Instructions for Blood Glucose Monitoring and Insulin/Medications Dosing Guide  in media tab for additional information. Please  also refer to  Patient Self Inventory in the Media  tab for reviewed elements of pertinent patient history.  Marvelyn Slim participated in the discussions, expressed understanding, and voiced agreement with the above plans.  All questions were answered to her satisfaction. she is encouraged to contact clinic should she have any questions or concerns prior to her return visit.   Follow up plan: - Return in about 3 months (around 11/04/2023) for Diabetes F/U with A1c in office, Bring meter and logs, Previsit labs, Thyroid follow up.  Hulon Magic, [facility] [address] [place], [place] [address] Phone: [phone] Fax: [phone]  08/04/2023, 4:31 PM

## 2023-10-07 ENCOUNTER — Encounter (HOSPITAL_BASED_OUTPATIENT_CLINIC_OR_DEPARTMENT_OTHER): Admitting: General Surgery

## 2023-10-26 ENCOUNTER — Encounter (HOSPITAL_BASED_OUTPATIENT_CLINIC_OR_DEPARTMENT_OTHER): Attending: General Surgery | Admitting: General Surgery

## 2023-10-26 DIAGNOSIS — L89323 Pressure ulcer of left buttock, stage 3: Secondary | ICD-10-CM | POA: Insufficient documentation

## 2023-10-26 DIAGNOSIS — E11622 Type 2 diabetes mellitus with other skin ulcer: Secondary | ICD-10-CM | POA: Insufficient documentation

## 2023-10-26 DIAGNOSIS — L89313 Pressure ulcer of right buttock, stage 3: Secondary | ICD-10-CM | POA: Insufficient documentation

## 2023-11-02 ENCOUNTER — Encounter (HOSPITAL_BASED_OUTPATIENT_CLINIC_OR_DEPARTMENT_OTHER): Admitting: General Surgery

## 2023-11-02 DIAGNOSIS — E11622 Type 2 diabetes mellitus with other skin ulcer: Secondary | ICD-10-CM | POA: Diagnosis not present

## 2023-11-04 ENCOUNTER — Ambulatory Visit (INDEPENDENT_AMBULATORY_CARE_PROVIDER_SITE_OTHER): Admitting: Nurse Practitioner

## 2023-11-04 ENCOUNTER — Encounter: Payer: Self-pay | Admitting: Nurse Practitioner

## 2023-11-04 VITALS — BP 138/66 | HR 99 | Ht 63.5 in | Wt 212.8 lb

## 2023-11-04 DIAGNOSIS — E039 Hypothyroidism, unspecified: Secondary | ICD-10-CM

## 2023-11-04 DIAGNOSIS — Z794 Long term (current) use of insulin: Secondary | ICD-10-CM | POA: Diagnosis not present

## 2023-11-04 DIAGNOSIS — Z7985 Long-term (current) use of injectable non-insulin antidiabetic drugs: Secondary | ICD-10-CM

## 2023-11-04 DIAGNOSIS — E1165 Type 2 diabetes mellitus with hyperglycemia: Secondary | ICD-10-CM

## 2023-11-04 DIAGNOSIS — Z7984 Long term (current) use of oral hypoglycemic drugs: Secondary | ICD-10-CM | POA: Diagnosis not present

## 2023-11-04 DIAGNOSIS — E782 Mixed hyperlipidemia: Secondary | ICD-10-CM

## 2023-11-04 DIAGNOSIS — I10 Essential (primary) hypertension: Secondary | ICD-10-CM

## 2023-11-04 DIAGNOSIS — E559 Vitamin D deficiency, unspecified: Secondary | ICD-10-CM

## 2023-11-04 LAB — COMPREHENSIVE METABOLIC PANEL WITH GFR
ALT: 20 IU/L (ref 0–32)
AST: 52 IU/L — ABNORMAL HIGH (ref 0–40)
Albumin: 3.6 g/dL — ABNORMAL LOW (ref 3.8–4.8)
Alkaline Phosphatase: 105 IU/L (ref 49–135)
BUN/Creatinine Ratio: 11 — ABNORMAL LOW (ref 12–28)
BUN: 8 mg/dL (ref 8–27)
Bilirubin Total: 0.2 mg/dL (ref 0.0–1.2)
CO2: 22 mmol/L (ref 20–29)
Calcium: 9.2 mg/dL (ref 8.7–10.3)
Chloride: 101 mmol/L (ref 96–106)
Creatinine, Ser: 0.71 mg/dL (ref 0.57–1.00)
Globulin, Total: 2.8 g/dL (ref 1.5–4.5)
Glucose: 140 mg/dL — ABNORMAL HIGH (ref 70–99)
Potassium: 4.4 mmol/L (ref 3.5–5.2)
Sodium: 141 mmol/L (ref 134–144)
Total Protein: 6.4 g/dL (ref 6.0–8.5)
eGFR: 87 mL/min/1.73 (ref >59)

## 2023-11-04 LAB — MICROALBUMIN / CREATININE URINE RATIO
Creatinine, Urine: 74.4 mg/dL
Microalb/Creat Ratio: 17 mg/g{creat} (ref 0–29)
Microalbumin, Urine: 12.7 ug/mL

## 2023-11-04 LAB — POCT GLYCOSYLATED HEMOGLOBIN (HGB A1C): Hemoglobin A1C: 8.2 % — AB (ref 4.0–5.6)

## 2023-11-04 LAB — TSH: TSH: 0.538 u[IU]/mL (ref 0.450–4.500)

## 2023-11-04 LAB — LIPID PANEL
Chol/HDL Ratio: 3.7 ratio (ref 0.0–4.4)
Cholesterol, Total: 172 mg/dL (ref 100–199)
HDL: 47 mg/dL (ref >39)
LDL Chol Calc (NIH): 91 mg/dL (ref 0–99)
Triglycerides: 197 mg/dL — ABNORMAL HIGH (ref 0–149)
VLDL Cholesterol Cal: 34 mg/dL (ref 5–40)

## 2023-11-04 LAB — T4, FREE: Free T4: 1.31 ng/dL (ref 0.82–1.77)

## 2023-11-04 LAB — VITAMIN D 25 HYDROXY (VIT D DEFICIENCY, FRACTURES): Vit D, 25-Hydroxy: 63.9 ng/mL (ref 30.0–100.0)

## 2023-11-04 LAB — VITAMIN B12: Vitamin B-12: 422 pg/mL (ref 232–1245)

## 2023-11-04 NOTE — Progress Notes (Signed)
 11/04/2023, 4:23 PM   Endocrinology follow-up note    Subjective:    Patient ID: Jaime Waller, female    DOB: 1945/08/10.  Jaime Waller is being seen in follow-up after she was seen in consultation for  management of currently uncontrolled symptomatic diabetes requested by  Lonna Millman, DO.   Past Medical History:  Diagnosis Date   Anxiety    Asthma    Fibromyalgia    Hypertension    Hypothyroidism    Mixed hyperlipidemia    Opioid abuse (HCC)    Osteopenia    Restless leg    Type II diabetes mellitus, uncontrolled    Venous insufficiency    Vitamin D  deficiency      Social History   Socioeconomic History   Marital status: Married    Spouse name: Not on file   Number of children: 2   Years of education: Not on file   Highest education level: Not on file  Occupational History   Not on file  Tobacco Use   Smoking status: Former    Current packs/day: 0.00    Types: Cigarettes    Quit date: 38    Years since quitting: 38.7   Smokeless tobacco: Never  Vaping Use   Vaping status: Never Used  Substance and Sexual Activity   Alcohol use: Never   Drug use: Never   Sexual activity: Not on file  Other Topics Concern   Not on file  Social History Narrative   Right Handed    Lives in a one story    Social Drivers of Health   Financial Resource Strain: Not on file  Food Insecurity: Not on file  Transportation Needs: Not on file  Physical Activity: Not on file  Stress: Not on file  Social Connections: Not on file    Family History  Problem Relation Age of Onset   Cancer Mother        Bladder Cancer & Breast Cancer   Diabetes Mellitus II Mother    Thyroid disease Mother    Hypertension Mother    CAD Mother    Kidney disease Mother    Heart failure Mother    Cancer Brother        Lung Cancer   Hypertension Brother    Diabetes Mellitus II Brother     Outpatient  Encounter Medications as of 11/04/2023  Medication Sig   Accu-Chek FastClix Lancets MISC USE AS DIRECTED TWICE DAILY   albuterol (VENTOLIN HFA) 108 (90 Base) MCG/ACT inhaler 4 (four) times daily as needed.   ALPRAZolam (XANAX) 0.5 MG tablet daily.   azelastine (ASTELIN) 0.1 % nasal spray  USE 2 SPRAY(S) IN EACH NOSTRIL TWICE DAILY   b complex vitamins capsule Take 1 capsule by mouth daily.   benzonatate (TESSALON) 200 MG capsule daily as needed.   brimonidine (ALPHAGAN) 0.2 % ophthalmic solution    Cholecalciferol (VITAMIN D3) 1.25 MG (50000 UT) CAPS Take 1 capsule by mouth once a week.   Continuous Glucose Sensor (DEXCOM G7 SENSOR) MISC INJECT 1 SENSOR INTO THE SKIN AS DIRECTED. CHANGE SENSOR EVERY 10 DAYS AS DIRECTED   dicyclomine (BENTYL) 10 MG capsule 10  mg 3 (three) times daily before meals.    DROPLET PEN NEEDLES 31G X 5 MM MISC USE AS DIRECTED THREE TIMES DAILY   DULoxetine (CYMBALTA) 60 MG capsule Take by mouth.   famotidine (PEPCID) 20 MG tablet famotidine 20 mg tablet  TAKE 1 TABLET BY MOUTH TWICE DAILY   fluticasone (FLONASE) 50 MCG/ACT nasal spray 2 sprays daily.   furosemide (LASIX) 20 MG tablet Take 20 mg by mouth 2 (two) times daily.   gabapentin (NEURONTIN) 600 MG tablet Take 600 mg by mouth at bedtime.   glucose blood (ONETOUCH ULTRA TEST) test strip USE 1 STRIP TO CHECK GLUCOSE THREE TIMES DAILY BEFORE  MEALS   HYDROcodone-acetaminophen (NORCO/VICODIN) 5-325 MG tablet 2 (two) times daily.   hydrocortisone 2.5%-nystatin-zinc  oxide 20% 1:1:1 ointment mixture Apply topically 3 (three) times daily as needed.   ibuprofen (ADVIL) 200 MG tablet Take 200 mg by mouth every 6 (six) hours as needed.   insulin aspart  protamine - aspart (NOVOLOG  MIX 70/30 FLEXPEN) (70-30) 100 UNIT/ML FlexPen Inject 75 Units into the skin 2 (two) times daily with a meal. (Patient taking differently: Inject 75 Units into the skin 2 (two) times daily with a meal. Patient states that she injects 80 units in  the morning, 70 units in the evening)   lansoprazole (PREVACID) 30 MG capsule Take by mouth.   levocetirizine (XYZAL) 5 MG tablet Take 5 mg by mouth daily as needed.   levothyroxine  (SYNTHROID ) 125 MCG tablet Take 1 tablet (125 mcg total) by mouth daily before breakfast.   meloxicam (MOBIC) 7.5 MG tablet Take 7.5 mg by mouth daily.   metFORMIN  (GLUCOPHAGE ) 1000 MG tablet TAKE 1 TABLET TWICE DAILY (Patient taking differently: Take 1,000 mg by mouth 2 (two) times daily with a meal.)   methocarbamol (ROBAXIN) 500 MG tablet Take by mouth.   metoCLOPramide (REGLAN) 5 MG tablet Take by mouth.   metoprolol tartrate (LOPRESSOR) 25 MG tablet Take 25 mg by mouth 2 (two) times daily with a meal.   nitrofurantoin (MACRODANTIN) 50 MG capsule Take 50 mg by mouth daily.   nystatin (MYCOSTATIN) 100000 UNIT/ML suspension Take 5 mLs by mouth 4 (four) times daily.   nystatin cream (MYCOSTATIN) 2 (two) times daily.   PERCOCET 5-325 MG tablet    phenazopyridine (PYRIDIUM) 200 MG tablet Take 200 mg by mouth as needed.   pramipexole (MIRAPEX) 1 MG tablet Take 1 tablet by mouth 2 (two) times daily.    Semaglutide  (OZEMPIC , 2 MG/DOSE, [place]) Inject 2 mg into the skin once a week.   Sod Fluoride-Potassium Nitrate (PREVIDENT 5000 ENAMEL PROTECT) 1.1-5 % GEL After you floss, brush for two full minutes at night ONLY. Spit excess toothpaste out.   SODIUM FLUORIDE 5000 SENSITIVE 1.1-5 % GEL Take by mouth.   SSD 1 % cream Apply topically 2 (two) times daily.   STOOL SOFTENER 100 MG capsule 2 capsules daily.   triamcinolone cream (KENALOG) 0.1 %    triamterene-hydrochlorothiazide (DYAZIDE) 37.5-25 MG capsule Take 1 capsule by mouth daily.   vitamin B-12 (CYANOCOBALAMIN) 1000 MCG tablet Take 1,000 mcg by mouth daily.   Vitamin D , Ergocalciferol , (DRISDOL) 1.25 MG (50000 UT) CAPS capsule Take 50,000 Units by mouth once a week.   aspirin EC 81 MG tablet Take 81 mg by mouth daily. (Patient not taking: Reported on 11/04/2023)    gabapentin (NEURONTIN) 300 MG capsule at bedtime. Take one capsule  in the morning and two capsules at bedtime (Patient not taking: Reported on 11/04/2023)  glipiZIDE  (GLUCOTROL  XL) 10 MG 24 hr tablet TAKE 1 TABLET EVERY DAY WITH BREAKFAST (Patient not taking: Reported on 11/04/2023)   predniSONE (DELTASONE) 5 MG tablet  (Patient not taking: Reported on 11/04/2023)   No facility-administered encounter medications on file as of 11/04/2023.    ALLERGIES: Allergies  Allergen Reactions   Lisinopril Swelling   Amoxicillin    Dapagliflozin    Ezetimibe    Hydroxychloroquine    Metronidazole    Naproxen    Pioglitazone    Plaquenil  [Hydroxychloroquine Sulfate]    Statins    Wound Dressings     VACCINATION STATUS:  There is no immunization history on file for this patient.  Diabetes She presents for her follow-up diabetic visit. She has type 2 diabetes mellitus. Onset time: She was diagnosed at approximate age of 47 years.  She has a previous history of gestational diabetes. Her disease course has been fluctuating. There are no hypoglycemic associated symptoms. Pertinent negatives for hypoglycemia include no confusion, headaches, pallor or seizures. Tremors: intermittent whole body tremors.Associated symptoms include fatigue and foot paresthesias. Pertinent negatives for diabetes include no polydipsia, no polyphagia, no polyuria and no weight loss. There are no hypoglycemic complications. Nocturnal hypoglycemia: rare. Symptoms are stable. Diabetic complications include peripheral neuropathy. Risk factors for coronary artery disease include diabetes mellitus, dyslipidemia, family history, hypertension, obesity, sedentary lifestyle, post-menopausal and tobacco exposure. Current diabetic treatment includes insulin injections and oral agent (dual therapy) (and Ozempic ). She is compliant with treatment most of the time. Her weight is fluctuating minimally. She is following a generally healthy diet. When  asked about meal planning, she reported none. She has had a previous visit with a dietitian. She never participates in exercise. Her home blood glucose trend is fluctuating minimally. Her overall blood glucose range is 140-180 mg/dl. (She presents today, accompanied by her daughter, with her CGM slightly above target glycemic profile overall.  Her POCT A1c today is 8.2%, essentially unchanged.  Analysis of her CGM shows TIR 32%, TAR 68%, TBR 0% with a GMI of 8.6%.  She has had several UTIs/yeast infections since last visit.  She also has some lingering skin issues too-seeing new wound care in [place] for this.  ) An ACE inhibitor/angiotensin II receptor blocker is contraindicated. She sees a podiatrist.Eye exam is current.  Thyroid Problem Presents for follow-up visit. Symptoms include fatigue, hair loss and weight gain. Patient reports no cold intolerance, constipation, depressed mood, diarrhea, heat intolerance, palpitations or weight loss. Tremors: intermittent whole body tremors.(He brought in bags of hair which she has lost on any given day.  Her daughter also had to cut mats out of her hair.  She was wondering if this hair loss was related to her thyroid levels.) The symptoms have been stable.    Review of systems  Constitutional: +stable body weight,  current Body mass index is 37.1 kg/m. , + fatigue, no subjective hyperthermia, no subjective hypothermia Eyes: no blurry vision, no xerophthalmia ENT: no nodules palpated in throat, no dysphagia/odynophagia, no hoarseness, diagnosed with burning mouth syndrome Cardiovascular: no chest pain, no shortness of breath, no palpitations Respiratory: no cough, no shortness of breath Gastrointestinal: no nausea/vomiting/diarrhea Musculoskeletal: + muscle/joint aches, disequilibrium with falls Skin: + rashes to abdominal folds (undergoing surgical eval for panniculectomy but needs to lose more weight and get A1c under control), mouth and legs, blisters to BLE,  skin breakdown to groin/perineal area- seeing wound care clinic for this Neurological: no tremors, no numbness, no tingling, no dizziness  Psychiatric: no depression, no anxiety   Objective:    BP 138/66 (BP Location: Left Arm, Patient Position: Sitting, Cuff Size: Large)   Pulse 99   Ht 5' 3.5 (1.613 m)   Wt 212 lb 12.8 oz (96.5 kg)   BMI 37.10 kg/m   Wt Readings from Last 3 Encounters:  11/04/23 212 lb 12.8 oz (96.5 kg)  08/04/23 211 lb 6.4 oz (95.9 kg)  04/22/23 217 lb (98.4 kg)    BP Readings from Last 3 Encounters:  11/04/23 138/66  08/04/23 128/78  04/22/23 120/88     Physical Exam- Limited  Constitutional:  Body mass index is 37.1 kg/m. , not in acute distress, normal state of mind Eyes:  EOMI, no exophthalmos Musculoskeletal: no gross deformities, strength intact in all four extremities, no gross restriction of joint movements Skin:  no hyperemia Neurological: no tremor with outstretched hands   Diabetic Foot Exam - Simple   No data filed     Recent Results (from the past 2160 hours)  Comprehensive metabolic panel with GFR     Status: Abnormal   Collection Time: 11/03/23  3:12 PM  Result Value Ref Range   Glucose 140 (H) 70 - 99 mg/dL   BUN 8 8 - 27 mg/dL   Creatinine, Ser 9.28 0.57 - 1.00 mg/dL   eGFR 87 >40 fO/fpw/8.26   BUN/Creatinine Ratio 11 (L) 12 - 28   Sodium 141 134 - 144 mmol/L   Potassium 4.4 3.5 - 5.2 mmol/L   Chloride 101 96 - 106 mmol/L   CO2 22 20 - 29 mmol/L   Calcium 9.2 8.7 - 10.3 mg/dL   Total Protein 6.4 6.0 - 8.5 g/dL   Albumin 3.6 (L) 3.8 - 4.8 g/dL   Globulin, Total 2.8 1.5 - 4.5 g/dL   Bilirubin Total 0.2 0.0 - 1.2 mg/dL   Alkaline Phosphatase 105 49 - 135 IU/L    Comment:               **Please note reference interval change**   AST 52 (H) 0 - 40 IU/L   ALT 20 0 - 32 IU/L  Lipid panel     Status: Abnormal   Collection Time: 11/03/23  3:12 PM  Result Value Ref Range   Cholesterol, Total 172 100 - 199 mg/dL    Triglycerides 802 (H) 0 - 149 mg/dL   HDL 47 >60 mg/dL   VLDL Cholesterol Cal 34 5 - 40 mg/dL   LDL Chol Calc (NIH) 91 0 - 99 mg/dL   Chol/HDL Ratio 3.7 0.0 - 4.4 ratio    Comment:                                   T. Chol/HDL Ratio                                             Men  Women                               1/2 Avg.Risk  3.4    3.3  Avg.Risk  5.0    4.4                                2X Avg.Risk  9.6    7.1                                3X Avg.Risk 23.4   11.0   TSH     Status: None   Collection Time: 11/03/23  3:12 PM  Result Value Ref Range   TSH 0.538 0.450 - 4.500 uIU/mL  T4, free     Status: None   Collection Time: 11/03/23  3:12 PM  Result Value Ref Range   Free T4 1.31 0.82 - 1.77 ng/dL  Microalbumin / creatinine urine ratio     Status: None   Collection Time: 11/03/23  3:12 PM  Result Value Ref Range   Creatinine, Urine 74.4 Not Estab. mg/dL   Microalbumin, Urine 87.2 Not Estab. ug/mL   Microalb/Creat Ratio 17 0 - 29 mg/g creat    Comment:                        Normal:                0 -  29                        Moderately increased: 30 - 300                        Severely increased:       >300   VITAMIN D  25 Hydroxy (Vit-D Deficiency, Fractures)     Status: None   Collection Time: 11/03/23  3:12 PM  Result Value Ref Range   Vit D, 25-Hydroxy 63.9 30.0 - 100.0 ng/mL    Comment: Vitamin D  deficiency has been defined by the Institute of Medicine and an Endocrine Society practice guideline as a level of serum 25-OH vitamin D  less than 20 ng/mL (1,2). The Endocrine Society went on to further define vitamin D  insufficiency as a level between 21 and 29 ng/mL (2). 1. IOM (Institute of Medicine). 2010. Dietary reference    intakes for calcium and D. [place]: The    [employer]. 2. Holick MF, Binkley [place], Bischoff-Ferrari HA, et al.    Evaluation, treatment, and prevention of vitamin D     deficiency: an  Endocrine Society clinical practice    guideline. JCEM. 2011 Jul; 96(7):1911-30.   Vitamin B12     Status: None   Collection Time: 11/03/23  3:12 PM  Result Value Ref Range   Vitamin B-12 422 232 - 1,245 pg/mL  HgB A1c     Status: Abnormal   Collection Time: 11/04/23  4:03 PM  Result Value Ref Range   Hemoglobin A1C 8.2 (A) 4.0 - 5.6 %   HbA1c POC (<> result, manual entry)     HbA1c, POC (prediabetic range)     HbA1c, POC (controlled diabetic range)           Assessment & Plan:   1) Uncontrolled type 2 diabetes mellitus with hyperglycemia (HCC)  - Iria Jamerson has currently uncontrolled symptomatic type 2 DM since 78 years of age.   She presents today, accompanied by her daughter, with her CGM slightly above target  glycemic profile overall.  Her POCT A1c today is 8.2%, essentially unchanged.  Analysis of her CGM shows TIR 32%, TAR 68%, TBR 0% with a GMI of 8.6%.  She has had several UTIs/yeast infections since last visit.  She also has some lingering skin issues too-seeing new wound care in [place] for this.    - Recent labs reviewed.  - I had a long discussion with her about the progressive nature of diabetes and the pathology behind its complications. -her diabetes is complicated by obesity/sedentary life, peripheral neuropathy and she remains at a high risk for more acute and chronic complications which include CAD, CVA, CKD, retinopathy, and neuropathy. These are all discussed in detail with her.  - Nutritional counseling repeated at each appointment due to patients tendency to fall back in to old habits.  - The patient admits there is a room for improvement in their diet and drink choices. -  Suggestion is made for the patient to avoid simple carbohydrates from their diet including Cakes, Sweet Desserts / Pastries, Ice Cream, Soda (diet and regular), Sweet Tea, Candies, Chips, Cookies, Sweet Pastries, Store Bought Juices, Alcohol in Excess of 1-2 drinks a day, Artificial  Sweeteners, Coffee Creamer, and Sugar-free Products. This will help patient to have stable blood glucose profile and potentially avoid unintended weight gain.   - I encouraged the patient to switch to unprocessed or minimally processed complex starch and increased protein intake (animal or plant source), fruits, and vegetables.   - Patient is advised to stick to a routine mealtimes to eat 3 meals a day and avoid unnecessary snacks (to snack only to correct hypoglycemia).       - I have approached her with the following individualized plan to manage  her diabetes and patient agrees:   -she will continue to need multiple daily injections of insulin in order for her to achieve and maintain control of diabetes to target.  Cost is a major concern for her.  I also discussed the potential to start her on insulin pump in the future which they will look into about cost.  -For simplicity reasons, we agreed that premixed insulin is the best choice.    -She is advised to continue Ozempic  2 mg SQ weekly, continue her 70/30 80 units with breakfast and bump back to 80 units with supper if glucose is above 90 and she is eating, and continue Metformin  1000 mg po twice daily with meals.  She has done well without the Glipizide , will keep her off for now.  -She is encouraged to continue consistently monitoring glucose at least 3 times per day, before injecting insulin (at breakfast and supper) and before bed using her CGM device.  She is encouraged to call the clinic if she has readings less than 70 or greater than 300 for 3 tests in a row.  She did have the Dexcom sensor rip her skin off last night when she was due for the change.  I advised daughter to start using baby oil to help loosen the adhesive first.  - she is warned not to take insulin without proper monitoring per orders.  - Patient specific target  A1c;  LDL, HDL, Triglycerides, were discussed in detail.  2) Blood Pressure /Hypertension: Her blood  pressure is controlled to target.  She is advised to continue her meds as prescribed by PCP.    3) Lipids/Hyperlipidemia:  Her most recent lipid panel from 11/03/23 shows controlled LDL of 91 and elevated triglycerides of 197 (  improving).  She is working on PAP for Repatha as she is intolerant to statins and has Zetia listed as an allergy.     4)  Weight/Diet:  Her Body mass index is 37.1 kg/m.-  clearly complicating her diabetes care.  She is a candidate for modest weight loss.  I discussed with her the fact that loss of 5 - 10% of her  current body weight will have the most impact on her diabetes management.  CDE Consult will be initiated . Exercise, and detailed carbohydrates information provided  -  detailed on discharge instructions.  5) Hypothyroidism-longstanding -Her previsit TFTs are consistent with appropriate hormone replacement.  She is advised to stay on same dose of Synthroid  125 mcg po daily before breakfast.     - We discussed about the correct intake of her thyroid hormone, on empty stomach at fasting, with water, separated by at least 30 minutes from breakfast and other medications,  and separated by more than 4 hours from calcium, iron, multivitamins, acid reflux medications (PPIs). -Patient is made aware of the fact that thyroid hormone replacement is needed for life, dose to be adjusted by periodic monitoring of thyroid function tests.  6) Chronic Care/Health Maintenance: -she is not on ACEI/ARB or Statin medications due to multiple allergies and is encouraged to initiate and continue to follow up with Ophthalmology, Dentist, Podiatrist at least yearly or according to recommendations, and advised to  stay away from smoking. I have recommended yearly flu vaccine and pneumonia vaccine at least every 5 years; moderate intensity exercise for up to 150 minutes weekly; and  sleep for at least 7 hours a day.  - she is advised to locate her PMD for primary care needs, as well as her  other providers for optimal and coordinated care.       I spent  46  minutes in the care of the patient today including review of labs from CMP, Lipids, Thyroid Function, Hematology (current and previous including abstractions from other facilities); face-to-face time discussing  her blood glucose readings/logs, discussing hypoglycemia and hyperglycemia episodes and symptoms, medications doses, her options of short and long term treatment based on the latest standards of care / guidelines;  discussion about incorporating lifestyle medicine;  and documenting the encounter. Risk reduction counseling performed per USPSTF guidelines to reduce obesity and cardiovascular risk factors.     Please refer to Patient Instructions for Blood Glucose Monitoring and Insulin/Medications Dosing Guide  in media tab for additional information. Please  also refer to  Patient Self Inventory in the Media  tab for reviewed elements of pertinent patient history.  Meade Birmingham participated in the discussions, expressed understanding, and voiced agreement with the above plans.  All questions were answered to her satisfaction. she is encouraged to contact clinic should she have any questions or concerns prior to her return visit.   Follow up plan: - Return in about 3 months (around 02/03/2024) for Diabetes F/U with A1c in office, No previsit labs, Bring meter and logs.  Benton Rio, [facility] [address] [place], [place] [address] Phone: [phone] Fax: [phone]  11/04/2023, 4:23 PM

## 2023-11-04 NOTE — Patient Instructions (Signed)

## 2023-11-08 ENCOUNTER — Ambulatory Visit (HOSPITAL_BASED_OUTPATIENT_CLINIC_OR_DEPARTMENT_OTHER): Admitting: General Surgery

## 2023-11-16 ENCOUNTER — Ambulatory Visit (HOSPITAL_BASED_OUTPATIENT_CLINIC_OR_DEPARTMENT_OTHER): Admitting: Internal Medicine

## 2023-11-23 ENCOUNTER — Encounter (HOSPITAL_BASED_OUTPATIENT_CLINIC_OR_DEPARTMENT_OTHER): Attending: Internal Medicine | Admitting: General Surgery

## 2023-11-23 DIAGNOSIS — L89313 Pressure ulcer of right buttock, stage 3: Secondary | ICD-10-CM | POA: Insufficient documentation

## 2023-11-23 DIAGNOSIS — I890 Lymphedema, not elsewhere classified: Secondary | ICD-10-CM | POA: Diagnosis not present

## 2023-11-23 DIAGNOSIS — E11622 Type 2 diabetes mellitus with other skin ulcer: Secondary | ICD-10-CM | POA: Diagnosis present

## 2023-11-23 DIAGNOSIS — I872 Venous insufficiency (chronic) (peripheral): Secondary | ICD-10-CM | POA: Diagnosis not present

## 2023-12-06 ENCOUNTER — Ambulatory Visit (HOSPITAL_BASED_OUTPATIENT_CLINIC_OR_DEPARTMENT_OTHER): Admitting: General Surgery

## 2023-12-14 ENCOUNTER — Encounter (HOSPITAL_BASED_OUTPATIENT_CLINIC_OR_DEPARTMENT_OTHER): Admitting: General Surgery

## 2023-12-14 DIAGNOSIS — E11622 Type 2 diabetes mellitus with other skin ulcer: Secondary | ICD-10-CM | POA: Diagnosis not present

## 2023-12-27 ENCOUNTER — Encounter (HOSPITAL_BASED_OUTPATIENT_CLINIC_OR_DEPARTMENT_OTHER): Attending: General Surgery | Admitting: General Surgery

## 2024-02-03 ENCOUNTER — Ambulatory Visit: Admitting: Nurse Practitioner

## 2024-02-15 ENCOUNTER — Other Ambulatory Visit: Payer: Self-pay | Admitting: *Deleted

## 2024-02-15 DIAGNOSIS — E039 Hypothyroidism, unspecified: Secondary | ICD-10-CM

## 2024-02-15 MED ORDER — LEVOTHYROXINE SODIUM 125 MCG PO TABS: 125.0000 ug | ORAL_TABLET | Freq: Every day | ORAL | 0 refills | Status: AC

## 2024-03-02 ENCOUNTER — Ambulatory Visit: Admitting: Nurse Practitioner

## 2024-03-02 ENCOUNTER — Encounter: Payer: Self-pay | Admitting: Nurse Practitioner

## 2024-03-02 VITALS — BP 140/80 | HR 83 | Ht 63.5 in | Wt 196.4 lb

## 2024-03-02 DIAGNOSIS — E1165 Type 2 diabetes mellitus with hyperglycemia: Secondary | ICD-10-CM

## 2024-03-02 DIAGNOSIS — Z794 Long term (current) use of insulin: Secondary | ICD-10-CM

## 2024-03-02 DIAGNOSIS — E782 Mixed hyperlipidemia: Secondary | ICD-10-CM

## 2024-03-02 DIAGNOSIS — Z7985 Long-term (current) use of injectable non-insulin antidiabetic drugs: Secondary | ICD-10-CM

## 2024-03-02 DIAGNOSIS — E559 Vitamin D deficiency, unspecified: Secondary | ICD-10-CM

## 2024-03-02 DIAGNOSIS — E039 Hypothyroidism, unspecified: Secondary | ICD-10-CM | POA: Diagnosis not present

## 2024-03-02 DIAGNOSIS — I10 Essential (primary) hypertension: Secondary | ICD-10-CM | POA: Diagnosis not present

## 2024-03-02 DIAGNOSIS — Z7984 Long term (current) use of oral hypoglycemic drugs: Secondary | ICD-10-CM | POA: Diagnosis not present

## 2024-03-02 MED ORDER — GLIPIZIDE ER 5 MG PO TB24: 5.0000 mg | ORAL_TABLET | Freq: Every day | ORAL | 1 refills | Status: AC

## 2024-03-02 NOTE — Progress Notes (Signed)
 "                                                                            03/02/2024, 2:41 PM   Endocrinology follow-up note    Subjective:    Patient ID: Jaime Waller, female    DOB: Jan 24, 1980.  Jaime Waller is being seen in follow-up after she was seen in consultation for  management of currently uncontrolled symptomatic diabetes requested by  Lonna Millman, DO.   Past Medical History:  Diagnosis Date   Anxiety    Asthma    Fibromyalgia    Hypertension    Hypothyroidism    Mixed hyperlipidemia    Opioid abuse (HCC)    Osteopenia    Restless leg    Type II diabetes mellitus, uncontrolled    Venous insufficiency    Vitamin D  deficiency      Social History   Socioeconomic History   Marital status: Married    Spouse name: Not on file   Number of children: 2   Years of education: Not on file   Highest education level: Not on file  Occupational History   Not on file  Tobacco Use   Smoking status: Former    Current packs/day: 0.00    Average packs/day: 1.5 packs/day    Types: Cigarettes    Quit date: 93    Years since quitting: 39.0   Smokeless tobacco: Never  Vaping Use   Vaping status: Never Used  Substance and Sexual Activity   Alcohol use: Never   Drug use: Never   Sexual activity: Not on file  Other Topics Concern   Not on file  Social History Narrative   Right Handed    Lives in a one story    Social Drivers of Health   Tobacco Use: Medium Risk (03/02/2024)   Patient History    Smoking Tobacco Use: Former    Smokeless Tobacco Use: Never    Passive Exposure: Not on Actuary Strain: Not on file  Food Insecurity: Not on file  Transportation Needs: Not on file  Physical Activity: Not on file  Stress: Not on file  Social Connections: Not on file  Depression (PHQ2-9): Not on file  Alcohol Screen: Not on file  Housing: Not on file  Utilities: Not on file  Health Literacy: Not on file    Family History  Problem Relation  Age of Onset   Cancer Mother        Bladder Cancer & Breast Cancer   Diabetes Mellitus II Mother    Thyroid disease Mother    Hypertension Mother    CAD Mother    Kidney disease Mother    Heart failure Mother    Cancer Brother        Lung Cancer   Hypertension Brother    Diabetes Mellitus II Brother     Outpatient Encounter Medications as of 03/02/2024  Medication Sig   Accu-Chek FastClix Lancets MISC USE AS DIRECTED TWICE DAILY   albuterol (VENTOLIN HFA) 108 (90 Base) MCG/ACT inhaler 4 (four) times daily as needed.   ALPRAZolam (XANAX) 0.5 MG tablet daily.   azelastine (ASTELIN) 0.1 % nasal spray  USE 2 SPRAY(S)  IN EACH NOSTRIL TWICE DAILY   b complex vitamins capsule Take 1 capsule by mouth daily.   benzonatate (TESSALON) 200 MG capsule daily as needed.   brimonidine (ALPHAGAN) 0.2 % ophthalmic solution    Cholecalciferol (VITAMIN D3) 1.25 MG (50000 UT) CAPS Take 1 capsule by mouth once a week.   Continuous Glucose Sensor (DEXCOM G7 SENSOR) MISC INJECT 1 SENSOR INTO THE SKIN AS DIRECTED. CHANGE SENSOR EVERY 10 DAYS AS DIRECTED   dicyclomine (BENTYL) 10 MG capsule 10 mg 3 (three) times daily before meals.    DROPLET PEN NEEDLES 31G X 5 MM MISC USE AS DIRECTED THREE TIMES DAILY   DULoxetine (CYMBALTA) 60 MG capsule Take by mouth.   famotidine (PEPCID) 20 MG tablet famotidine 20 mg tablet  TAKE 1 TABLET BY MOUTH TWICE DAILY   fluticasone (FLONASE) 50 MCG/ACT nasal spray 2 sprays daily.   furosemide (LASIX) 20 MG tablet Take 20 mg by mouth 2 (two) times daily.   gabapentin (NEURONTIN) 600 MG tablet Take 600 mg by mouth at bedtime.   glipiZIDE  (GLUCOTROL  XL) 5 MG 24 hr tablet Take 1 tablet (5 mg total) by mouth daily with breakfast.   glucose blood (ONETOUCH ULTRA TEST) test strip USE 1 STRIP TO CHECK GLUCOSE THREE TIMES DAILY BEFORE  MEALS   HYDROcodone-acetaminophen (NORCO/VICODIN) 5-325 MG tablet 2 (two) times daily.   hydrocortisone 2.5%-nystatin-zinc  oxide 20% 1:1:1 ointment  mixture Apply topically 3 (three) times daily as needed.   ibuprofen (ADVIL) 200 MG tablet Take 200 mg by mouth every 6 (six) hours as needed.   insulin aspart  protamine - aspart (NOVOLOG  MIX 70/30 FLEXPEN) (70-30) 100 UNIT/ML FlexPen Inject 75 Units into the skin 2 (two) times daily with a meal. (Patient taking differently: Inject 75 Units into the skin 2 (two) times daily with a meal. Patient states that she injects 80 units in the morning, 70 units in the evening)   lansoprazole (PREVACID) 30 MG capsule Take by mouth.   levocetirizine (XYZAL) 5 MG tablet Take 5 mg by mouth daily as needed.   levothyroxine  (SYNTHROID ) 125 MCG tablet Take 1 tablet (125 mcg total) by mouth daily before breakfast.   meloxicam (MOBIC) 7.5 MG tablet Take 7.5 mg by mouth daily.   metFORMIN  (GLUCOPHAGE ) 1000 MG tablet TAKE 1 TABLET TWICE DAILY (Patient taking differently: Take 1,000 mg by mouth 2 (two) times daily with a meal.)   methocarbamol (ROBAXIN) 500 MG tablet Take by mouth.   metoCLOPramide (REGLAN) 5 MG tablet Take by mouth.   metoprolol tartrate (LOPRESSOR) 25 MG tablet Take 25 mg by mouth 2 (two) times daily with a meal.   nitrofurantoin (MACRODANTIN) 50 MG capsule Take 50 mg by mouth daily.   nystatin (MYCOSTATIN) 100000 UNIT/ML suspension Take 5 mLs by mouth 4 (four) times daily.   nystatin cream (MYCOSTATIN) 2 (two) times daily.   PERCOCET 5-325 MG tablet    phenazopyridine (PYRIDIUM) 200 MG tablet Take 200 mg by mouth as needed.   pramipexole (MIRAPEX) 1 MG tablet Take 1 tablet by mouth 2 (two) times daily.    Sod Fluoride-Potassium Nitrate (PREVIDENT 5000 ENAMEL PROTECT) 1.1-5 % GEL After you floss, brush for two full minutes at night ONLY. Spit excess toothpaste out.   SODIUM FLUORIDE 5000 SENSITIVE 1.1-5 % GEL Take by mouth.   SSD 1 % cream Apply topically 2 (two) times daily.   STOOL SOFTENER 100 MG capsule 2 capsules daily.   triamcinolone cream (KENALOG) 0.1 %    triamterene-hydrochlorothiazide  (  DYAZIDE) 37.5-25 MG capsule Take 1 capsule by mouth daily.   vitamin B-12 (CYANOCOBALAMIN) 1000 MCG tablet Take 1,000 mcg by mouth daily.   Vitamin D , Ergocalciferol , (DRISDOL) 1.25 MG (50000 UT) CAPS capsule Take 50,000 Units by mouth once a week.   [DISCONTINUED] aspirin EC 81 MG tablet Take 81 mg by mouth daily. (Patient not taking: Reported on 03/02/2024)   [DISCONTINUED] gabapentin (NEURONTIN) 300 MG capsule at bedtime. Take one capsule  in the morning and two capsules at bedtime (Patient not taking: Reported on 03/02/2024)   [DISCONTINUED] glipiZIDE  (GLUCOTROL  XL) 10 MG 24 hr tablet TAKE 1 TABLET EVERY DAY WITH BREAKFAST (Patient not taking: Reported on 03/02/2024)   [DISCONTINUED] predniSONE (DELTASONE) 5 MG tablet  (Patient not taking: Reported on 03/02/2024)   [DISCONTINUED] Semaglutide  (OZEMPIC , 2 MG/DOSE, [place]) Inject 2 mg into the skin once a week. (Patient not taking: Reported on 03/02/2024)   No facility-administered encounter medications on file as of 03/02/2024.    ALLERGIES: Allergies  Allergen Reactions   Lisinopril Swelling   Amoxicillin    Dapagliflozin    Ezetimibe    Hydroxychloroquine    Metronidazole    Naproxen    Pioglitazone    Plaquenil  [Hydroxychloroquine Sulfate]    Statins    Wound Dressings     VACCINATION STATUS:  There is no immunization history on file for this patient.  Diabetes She presents for her follow-up diabetic visit. She has type 2 diabetes mellitus. Onset time: She was diagnosed at approximate age of 57 years.  She has a previous history of gestational diabetes. Her disease course has been worsening. There are no hypoglycemic associated symptoms. Pertinent negatives for hypoglycemia include no confusion, headaches, pallor or seizures. Tremors: intermittent whole body tremors.Associated symptoms include blurred vision, fatigue, foot paresthesias, polydipsia, polyuria and weight loss. Pertinent negatives for diabetes include no polyphagia. There  are no hypoglycemic complications. Nocturnal hypoglycemia: rare. Symptoms are stable. Diabetic complications include peripheral neuropathy. Risk factors for coronary artery disease include diabetes mellitus, dyslipidemia, family history, hypertension, obesity, sedentary lifestyle, post-menopausal and tobacco exposure. Current diabetic treatment includes insulin injections and oral agent (dual therapy) (Not had Ozempic  since the summer). She is compliant with treatment most of the time. Her weight is fluctuating minimally. She is following a generally healthy diet. When asked about meal planning, she reported none. She has had a previous visit with a dietitian. She never participates in exercise. Her home blood glucose trend is increasing steadily. Her overall blood glucose range is >200 mg/dl. (She presents today, accompanied by her daughter, with her CGM showing gross hyperglycemia overall.  Her most recent A1c on 12/12 was 7.8%, improving from last A1c of 8.2%.   Analysis of her CGM shows TIR 5%, TAR 95% (52% in level 2 hyperglycemia), TBR 0% with a GMI of 9.5%.  She has not had her Ozempic  since the summer due to discontinuation from the patient assistance program.  She has continued to lose weight without the prescription which has prompted her PCP to be concerned.  She does have imaging coming up of abdomen.) An ACE inhibitor/angiotensin II receptor blocker is contraindicated. She sees a podiatrist.Eye exam is current.  Thyroid Problem Presents for follow-up visit. Symptoms include fatigue, hair loss, weight gain and weight loss. Patient reports no cold intolerance, constipation, depressed mood, diarrhea, heat intolerance or palpitations. Tremors: intermittent whole body tremors.(He brought in bags of hair which she has lost on any given day.  Her daughter also had to cut mats  out of her hair.  She was wondering if this hair loss was related to her thyroid Waller.) The symptoms have been stable.    Review  of systems  Constitutional: + decreasing body weight- decreased appetite,  current Body mass index is 34.24 kg/m. , + fatigue, no subjective hyperthermia, no subjective hypothermia, ongoing dental issues Eyes: no blurry vision, no xerophthalmia ENT: no sore throat, no nodules palpated in throat, no dysphagia/odynophagia, no hoarseness Cardiovascular: no chest pain, no shortness of breath, no palpitations, + leg swelling Respiratory: no cough, no shortness of breath Gastrointestinal: no nausea/vomiting/diarrhea Musculoskeletal: no muscle/joint aches Skin: no rashes, no hyperemia Neurological: no tremors, no numbness, no tingling, no dizziness Psychiatric: no depression, no anxiety   Objective:    BP (!) 140/80 (BP Location: Right Arm, Patient Position: Sitting, Cuff Size: Large) Comment: Retake with manuel cuff  Pulse 83   Ht 5' 3.5 (1.613 m)   Wt 196 lb 6.4 oz (89.1 kg)   BMI 34.24 kg/m   Wt Readings from Last 3 Encounters:  03/02/24 196 lb 6.4 oz (89.1 kg)  11/04/23 212 lb 12.8 oz (96.5 kg)  08/04/23 211 lb 6.4 oz (95.9 kg)    BP Readings from Last 3 Encounters:  03/02/24 (!) 140/80  11/04/23 138/66  08/04/23 128/78     Physical Exam- Limited  Constitutional:  Body mass index is 34.24 kg/m. , not in acute distress, normal state of mind Eyes:  EOMI, no exophthalmos Musculoskeletal: no gross deformities, strength intact in all four extremities, no gross restriction of joint movements Skin:  no hyperemia Neurological: no tremor with outstretched hands   Diabetic Foot Exam - Simple   No data filed     No results found for this or any previous visit (from the past 2160 hours).        Assessment & Plan:   1) Uncontrolled type 2 diabetes mellitus with hyperglycemia (HCC)  - Norine Reddington has currently uncontrolled symptomatic type 2 DM since 79 years of age.   She presents today, accompanied by her daughter, with her CGM showing gross hyperglycemia overall.   Her most recent A1c on 12/12 was 7.8%, improving from last A1c of 8.2%.   Analysis of her CGM shows TIR 5%, TAR 95% (52% in level 2 hyperglycemia), TBR 0% with a GMI of 9.5%.  She has not had her Ozempic  since the summer due to discontinuation from the patient assistance program.  She has continued to lose weight without the prescription which has prompted her PCP to be concerned.  She does have imaging coming up of abdomen.  - Recent labs reviewed.  - I had a long discussion with her about the progressive nature of diabetes and the pathology behind its complications. -her diabetes is complicated by obesity/sedentary life, peripheral neuropathy and she remains at a high risk for more acute and chronic complications which include CAD, CVA, CKD, retinopathy, and neuropathy. These are all discussed in detail with her.  - Nutritional counseling repeated/built upon at each appointment.  - The patient admits there is a room for improvement in their diet and drink choices. -  Suggestion is made for the patient to avoid simple carbohydrates from their diet including Cakes, Sweet Desserts / Pastries, Ice Cream, Soda (diet and regular), Sweet Tea, Candies, Chips, Cookies, Sweet Pastries, Store Bought Juices, Alcohol in Excess of 1-2 drinks a day, Artificial Sweeteners, Coffee Creamer, and Sugar-free Products. This will help patient to have stable blood glucose profile and potentially avoid  unintended weight gain.   - I encouraged the patient to switch to unprocessed or minimally processed complex starch and increased protein intake (animal or plant source), fruits, and vegetables.   - Patient is advised to stick to a routine mealtimes to eat 3 meals a day and avoid unnecessary snacks (to snack only to correct hypoglycemia).    - I have approached her with the following individualized plan to manage  her diabetes and patient agrees:   -she will continue to need multiple daily injections of insulin in order  for her to achieve and maintain control of diabetes to target.  Cost is a major concern for her.  I also discussed the potential to start her on insulin pump in the future which they will look into about cost.  -For simplicity reasons, we agreed that premixed insulin is the best choice.    -Increasing her 70/30 to 90 units with breakfast and 90 units with supper if glucose is above 90 and she is eating, and continue Metformin  1000 mg po twice daily with meals.  Since she is unable to get Ozempic  will restart Glipizide  5 mg XL daily at breakfast.  -She is encouraged to continue consistently monitoring glucose at least 3 times per day, before injecting insulin (at breakfast and supper) and before bed using her CGM device.  She is encouraged to call the clinic if she has readings less than 70 or greater than 300 for 3 tests in a row.    - she is warned not to take insulin without proper monitoring per orders.  - Patient specific target  A1c;  LDL, HDL, Triglycerides, were discussed in detail.  2) Blood Pressure /Hypertension: Her blood pressure is controlled to target.  She is advised to continue her meds as prescribed by PCP.    3) Lipids/Hyperlipidemia:  Her most recent lipid panel from 11/03/23 shows controlled LDL of 91 and elevated triglycerides of 197 (improving).  She is working on PAP for Repatha as she is intolerant to statins and has Zetia listed as an allergy.     4)  Weight/Diet:  Her Body mass index is 34.24 kg/m.-  clearly complicating her diabetes care.  She is a candidate for modest weight loss.  I discussed with her the fact that loss of 5 - 10% of her  current body weight will have the most impact on her diabetes management.  CDE Consult will be initiated . Exercise, and detailed carbohydrates information provided  -  detailed on discharge instructions.  5) Hypothyroidism-longstanding -There are no recent TFTs to review.  She is advised to stay on same dose of Synthroid  125 mcg  po daily before breakfast.  Will recheck TFTs at her next lab draw- she may need dosage reduction given significant weight loss.   - We discussed about the correct intake of her thyroid hormone, on empty stomach at fasting, with water, separated by at least 30 minutes from breakfast and other medications,  and separated by more than 4 hours from calcium, iron, multivitamins, acid reflux medications (PPIs). -Patient is made aware of the fact that thyroid hormone replacement is needed for life, dose to be adjusted by periodic monitoring of thyroid function tests.  6) Chronic Care/Health Maintenance: -she is not on ACEI/ARB or Statin medications due to multiple allergies and is encouraged to initiate and continue to follow up with Ophthalmology, Dentist, Podiatrist at least yearly or according to recommendations, and advised to  stay away from smoking. I have recommended  yearly flu vaccine and pneumonia vaccine at least every 5 years; moderate intensity exercise for up to 150 minutes weekly; and  sleep for at least 7 hours a day.  - she is advised to locate her PMD for primary care needs, as well as her other providers for optimal and coordinated care.      I spent  44  minutes in the care of the patient today including review of labs from CMP, Lipids, Thyroid Function, Hematology (current and previous including abstractions from other facilities); face-to-face time discussing  her blood glucose readings/logs, discussing hypoglycemia and hyperglycemia episodes and symptoms, medications doses, her options of short and long term treatment based on the latest standards of care / guidelines;  discussion about incorporating lifestyle medicine;  and documenting the encounter. Risk reduction counseling performed per USPSTF guidelines to reduce obesity and cardiovascular risk factors.     Please refer to Patient Instructions for Blood Glucose Monitoring and Insulin/Medications Dosing Guide  in media tab for  additional information. Please  also refer to  Patient Self Inventory in the Media  tab for reviewed elements of pertinent patient history.  Meade Birmingham participated in the discussions, expressed understanding, and voiced agreement with the above plans.  All questions were answered to her satisfaction. she is encouraged to contact clinic should she have any questions or concerns prior to her return visit.   Follow up plan: - Return in about 3 months (around 05/31/2024) for Diabetes F/U with A1c in office, Thyroid follow up, Previsit labs, Bring meter and logs.  Benton Rio, [facility] [address] [place], [place] [address] Phone: [phone] Fax: [phone]  03/02/2024, 2:41 PM   "

## 2024-06-13 ENCOUNTER — Ambulatory Visit: Admitting: Nurse Practitioner
